# Patient Record
Sex: Male | Born: 1950
Health system: Southern US, Community
[De-identification: ages and names within clinical notes are randomized; demographics above are authoritative.]

## PROBLEM LIST (undated history)

## (undated) DIAGNOSIS — Z8619 Personal history of other infectious and parasitic diseases: Secondary | ICD-10-CM

## (undated) DIAGNOSIS — I10 Essential (primary) hypertension: Secondary | ICD-10-CM

## (undated) DIAGNOSIS — R0683 Snoring: Secondary | ICD-10-CM

## (undated) DIAGNOSIS — I5022 Chronic systolic (congestive) heart failure: Secondary | ICD-10-CM

## (undated) DIAGNOSIS — I429 Cardiomyopathy, unspecified: Secondary | ICD-10-CM

## (undated) DIAGNOSIS — I272 Pulmonary hypertension, unspecified: Secondary | ICD-10-CM

## (undated) DIAGNOSIS — IMO0002 Reserved for concepts with insufficient information to code with codable children: Secondary | ICD-10-CM

## (undated) DIAGNOSIS — D696 Thrombocytopenia, unspecified: Secondary | ICD-10-CM

## (undated) DIAGNOSIS — R9430 Abnormal result of cardiovascular function study, unspecified: Secondary | ICD-10-CM

## (undated) DIAGNOSIS — B029 Zoster without complications: Secondary | ICD-10-CM

## (undated) HISTORY — DX: Snoring: R06.83

## (undated) HISTORY — DX: Cardiomyopathy, unspecified: I42.9

## (undated) HISTORY — DX: Thrombocytopenia, unspecified: D69.6

## (undated) HISTORY — DX: Zoster without complications: B02.9

## (undated) HISTORY — DX: Pulmonary hypertension, unspecified: I27.20

## (undated) HISTORY — PX: OTHER SURGICAL HISTORY: SHX169

## (undated) HISTORY — DX: Personal history of other infectious and parasitic diseases: Z86.19

## (undated) HISTORY — DX: Abnormal result of cardiovascular function study, unspecified: R94.30

## (undated) HISTORY — DX: Chronic systolic (congestive) heart failure: I50.22

## (undated) HISTORY — DX: Reserved for concepts with insufficient information to code with codable children: IMO0002

---

## 2001-07-04 ENCOUNTER — Emergency Department (HOSPITAL_COMMUNITY): Admission: EM | Admit: 2001-07-04 | Discharge: 2001-07-04 | Payer: Self-pay | Admitting: Emergency Medicine

## 2013-02-04 ENCOUNTER — Encounter (HOSPITAL_COMMUNITY): Payer: Self-pay | Admitting: Emergency Medicine

## 2013-02-04 ENCOUNTER — Inpatient Hospital Stay (HOSPITAL_COMMUNITY)
Admission: EM | Admit: 2013-02-04 | Discharge: 2013-02-08 | DRG: 292 | Disposition: A | Discharge status: Home / Self Care | Payer: Medicaid Other | Attending: Internal Medicine | Admitting: Internal Medicine

## 2013-02-04 ENCOUNTER — Emergency Department (HOSPITAL_COMMUNITY): Payer: Medicaid Other

## 2013-02-04 DIAGNOSIS — Z87891 Personal history of nicotine dependence: Secondary | ICD-10-CM

## 2013-02-04 DIAGNOSIS — Z91199 Patient's noncompliance with other medical treatment and regimen due to unspecified reason: Secondary | ICD-10-CM

## 2013-02-04 DIAGNOSIS — L02619 Cutaneous abscess of unspecified foot: Secondary | ICD-10-CM | POA: Diagnosis present

## 2013-02-04 DIAGNOSIS — D696 Thrombocytopenia, unspecified: Secondary | ICD-10-CM | POA: Diagnosis present

## 2013-02-04 DIAGNOSIS — I5021 Acute systolic (congestive) heart failure: Secondary | ICD-10-CM

## 2013-02-04 DIAGNOSIS — I252 Old myocardial infarction: Secondary | ICD-10-CM

## 2013-02-04 DIAGNOSIS — I4510 Unspecified right bundle-branch block: Secondary | ICD-10-CM

## 2013-02-04 DIAGNOSIS — Z9119 Patient's noncompliance with other medical treatment and regimen: Secondary | ICD-10-CM

## 2013-02-04 DIAGNOSIS — I509 Heart failure, unspecified: Secondary | ICD-10-CM | POA: Diagnosis present

## 2013-02-04 DIAGNOSIS — I519 Heart disease, unspecified: Secondary | ICD-10-CM | POA: Diagnosis present

## 2013-02-04 DIAGNOSIS — L0390 Cellulitis, unspecified: Secondary | ICD-10-CM

## 2013-02-04 DIAGNOSIS — I452 Bifascicular block: Secondary | ICD-10-CM | POA: Diagnosis present

## 2013-02-04 DIAGNOSIS — I110 Hypertensive heart disease with heart failure: Principal | ICD-10-CM | POA: Diagnosis present

## 2013-02-04 DIAGNOSIS — I5023 Acute on chronic systolic (congestive) heart failure: Secondary | ICD-10-CM | POA: Diagnosis present

## 2013-02-04 DIAGNOSIS — I10 Essential (primary) hypertension: Secondary | ICD-10-CM

## 2013-02-04 DIAGNOSIS — I420 Dilated cardiomyopathy: Secondary | ICD-10-CM

## 2013-02-04 DIAGNOSIS — G4733 Obstructive sleep apnea (adult) (pediatric): Secondary | ICD-10-CM | POA: Diagnosis present

## 2013-02-04 DIAGNOSIS — I272 Pulmonary hypertension, unspecified: Secondary | ICD-10-CM | POA: Diagnosis present

## 2013-02-04 DIAGNOSIS — E669 Obesity, unspecified: Secondary | ICD-10-CM | POA: Diagnosis present

## 2013-02-04 DIAGNOSIS — I428 Other cardiomyopathies: Secondary | ICD-10-CM | POA: Diagnosis present

## 2013-02-04 HISTORY — DX: Essential (primary) hypertension: I10

## 2013-02-04 LAB — COMPREHENSIVE METABOLIC PANEL
ALT: 25 U/L (ref 0–53)
AST: 22 U/L (ref 0–37)
Albumin: 3.2 g/dL — ABNORMAL LOW (ref 3.5–5.2)
Alkaline Phosphatase: 97 U/L (ref 39–117)
BUN: 18 mg/dL (ref 6–23)
CO2: 29 mEq/L (ref 19–32)
Chloride: 108 mEq/L (ref 96–112)
GFR calc Af Amer: 82 mL/min — ABNORMAL LOW (ref >90)
GFR calc non Af Amer: 71 mL/min — ABNORMAL LOW (ref >90)
Glucose, Bld: 123 mg/dL — ABNORMAL HIGH (ref 70–99)
Potassium: 4 mEq/L (ref 3.5–5.1)
Total Bilirubin: 0.7 mg/dL (ref 0.3–1.2)

## 2013-02-04 LAB — PRO B NATRIURETIC PEPTIDE: Pro B Natriuretic peptide (BNP): 9137 pg/mL — ABNORMAL HIGH (ref 0–125)

## 2013-02-04 LAB — CBC WITH DIFFERENTIAL/PLATELET
HCT: 41 % (ref 39.0–52.0)
Hemoglobin: 12.9 g/dL — ABNORMAL LOW (ref 13.0–17.0)
Lymphocytes Relative: 13 % (ref 12–46)
Lymphs Abs: 0.6 10*3/uL — ABNORMAL LOW (ref 0.7–4.0)
MCH: 25.8 pg — ABNORMAL LOW (ref 26.0–34.0)
Monocytes Absolute: 0.4 10*3/uL (ref 0.1–1.0)
Monocytes Relative: 10 % (ref 3–12)
Neutro Abs: 3.1 10*3/uL (ref 1.7–7.7)
Neutrophils Relative %: 75 % (ref 43–77)
RBC: 5 MIL/uL (ref 4.22–5.81)
WBC: 4.1 10*3/uL (ref 4.0–10.5)

## 2013-02-04 LAB — POCT I-STAT TROPONIN I: Troponin i, poc: 0.02 ng/mL (ref 0.00–0.08)

## 2013-02-04 MED ORDER — FUROSEMIDE 10 MG/ML IJ SOLN
40.0000 mg | Freq: Once | INTRAMUSCULAR | Status: AC
  Administered 2013-02-04: 40 mg via INTRAVENOUS
  Filled 2013-02-04: qty 4

## 2013-02-04 MED ORDER — ASPIRIN 325 MG PO TABS
325.0000 mg | ORAL_TABLET | Freq: Every day | ORAL | Status: DC
  Administered 2013-02-05 – 2013-02-08 (×5): 325 mg via ORAL
  Filled 2013-02-04 (×5): qty 1

## 2013-02-04 MED ORDER — LABETALOL HCL 5 MG/ML IV SOLN: 10.0000 mg | Freq: Once | INTRAVENOUS | Status: DC

## 2013-02-04 NOTE — ED Notes (Signed)
Patient transported from xray 

## 2013-02-04 NOTE — ED Notes (Signed)
MD at bedside. 

## 2013-02-04 NOTE — ED Provider Notes (Signed)
CSN: [id number]     Arrival date & time 02/04/13  2115 History   First MD Initiated Contact with Patient 02/04/13 2150     Chief Complaint  Patient presents with  . Shortness of Breath   (Consider location/radiation/quality/duration/timing/severity/associated sxs/prior Treatment) Patient is a 62 y.o. male presenting with shortness of breath.  Shortness of Breath Severity:  Moderate Onset quality:  Gradual Duration:  3 weeks Timing:  Constant Progression:  Worsening Chronicity:  New Context: activity   Relieved by:  Nothing Associated symptoms: no abdominal pain, no chest pain, no cough, no diaphoresis, no fever, no headaches, no neck pain, no rash, no sore throat and no vomiting   Risk factors: obesity     Past Medical History  Diagnosis Date  . Hypertension    History reviewed. No pertinent past surgical history. History reviewed. No pertinent family history. History  Substance Use Topics  . Smoking status: Former Games developer  . Smokeless tobacco: Not on file  . Alcohol Use: No    Review of Systems  Constitutional: Negative for fever, chills and diaphoresis.  HENT: Negative for sore throat.   Eyes: Negative for pain.  Respiratory: Positive for shortness of breath. Negative for cough.   Cardiovascular: Positive for leg swelling. Negative for chest pain.  Gastrointestinal: Positive for abdominal distention. Negative for nausea, vomiting and abdominal pain.  Genitourinary: Negative for dysuria and flank pain.  Musculoskeletal: Negative for back pain and neck pain.  Skin: Negative for rash.  Neurological: Negative for seizures and headaches.    Allergies  Review of patient's allergies indicates no known allergies.  Home Medications   Current Outpatient Rx  Name  Route  Sig  Dispense  Refill  . naproxen sodium (ANAPROX) 220 MG tablet   Oral   Take 440 mg by mouth daily as needed (pain).          BP 166/84  Pulse 82  Temp(Src) 98.6 F (37 C) (Oral)  Resp 26   Wt 250 lb (113.399 kg)  SpO2 100% Physical Exam  Constitutional: He is oriented to person, place, and time. He appears well-developed and well-nourished. No distress.  HENT:  Head: Normocephalic and atraumatic.  Eyes: Pupils are equal, round, and reactive to light.  Neck: Normal range of motion.  Cardiovascular: Normal rate and regular rhythm.  Exam reveals distant heart sounds.   Pulmonary/Chest: Effort normal. Not tachypneic and not bradypneic. He has rales in the right lower field and the left lower field.  Abdominal: Soft. He exhibits distension. There is no tenderness.  Musculoskeletal: Normal range of motion.  Neurological: He is alert and oriented to person, place, and time.  Skin: Skin is warm. He is not diaphoretic.    ED Course  Procedures (including critical care time) Labs Review Labs Reviewed  PRO B NATRIURETIC PEPTIDE - Abnormal; Notable for the following:    Pro B Natriuretic peptide (BNP) 9137.0 (*)    All other components within normal limits  CBC WITH DIFFERENTIAL - Abnormal; Notable for the following:    Hemoglobin 12.9 (*)    MCH 25.8 (*)    RDW 15.9 (*)    Platelets 91 (*)    Lymphs Abs 0.6 (*)    All other components within normal limits  COMPREHENSIVE METABOLIC PANEL - Abnormal; Notable for the following:    Glucose, Bld 123 (*)    Albumin 3.2 (*)    GFR calc non Af Amer 71 (*)    GFR calc Af Amer 82 (*)  All other components within normal limits  POCT I-STAT TROPONIN I   Imaging Review Dg Chest 2 View  02/04/2013   CLINICAL DATA:  Shortness of breath, cough.  EXAM: CHEST  2 VIEW  COMPARISON:  None.  FINDINGS: Mild cardiomegaly is noted. Elevated right hemidiaphragm is noted with probable associated mild pleural effusion. Right lower lobe opacity is noted concerning for subsegmental atelectasis or pneumonia. No pneumothorax is noted. Bony thorax is intact.  IMPRESSION: Right basilar opacity consistent with pneumonia or subsegmental atelectasis with  associated pleural effusion.   Electronically Signed   By: Roque Lias M.D.   On: 02/04/2013 22:00    EKG Interpretation   None       MDM   1. CHF exacerbation    62 yo M with hx of HTN presents with 3 weeks of DOE, shortness of breath, leg swelling, concerning for new onset CHF.   PE as above, significant for distant heart sounds and bilateral leg swelling. Will obtain labs to check for CHF.   Patient with increased BNP, mild cardiomegaly, concerning for new onset CHF. Hospitalist consulted for admission for continued workup. Will treat with IV lasix, BB, aspirin, will admit. Admitted to floor in stable condition. Patient seen and evaluated by myself and my attending, Dr. Rubin Payor.        Imagene Sheller, MD 02/05/13 [phone]

## 2013-02-04 NOTE — ED Notes (Signed)
Pt c/o shortness of breath with exertion x's 2-3 weeks.  St's has had bil leg edema over past 2 weeks.  PT denies any chest pain

## 2013-02-04 NOTE — ED Notes (Signed)
Patient transported to X-ray 

## 2013-02-05 ENCOUNTER — Encounter (HOSPITAL_COMMUNITY): Payer: Self-pay | Admitting: Internal Medicine

## 2013-02-05 DIAGNOSIS — I10 Essential (primary) hypertension: Secondary | ICD-10-CM | POA: Diagnosis present

## 2013-02-05 DIAGNOSIS — G4733 Obstructive sleep apnea (adult) (pediatric): Secondary | ICD-10-CM | POA: Diagnosis present

## 2013-02-05 DIAGNOSIS — E669 Obesity, unspecified: Secondary | ICD-10-CM | POA: Diagnosis present

## 2013-02-05 DIAGNOSIS — Z9119 Patient's noncompliance with other medical treatment and regimen: Secondary | ICD-10-CM

## 2013-02-05 DIAGNOSIS — I509 Heart failure, unspecified: Secondary | ICD-10-CM | POA: Diagnosis present

## 2013-02-05 DIAGNOSIS — I059 Rheumatic mitral valve disease, unspecified: Secondary | ICD-10-CM

## 2013-02-05 MED ORDER — LISINOPRIL 20 MG PO TABS
20.0000 mg | ORAL_TABLET | Freq: Every day | ORAL | Status: DC
  Administered 2013-02-06 – 2013-02-08 (×3): 20 mg via ORAL
  Filled 2013-02-05 (×4): qty 1

## 2013-02-05 MED ORDER — ONDANSETRON HCL 4 MG/2ML IJ SOLN: 4.0000 mg | INTRAMUSCULAR | Status: DC | PRN

## 2013-02-05 MED ORDER — SENNOSIDES-DOCUSATE SODIUM 8.6-50 MG PO TABS
1.0000 | ORAL_TABLET | Freq: Two times a day (BID) | ORAL | Status: DC
  Administered 2013-02-05 – 2013-02-08 (×5): 1 via ORAL
  Filled 2013-02-05 (×8): qty 1

## 2013-02-05 MED ORDER — CIPROFLOXACIN IN D5W 400 MG/200ML IV SOLN
400.0000 mg | Freq: Two times a day (BID) | INTRAVENOUS | Status: DC
  Administered 2013-02-05 – 2013-02-07 (×5): 400 mg via INTRAVENOUS
  Filled 2013-02-05 (×6): qty 200

## 2013-02-05 MED ORDER — PNEUMOCOCCAL VAC POLYVALENT 25 MCG/0.5ML IJ INJ
0.5000 mL | INJECTION | Freq: Once | INTRAMUSCULAR | Status: AC
  Administered 2013-02-05: 11:00:00 0.5 mL via INTRAMUSCULAR
  Filled 2013-02-05: qty 0.5

## 2013-02-05 MED ORDER — CARVEDILOL 6.25 MG PO TABS
6.2500 mg | ORAL_TABLET | Freq: Two times a day (BID) | ORAL | Status: DC
  Administered 2013-02-05 – 2013-02-08 (×6): 6.25 mg via ORAL
  Filled 2013-02-05 (×7): qty 1

## 2013-02-05 MED ORDER — HYDRALAZINE HCL 20 MG/ML IJ SOLN: 10.0000 mg | Freq: Four times a day (QID) | INTRAMUSCULAR | Status: DC | PRN

## 2013-02-05 MED ORDER — LISINOPRIL 10 MG PO TABS
10.0000 mg | ORAL_TABLET | Freq: Every day | ORAL | Status: DC
  Administered 2013-02-05: 11:00:00 10 mg via ORAL
  Filled 2013-02-05: qty 1

## 2013-02-05 MED ORDER — ONDANSETRON HCL 4 MG PO TABS: 4.0000 mg | ORAL_TABLET | ORAL | Status: DC | PRN

## 2013-02-05 MED ORDER — HEPARIN SODIUM (PORCINE) 5000 UNIT/ML IJ SOLN
5000.0000 [IU] | Freq: Two times a day (BID) | INTRAMUSCULAR | Status: DC
  Administered 2013-02-05 – 2013-02-06 (×3): 5000 [IU] via SUBCUTANEOUS
  Filled 2013-02-05 (×4): qty 1

## 2013-02-05 MED ORDER — PERFLUTREN LIPID MICROSPHERE
1.0000 mL | INTRAVENOUS | Status: AC | PRN
  Administered 2013-02-05: 2 mL via INTRAVENOUS
  Filled 2013-02-05: qty 10

## 2013-02-05 MED ORDER — FUROSEMIDE 10 MG/ML IJ SOLN
40.0000 mg | Freq: Two times a day (BID) | INTRAMUSCULAR | Status: DC
  Administered 2013-02-05 – 2013-02-07 (×5): 40 mg via INTRAVENOUS
  Filled 2013-02-05 (×8): qty 4

## 2013-02-05 MED ORDER — LABETALOL HCL 5 MG/ML IV SOLN
20.0000 mg | Freq: Once | INTRAVENOUS | Status: AC
  Administered 2013-02-05: 20 mg via INTRAVENOUS
  Filled 2013-02-05: qty 4

## 2013-02-05 MED ORDER — INFLUENZA VIRUS VACC SPLIT PF IM SUSP
0.5000 mL | Freq: Once | INTRAMUSCULAR | Status: AC
  Administered 2013-02-05: 10:00:00 0.5 mL via INTRAMUSCULAR
  Filled 2013-02-05: qty 0.5

## 2013-02-05 MED ORDER — POTASSIUM CHLORIDE ER 10 MEQ PO TBCR
10.0000 meq | EXTENDED_RELEASE_TABLET | Freq: Every day | ORAL | Status: DC
  Administered 2013-02-05 – 2013-02-08 (×4): 10 meq via ORAL
  Filled 2013-02-05 (×4): qty 1

## 2013-02-05 MED ORDER — CARVEDILOL 3.125 MG PO TABS
3.1250 mg | ORAL_TABLET | Freq: Two times a day (BID) | ORAL | Status: DC
  Administered 2013-02-05: 3.125 mg via ORAL
  Filled 2013-02-05 (×2): qty 1

## 2013-02-05 NOTE — Progress Notes (Signed)
Echocardiogram 2D Echocardiogram has been performed.  Jesse Torres 02/05/2013, 3:17 PM

## 2013-02-05 NOTE — Progress Notes (Signed)
[facility] PROGRESS NOTE    Jesse Torres ION:[id number] DOB: 1950-08-31 DOA: 02/04/2013 PCP: No PCP Per Patient  HPI/Brief narrative 62 y.o. male with hx of severe HTN, noncompliance with medical care due to lack of insurance, obesity, loud snoring, prior tobacco use, presents to the ER with several weeks of shortness of breath, orthopnea, PND, and peripheral edema. Evalation in the ER included a CXR which showed ? Infiltrate vs atelectasis with pleural effusion, BNP of 9137, normal WBC and renal fx tests, with normal LFTs. He also has mild redness and tenderness of his lower extremities. His EKG showed no acute ST T changes, and he is in SR with bifascicular blocks. He was found to be quite hypertensive in the ER, with BP 180/110. IV Lasix, oxygen, and IV labetelol were given with improvement of his symptoms. Hospitalist was asked to admit him for new onset of CHF. He has no chest pain.  Assessment/Plan:  Acute CHF, unknown type (no prior echo) - Etiology: Possible hypertensive heart disease/cardiomyopathy, rule out sleep apnea - Patient admitted to telemetry and started on IV Lasix, low dose beta blockers, ACE inhibitor and nitrates. - Clinically improved but still significantly volume overloaded. - Followup 2-D echo  Uncontrolled hypertension - Titrate carvedilol and lisinopril and monitor.  Possible cellulitis of both feet - Continue empiric Cipro.  Rule out sleep apnea - Will need formal sleep study as outpatient.  Thrombocytopenia - Follow CBCs   Code Status: Full Family Communication: Discussed with daughter at bedside. Disposition Plan: Home when medically stable   Consultants:  None  Procedures:  None  Antibiotics:  IV Cipro 12/24 >   Subjective: States that he feels significantly better. Denies dyspnea or chest pain. Indicates that leg swellings on improving.  Objective: Filed Vitals:   02/05/13 0315 02/05/13 0324 02/05/13 0415 02/05/13 1548   BP: 142/73  157/75 174/106  Pulse: 65  73 75  Temp:  98 F (36.7 C) 97.5 F (36.4 C) 97.5 F (36.4 C)  TempSrc:  Oral Oral Oral  Resp: 22  18 17   Height:   5\' 7"  (1.702 m)   Weight:   109.7 kg (241 lb 13.5 oz)   SpO2: 100% 100% 95% 95%    Intake/Output Summary (Last 24 hours) at 02/05/13 1614 Last data filed at 02/05/13 1550  Gross per 24 hour  Intake    580 ml  Output   3275 ml  Net  -2695 ml   Filed Weights   02/04/13 2128 02/05/13 0415  Weight: 113.399 kg (250 lb) 109.7 kg (241 lb 13.5 oz)     Exam:  General exam: Middle-aged male, obese lying comfortably supine in bed. Respiratory system: Occasional basal crackles but otherwise rest of lung fields are clear to auscultation. No increased work of breathing. Cardiovascular system: S1 & S2 heard, RRR. No JVD, murmurs, gallops, clicks. 2+ pitting bilateral leg edema with mild erythema without warmth or tenderness of bilateral feet. Gastrointestinal system: Abdomen is nondistended, soft and nontender. Normal bowel sounds heard. Central nervous system: Alert and oriented. No focal neurological deficits. Extremities: Symmetric 5 x 5 power.   Data Reviewed: Basic Metabolic Panel:  Recent Labs Lab 02/04/13 2216  NA 144  K 4.0  CL 108  CO2 29  GLUCOSE 123*  BUN 18  CREATININE 1.09  CALCIUM 8.8   Liver Function Tests:  Recent Labs Lab 02/04/13 2216  AST 22  ALT 25  ALKPHOS 97  BILITOT 0.7  PROT 6.1  ALBUMIN 3.2*  No results found for this basename: LIPASE, AMYLASE,  in the last 168 hours No results found for this basename: AMMONIA,  in the last 168 hours CBC:  Recent Labs Lab 02/04/13 2216  WBC 4.1  NEUTROABS 3.1  HGB 12.9*  HCT 41.0  MCV 82.0  PLT 91*   Cardiac Enzymes: No results found for this basename: CKTOTAL, CKMB, CKMBINDEX, TROPONINI,  in the last 168 hours BNP (last 3 results)  Recent Labs  02/04/13 2216  PROBNP 9137.0*   CBG: No results found for this basename: GLUCAP,  in  the last 168 hours  No results found for this or any previous visit (from the past 240 hour(s)).    Additional labs: 1. None     Studies: Dg Chest 2 View  02/04/2013   CLINICAL DATA:  Shortness of breath, cough.  EXAM: CHEST  2 VIEW  COMPARISON:  None.  FINDINGS: Mild cardiomegaly is noted. Elevated right hemidiaphragm is noted with probable associated mild pleural effusion. Right lower lobe opacity is noted concerning for subsegmental atelectasis or pneumonia. No pneumothorax is noted. Bony thorax is intact.  IMPRESSION: Right basilar opacity consistent with pneumonia or subsegmental atelectasis with associated pleural effusion.   Electronically Signed   By: Roque Lias M.D.   On: 02/04/2013 22:00        Scheduled Meds: . aspirin  325 mg Oral Daily  . carvedilol  3.125 mg Oral BID  . ciprofloxacin  400 mg Intravenous Q12H  . furosemide  40 mg Intravenous Q12H  . heparin  5,000 Units Subcutaneous Q12H  . lisinopril  10 mg Oral Daily  . potassium chloride  10 mEq Oral Daily  . senna-docusate  1 tablet Oral BID   Continuous Infusions:   Principal Problem:   CHF with unknown LVEF Active Problems:   HTN (hypertension)   Sleep apnea, obstructive   Obesity   History of noncompliance with medical treatment    Time spent: 25 minutes.    Marcellus Scott, MD, FACP, FHM. [facility] Pager [phone]  If 7PM-7AM, please contact night-coverage [email or web address] Password [email or web address] 02/05/2013, 4:14 PM    LOS: 1 day

## 2013-02-05 NOTE — Plan of Care (Signed)
Problem: Phase I Progression Outcomes Goal: EF % per last Echo/documented,Core Reminder form on chart Outcome: Completed/Met Date Met:  02/05/13 pts EF from 02/05/2013 25-30%

## 2013-02-05 NOTE — H&P (Signed)
[facility] History and Physical  Jesse Torres ZOX:[id number] DOB: [birth year]    PCP:   NONE.  Chief Complaint: shortness of breath for the past several weeks.  HPI: Jesse Torres is an 62 y.o. male with hx of severe HTN, noncompliance with medical care due to lack of insurance, obesity, loud snoring, prior tobacco use, presents to the ER with several weeks of shortness of breath, orthopnea, PND, and peripheral edema.  Evalation in the ER included a CXR which showed ? Infiltrate vs atelectasis with pleural effusion, BNP of 9137, normal WBC and renal fx tests, with normal LFTs.  He also has mild redness and tenderness of his lower extremities.  His EKG showed no acute ST T changes, and he is in SR with bifascicular blocks.  He was found to be quite hypertensive in the ER, with BP 180/110.  IV Lasix, oxygen, and IV labetelol were given with improvement of his symptoms.  Hospitalist was asked to admit him for new onset of CHF.  He has no chest pain.  Rewiew of Systems:  Constitutional: Negative for malaise, fever and chills. No significant weight loss or weight gain Eyes: Negative for eye pain, redness and discharge, diplopia, visual changes, or flashes of light. ENMT: Negative for ear pain, hoarseness, nasal congestion, sinus pressure and sore throat. No headaches; tinnitus, drooling, or problem swallowing. Cardiovascular: Negative for chest pain, palpitations, diaphoresis, Respiratory: Negative for cough, hemoptysis, wheezing and stridor. No pleuritic chestpain. Gastrointestinal: Negative for nausea, vomiting, diarrhea, constipation, abdominal pain, melena, blood in stool, hematemesis, jaundice and rectal bleeding.    Genitourinary: Negative for frequency, dysuria, incontinence,flank pain and hematuria; Musculoskeletal: Negative for back pain and neck pain. Negative for trauma.;  Skin: . Negative for pruritus, rash, abrasions, bruising and skin lesion.; ulcerations Neuro: Negative for  headache, lightheadedness and neck stiffness. Negative for weakness, altered level of consciousness , altered mental status, extremity weakness, burning feet, involuntary movement, seizure and syncope.  Psych: negative for anxiety, depression, insomnia, tearfulness, panic attacks, hallucinations, paranoia, suicidal or homicidal ideation    Past Medical History  Diagnosis Date  . Hypertension     History reviewed. No pertinent past surgical history.  Medications:  HOME MEDS: Prior to Admission medications   Medication Sig Start Date End Date Taking? Authorizing Provider  naproxen sodium (ANAPROX) 220 MG tablet Take 440 mg by mouth daily as needed (pain).   Yes Historical Provider, MD     Allergies:  No Known Allergies  Social History:   reports that he has quit smoking. He does not have any smokeless tobacco history on file. He reports that he does not drink alcohol or use illicit drugs.  Family History: History reviewed. No pertinent family history.   Physical Exam: Filed Vitals:   02/04/13 2300 02/04/13 2315 02/05/13 0000 02/05/13 0044  BP: 162/92 155/96 164/91 128/54  Pulse: 82 81 85 66  Temp:      TempSrc:      Resp: 21 32 25 20  Weight:      SpO2: 100% 100% 99% 100%   Blood pressure 128/54, pulse 66, temperature 98.6 F (37 C), temperature source Oral, resp. rate 20, weight 113.399 kg (250 lb), SpO2 100.00%.  GEN:  Pleasant patient lying in the stretcher in no acute distress; cooperative with exam. PSYCH:  alert and oriented x4; does not appear anxious or depressed; affect is appropriate. HEENT: Mucous membranes pink and anicteric; PERRLA; EOM intact; no cervical lymphadenopathy nor thyromegaly or carotid bruit; +JVD; There  were no stridor. Neck is very supple. Breasts:: Not examined CHEST WALL: No tenderness CHEST: Normal respiration, no wheezing, but bisilar rales, decrease BS. HEART: Regular rate and rhythm.  There are no murmur, rub, or gallops.   BACK: No  kyphosis or scoliosis; no CVA tenderness ABDOMEN: soft and non-tender; no masses, no organomegaly, normal abdominal bowel sounds; no pannus; no intertriginous candida. There is no rebound and no distention. Obse abdomen. Rectal Exam: Not done EXTREMITIES: No bone or joint deformity; age-appropriate arthropathy of the hands and knees;no ulcerations.  There is no calf tenderness. Genitalia: not examined PULSES: 2+ and symmetric SKIN: Normal hydration.  He has increased redness of his lower extremities CNS: Cranial nerves 2-12 grossly intact no focal lateralizing neurologic deficit.  Speech is fluent; uvula elevated with phonation, facial symmetry and tongue midline. DTR are normal bilaterally, cerebella exam is intact, barbinski is negative and strengths are equaled bilaterally.  No sensory loss.   Labs on Admission:  Basic Metabolic Panel:  Recent Labs Lab 02/04/13 2216  NA 144  K 4.0  CL 108  CO2 29  GLUCOSE 123*  BUN 18  CREATININE 1.09  CALCIUM 8.8   Liver Function Tests:  Recent Labs Lab 02/04/13 2216  AST 22  ALT 25  ALKPHOS 97  BILITOT 0.7  PROT 6.1  ALBUMIN 3.2*   No results found for this basename: LIPASE, AMYLASE,  in the last 168 hours No results found for this basename: AMMONIA,  in the last 168 hours CBC:  Recent Labs Lab 02/04/13 2216  WBC 4.1  NEUTROABS 3.1  HGB 12.9*  HCT 41.0  MCV 82.0  PLT 91*   Cardiac Enzymes: No results found for this basename: CKTOTAL, CKMB, CKMBINDEX, TROPONINI,  in the last 168 hours  CBG: No results found for this basename: GLUCAP,  in the last 168 hours   Radiological Exams on Admission: Dg Chest 2 View  02/04/2013   CLINICAL DATA:  Shortness of breath, cough.  EXAM: CHEST  2 VIEW  COMPARISON:  None.  FINDINGS: Mild cardiomegaly is noted. Elevated right hemidiaphragm is noted with probable associated mild pleural effusion. Right lower lobe opacity is noted concerning for subsegmental atelectasis or pneumonia. No  pneumothorax is noted. Bony thorax is intact.  IMPRESSION: Right basilar opacity consistent with pneumonia or subsegmental atelectasis with associated pleural effusion.   Electronically Signed   By: Roque Lias M.D.   On: 02/04/2013 22:00    EKG: Independently reviewed. SR no acute ST T changes.  He has bifascicular block.   Assessment/Plan Present on Admission:  . CHF with unknown LVEF . HTN (hypertension) . Sleep apnea, obstructive . Obesity  PLAN:  I suspect his CHF is on the bases of HTN CMP.  He likely has sleep apnea as well given his BMI along with his hx of loud snoring.  Will admit him to telemetry.  Start low dose betablocker, ACE-I, and nitrate to control his BP.  At some point, he will need to have a polysomnogram as well.  Will obtain daily weight and strict I/O.  He will need an ECHO with contrast. Clinically, he did not present like PNA, so no antibiotics will be started.  I discussed long term complication of HTN and sleep apnea, and he expressed understanding.  He also has cellulitis of both feet and will be given IV antibiotics.  He is stable, full code, and will be admitted to [facility] service.  Thank you for allowing me to participate in the  care of your nice patient.  Other plans as per orders.  Code Status: FULL Unk Lightning, MD. [facility] Pager [phone] 7pm to 7am.  02/05/2013, 1:09 AM

## 2013-02-05 NOTE — Plan of Care (Signed)
Problem: Food- and Nutrition-Related Knowledge Deficit (NB-1.1) Goal: Nutrition education Formal process to instruct or train a patient/client in a skill or to impart knowledge to help patients/clients voluntarily manage or modify food choices and eating behavior to maintain or improve health. Outcome: Completed/Met Date Met:  02/05/13 Nutrition Education Note  RD consulted for nutrition education regarding CHF.  RD provided "Low Sodium Nutrition Therapy" handout from the Academy of Nutrition and Dietetics. Reviewed patient's dietary recall. Provided examples on ways to decrease sodium intake in diet. Discouraged intake of processed foods and use of salt shaker. Encouraged fresh fruits and vegetables as well as whole grain sources of carbohydrates to maximize fiber intake.   RD discussed why it is important for patient to adhere to diet recommendations, and emphasized the role of fluids, foods to avoid, and importance of weighing self daily. Teach back method used.  Expect fair compliance.  Body mass index is 37.87 kg/(m^2). Pt meets criteria for Obese Class II based on current BMI.  Current diet order is Heart Healthy, patient is consuming approximately 100% of meals at this time. Labs and medications reviewed. No further nutrition interventions warranted at this time. RD contact information provided. If additional nutrition issues arise, please re-consult RD.   Jarold Motto MS, RD, LDN Pager: [phone] After-hours pager: [phone]

## 2013-02-05 NOTE — Care Management Note (Addendum)
  Page 2 of 2   02/07/2013     11:15:13 AM   CARE MANAGEMENT NOTE 02/07/2013  Patient:  Jesse Torres,Jesse Torres   Account Number:  [id number]  Date Initiated:  02/05/2013  Documentation initiated by:  [facility]  Subjective/Objective Assessment:   62 y.o. male with hx of severe HTN, noncompliance with medical care due to lack of insurance, obesity, loud snoring, prior tobacco use, presents to the ER with several weeks of SOB, orthopnea, PND, and peripheral edema. //Home alone     Action/Plan:   IV diureses//Home with HH   Anticipated DC Date:  02/08/2013   Anticipated DC Plan:  HOME W HOME HEALTH SERVICES  In-house referral  Clinical Social Worker  Artist      DC Planning Services  CM consult  Other  Medication Assistance      Choice offered to / List presented to:             Status of service:  Completed, signed off Medicare Important Message given?   (If response is "NO", the following Medicare IM given date fields will be blank) Date Medicare IM given:   Date Additional Medicare IM given:    Discharge Disposition:    Per UR Regulation:  Reviewed for med. necessity/level of care/duration of stay  If discussed at Long Length of Stay Meetings, dates discussed:    Comments:  02/07/2013  Jesse Scheu RN, BSN, MSHL, CCM MCD Pending:  [facility] [employer], Marcelino Duster has met with patient and discussed MCD and Disability applications. Med Assist:   Patient confirms ability to cover medication cost post d/c home.  Currently most meds are on $4.00 list. PCP:  Initial appt scheduled 03/11/2012  Tuesday, 10:30am [facility] [address] [place] [address] [phone] Patient confirms ability to self manage all ADLs  and no DME needs.  Hx/o separated x 1 year but has supportive children living close by.  Patient states ability to self manage his own meds.  No further needs identified at this time. CM will continue to  monitor for disposition needs.     02/06/23 1015 Jesse Wood, RN, BSN, [place] [phone] Follow up with [facility] on 03/11/2013. (Tuesday @10 :30 AM).  NCM will continue to follow for medication needs and possible MATCH assistance.

## 2013-02-05 NOTE — Progress Notes (Signed)
Pt a/o, no c/o pain, pt educated on diet and doing daily weights, pt stable, will continue to monitor

## 2013-02-05 NOTE — Progress Notes (Signed)
Follow up with [facility] On 03/11/2013. (Tuesday @10 :30 AM)

## 2013-02-06 DIAGNOSIS — I4510 Unspecified right bundle-branch block: Secondary | ICD-10-CM

## 2013-02-06 DIAGNOSIS — I272 Pulmonary hypertension, unspecified: Secondary | ICD-10-CM | POA: Diagnosis present

## 2013-02-06 DIAGNOSIS — L0291 Cutaneous abscess, unspecified: Secondary | ICD-10-CM

## 2013-02-06 DIAGNOSIS — I519 Heart disease, unspecified: Secondary | ICD-10-CM | POA: Diagnosis present

## 2013-02-06 DIAGNOSIS — I5021 Acute systolic (congestive) heart failure: Secondary | ICD-10-CM

## 2013-02-06 DIAGNOSIS — I509 Heart failure, unspecified: Secondary | ICD-10-CM | POA: Diagnosis present

## 2013-02-06 LAB — BASIC METABOLIC PANEL
Calcium: 8.8 mg/dL (ref 8.4–10.5)
Creatinine, Ser: 1.21 mg/dL (ref 0.50–1.35)
GFR calc Af Amer: 72 mL/min — ABNORMAL LOW (ref >90)
Potassium: 3.8 mEq/L (ref 3.5–5.1)

## 2013-02-06 LAB — CBC
Hemoglobin: 14 g/dL (ref 13.0–17.0)
MCH: 25.6 pg — ABNORMAL LOW (ref 26.0–34.0)
Platelets: DECREASED 10*3/uL (ref 150–400)
RDW: 16.1 % — ABNORMAL HIGH (ref 11.5–15.5)
WBC: 3.5 10*3/uL — ABNORMAL LOW (ref 4.0–10.5)

## 2013-02-06 LAB — GLUCOSE, CAPILLARY: Glucose-Capillary: 153 mg/dL — ABNORMAL HIGH (ref 70–99)

## 2013-02-06 LAB — HEMOGLOBIN A1C
Hgb A1c MFr Bld: 6 % — ABNORMAL HIGH (ref <5.7)
Mean Plasma Glucose: 126 mg/dL — ABNORMAL HIGH (ref <117)

## 2013-02-06 MED ORDER — HEPARIN SODIUM (PORCINE) 5000 UNIT/ML IJ SOLN
5000.0000 [IU] | Freq: Three times a day (TID) | INTRAMUSCULAR | Status: DC
  Administered 2013-02-06 – 2013-02-08 (×6): 5000 [IU] via SUBCUTANEOUS
  Filled 2013-02-06 (×9): qty 1

## 2013-02-06 NOTE — Progress Notes (Signed)
Pt a/o, no c/o pain, pt verbalized understanding of daily weights and low sodium diet, vss, pt stable, will continue to monitor

## 2013-02-06 NOTE — Consult Note (Addendum)
CARDIOLOGY CONSULT NOTE   Patient ID: Jesse Torres MRN: [id number] DOB/AGE: 08/29/1950 62 y.o.  Admit Date: 02/04/2013  Primary Physician: Jesse Torres [place] [facility]    Jesse Torres   Clinical Summary Mr. Jesse Torres is a 62 y.o.male. He is admitted with congestive heart failure. He has significant hypertensive disease. He is improving with treatment. There is a history of noncompliance. We do not have a lot of old information in the current medical record. Echo has shown an ejection fraction of 25-30%. There is also right ventricular dysfunction that is moderate. There is also moderately severe pulmonary hypertension with a PA pressure of 64 mm of mercury. The patient has been nicely and evaluated and treated since admission by the primary team. He is diuresing.     No Known Allergies  Medications Scheduled Medications: . aspirin  325 mg Oral Daily  . carvedilol  6.25 mg Oral BID  . ciprofloxacin  400 mg Intravenous Q12H  . furosemide  40 mg Intravenous Q12H  . heparin  5,000 Units Subcutaneous Q12H  . lisinopril  20 mg Oral Daily  . potassium chloride  10 mEq Oral Daily  . senna-docusate  1 tablet Oral BID     Infusions:     PRN Medications:  hydrALAZINE, ondansetron (ZOFRAN) IV, ondansetron   Past Medical History  Diagnosis Date  . Hypertension     History reviewed. No pertinent past surgical history.  History reviewed. No pertinent family history.  Social History Mr. Jesse Torres reports that he quit smoking about 18 years ago. His smoking use included Cigarettes. He smoked 0.00 packs per day. He does not have any smokeless tobacco history on file. Mr. Jesse Torres reports that he does not drink alcohol.  Review of Systems  Patient denies fever, chills, headache, sweats, rash, change in vision, change in hearing, chest pain, cough, nausea vomiting, urinary symptoms. All other systems are reviewed and are negative.   Physical Examination Blood  pressure 123/60, pulse 75, temperature 97.7 F (36.5 C), temperature source Oral, resp. rate 18, height 5\' 7"  (1.702 m), weight 232 lb 14.4 oz (105.643 kg), SpO2 96.00%. The patient is oriented to person time and place. Affect is normal. There is no jugular venous distention. Lungs reveal scattered rhonchi and a few rales. There is no respiratory distress. Cardiac exam reveals S1 and S2. The abdomen is soft. There is 2+ peripheral edema. There are no musculoskeletal deformities. There are no skin rashes.     Intake/Output Summary (Last 24 hours) at 02/06/13 1106 Last data filed at 02/06/13 7062  Gross per 24 hour  Intake   1002 ml  Output   2550 ml  Net  -1548 ml    Telemetry:  I have reviewed telemetry today February 06, 2013. There is normal sinus rhythm.    Prior Cardiac Testing/Procedures  Lab Results  Basic Metabolic Panel:  Recent Labs Lab 02/04/13 2216 02/06/13 0510  NA 144 144  K 4.0 3.8  CL 108 104  CO2 29 31  GLUCOSE 123* 119*  BUN 18 18  CREATININE 1.09 1.21  CALCIUM 8.8 8.8    Liver Function Tests:  Recent Labs Lab 02/04/13 2216  AST 22  ALT 25  ALKPHOS 97  BILITOT 0.7  PROT 6.1  ALBUMIN 3.2*    CBC:  Recent Labs Lab 02/04/13 2216 02/06/13 0510  WBC 4.1 3.5*  NEUTROABS 3.1  --   HGB 12.9* 14.0  HCT 41.0 45.6  MCV 82.0 83.5  PLT  91* PLATELET CLUMPS NOTED ON SMEAR, COUNT APPEARS DECREASED    Cardiac Enzymes: No results found for this basename: CKTOTAL, CKMB, CKMBINDEX, TROPONINI,  in the last 168 hours  BNP: No components found with this basename: POCBNP,    Radiology: Dg Chest 2 View  02/04/2013   CLINICAL DATA:  Shortness of breath, cough.  EXAM: CHEST  2 VIEW  COMPARISON:  None.  FINDINGS: Mild cardiomegaly is noted. Elevated right hemidiaphragm is noted with probable associated mild pleural effusion. Right lower lobe opacity is noted concerning for subsegmental atelectasis or pneumonia. No pneumothorax is noted. Bony thorax is  intact.  IMPRESSION: Right basilar opacity consistent with pneumonia or subsegmental atelectasis with associated pleural effusion.   Electronically Signed   By: Roque Lias M.D.   On: 02/04/2013 22:00     ECG:  EKG shows right bundle branch block. There are inferior Q waves. There is no acute change.   Impression and Recommendations    HTN (hypertension)    Certainly his hypertension needs to be treated aggressively over time.    Sleep apnea, obstructive   Obesity   History of noncompliance with medical treatment    Congestive dilated cardiomyopathy     At this point we know that the patient has severe left ventricular dysfunction with an ejection fraction of 25%. It is likely that this is hypertensive disease. However he does have suggestion on EKG of myocardial infarctions in the past. I feel that we should screen him for the possibility of significant coronary disease. I'm recommending that we proceed with a nuclear stress study to rule out ischemia. This can be done while he is still in the hospital. If there is significant ischemia, catheterization should be done.     Right ventricular dysfunction    Pulmonary hypertension     PA pressure by echo was 65 mm of mercury.    Acute systolic CHF    The patient's presentation is with acute congestive heart failure. He is diuresing. He is been treated nicely with the appropriate medications. This needs to include diuretics, ACE inhibitors, beta blockers. Ultimately spironolactone needs to be added. However I would not start with this as compliance has been a significant issue for him. He needs further diuresis. He needs to be educated about limiting his salt and total fluid intake.   RBBB  EKG reveals right bundle branch block  The key to this man's therapy will be post hospital care. Before he leaves we need to have [facility] involved so that when the patient gets home there can be careful attention to the fact that he is taking his  medications in receiving his medications and come in for followup.  Jerral Bonito, MD

## 2013-02-06 NOTE — Progress Notes (Signed)
[facility] PROGRESS NOTE    Jesse Torres ZOX:[id number] DOB: May 18, 1950 DOA: 02/04/2013 PCP: No PCP Per Patient  HPI/Brief narrative 62 y.o. male with hx of severe HTN, noncompliance with medical care due to lack of insurance, obesity, loud snoring, prior tobacco use, presents to the ER with several weeks of shortness of breath, orthopnea, PND, and peripheral edema. Evalation in the ER included a CXR which showed ? Infiltrate vs atelectasis with pleural effusion, BNP of 9137, normal WBC and renal fx tests, with normal LFTs. He also has mild redness and tenderness of his lower extremities. His EKG showed no acute ST T changes, and he is in SR with bifascicular blocks. He was found to be quite hypertensive in the ER, with BP 180/110. IV Lasix, oxygen, and IV labetelol were given with improvement of his symptoms. Hospitalist was asked to admit him for new onset of CHF. He has no chest pain.  Assessment/Plan:  Acute systolic CHF/dilated cardiomyopathy/RBBB - Etiology: Possible hypertensive heart disease but need to rule out CAD. - Patient admitted to telemetry and started on IV Lasix, low dose beta blockers, ACE inhibitor and nitrates. - Clinically improved but still significantly volume overloaded. - 2-D echo shows EF 25% with hypokinesis. Cardiology consulted for assistance with further evaluation/workup and management. - Creatinine slightly higher. Follow BMP in a.m.  Uncontrolled hypertension - Titrate carvedilol and lisinopril and monitor. - Better.  Possible cellulitis of both feet - Continue empiric Cipro. - Improving.  Rule out sleep apnea - Will need formal sleep study as outpatient.  Thrombocytopenia - Follow CBCs   Code Status: Full Family Communication: None at bedside. Disposition Plan: Home when medically stable   Consultants:  Cardiology  Procedures:  None  Antibiotics:  IV Cipro 12/24 >   Subjective: Denies chest pain or dyspnea. Leg swellings  continue to improve.  Objective: Filed Vitals:   02/05/13 2028 02/06/13 0642 02/06/13 0710 02/06/13 0912  BP: 145/87 128/81  123/60  Pulse: 71 65  75  Temp: 97.9 F (36.6 C) 97.7 F (36.5 C)    TempSrc: Oral Oral    Resp: 20 18    Height:      Weight:   105.643 kg (232 lb 14.4 oz)   SpO2: 95% 96%      Intake/Output Summary (Last 24 hours) at 02/06/13 1243 Last data filed at 02/06/13 1132  Gross per 24 hour  Intake   1002 ml  Output   2800 ml  Net  -1798 ml   Filed Weights   02/04/13 2128 02/05/13 0415 02/06/13 0710  Weight: 113.399 kg (250 lb) 109.7 kg (241 lb 13.5 oz) 105.643 kg (232 lb 14.4 oz)     Exam:  General exam: Middle-aged male, obese lying comfortably supine in bed. Respiratory system: Occasional basal crackles but otherwise rest of lung fields are clear to auscultation. No increased work of breathing. Cardiovascular system: S1 & S2 heard, RRR. No JVD, murmurs, gallops, clicks. 1+ pitting bilateral leg edema with mild erythema (decreasing) without warmth or tenderness of bilateral feet. Gastrointestinal system: Abdomen is nondistended, soft and nontender. Normal bowel sounds heard. Central nervous system: Alert and oriented. No focal neurological deficits. Extremities: Symmetric 5 x 5 power.   Data Reviewed: Basic Metabolic Panel:  Recent Labs Lab 02/04/13 2216 02/06/13 0510  NA 144 144  K 4.0 3.8  CL 108 104  CO2 29 31  GLUCOSE 123* 119*  BUN 18 18  CREATININE 1.09 1.21  CALCIUM 8.8 8.8  Liver Function Tests:  Recent Labs Lab 02/04/13 2216  AST 22  ALT 25  ALKPHOS 97  BILITOT 0.7  PROT 6.1  ALBUMIN 3.2*   No results found for this basename: LIPASE, AMYLASE,  in the last 168 hours No results found for this basename: AMMONIA,  in the last 168 hours CBC:  Recent Labs Lab 02/04/13 2216 02/06/13 0510  WBC 4.1 3.5*  NEUTROABS 3.1  --   HGB 12.9* 14.0  HCT 41.0 45.6  MCV 82.0 83.5  PLT 91* PLATELET CLUMPS NOTED ON SMEAR, COUNT  APPEARS DECREASED   Cardiac Enzymes: No results found for this basename: CKTOTAL, CKMB, CKMBINDEX, TROPONINI,  in the last 168 hours BNP (last 3 results)  Recent Labs  02/04/13 2216  PROBNP 9137.0*   CBG: No results found for this basename: GLUCAP,  in the last 168 hours  No results found for this or any previous visit (from the past 240 hour(s)).    Additional labs: 1. 2-D echo 02/05/13: Study Conclusions  - Left ventricle: The cavity size was mildly dilated. Wall thickness was increased in a pattern of mild LVH. Systolic function was severely reduced. The estimated ejection fraction was in the range of 25% to 30%. Diffuse hypokinesis. - Mitral valve: Mild regurgitation. - Left atrium: The atrium was mildly dilated. - Right ventricle: The cavity size was mildly dilated. Systolic function was moderately reduced. - Right atrium: The atrium was mildly dilated. - Pulmonary arteries: Systolic pressure was severely increased. PA peak pressure: 64mm Hg (S). - Pericardium, extracardiac: A trivial pericardial effusion was identified.       Studies: Dg Chest 2 View  02/04/2013   CLINICAL DATA:  Shortness of breath, cough.  EXAM: CHEST  2 VIEW  COMPARISON:  None.  FINDINGS: Mild cardiomegaly is noted. Elevated right hemidiaphragm is noted with probable associated mild pleural effusion. Right lower lobe opacity is noted concerning for subsegmental atelectasis or pneumonia. No pneumothorax is noted. Bony thorax is intact.  IMPRESSION: Right basilar opacity consistent with pneumonia or subsegmental atelectasis with associated pleural effusion.   Electronically Signed   By: Roque Lias M.D.   On: 02/04/2013 22:00        Scheduled Meds: . aspirin  325 mg Oral Daily  . carvedilol  6.25 mg Oral BID  . ciprofloxacin  400 mg Intravenous Q12H  . furosemide  40 mg Intravenous Q12H  . heparin  5,000 Units Subcutaneous Q12H  . lisinopril  20 mg Oral Daily  . potassium chloride  10  mEq Oral Daily  . senna-docusate  1 tablet Oral BID   Continuous Infusions:   Active Problems:   HTN (hypertension)   Sleep apnea, obstructive   Obesity   History of noncompliance with medical treatment   Congestive dilated cardiomyopathy   Right ventricular dysfunction   Pulmonary hypertension   RBBB (right bundle branch block)   Acute systolic CHF (congestive heart failure)    Time spent: 25 minutes.    Marcellus Scott, MD, FACP, FHM. [facility] Pager [phone]  If 7PM-7AM, please contact night-coverage [email or web address] Password [facility] 02/06/2013, 12:43 PM    LOS: 2 days

## 2013-02-07 ENCOUNTER — Inpatient Hospital Stay (HOSPITAL_COMMUNITY): Payer: Medicaid Other

## 2013-02-07 DIAGNOSIS — R079 Chest pain, unspecified: Secondary | ICD-10-CM

## 2013-02-07 LAB — BASIC METABOLIC PANEL
CO2: 34 mEq/L — ABNORMAL HIGH (ref 19–32)
Chloride: 101 mEq/L (ref 96–112)
Creatinine, Ser: 1.35 mg/dL (ref 0.50–1.35)
GFR calc Af Amer: 63 mL/min — ABNORMAL LOW (ref >90)
Potassium: 3.7 mEq/L (ref 3.5–5.1)

## 2013-02-07 LAB — CBC
Hemoglobin: 13.1 g/dL (ref 13.0–17.0)
Platelets: 95 10*3/uL — ABNORMAL LOW (ref 150–400)
RBC: 5.08 MIL/uL (ref 4.22–5.81)
RDW: 15.6 % — ABNORMAL HIGH (ref 11.5–15.5)
WBC: 4.2 10*3/uL (ref 4.0–10.5)

## 2013-02-07 MED ORDER — CIPROFLOXACIN HCL 500 MG PO TABS
500.0000 mg | ORAL_TABLET | Freq: Two times a day (BID) | ORAL | Status: DC
  Administered 2013-02-07 – 2013-02-08 (×2): 500 mg via ORAL
  Filled 2013-02-07 (×4): qty 1

## 2013-02-07 MED ORDER — FUROSEMIDE 40 MG PO TABS
60.0000 mg | ORAL_TABLET | Freq: Every day | ORAL | Status: DC
  Administered 2013-02-07 – 2013-02-08 (×2): 60 mg via ORAL
  Filled 2013-02-07 (×2): qty 1

## 2013-02-07 MED ORDER — TECHNETIUM TC 99M SESTAMIBI GENERIC - CARDIOLITE
30.0000 | Freq: Once | INTRAVENOUS | Status: AC | PRN
  Administered 2013-02-07: 30 via INTRAVENOUS

## 2013-02-07 MED ORDER — REGADENOSON 0.4 MG/5ML IV SOLN
INTRAVENOUS | Status: AC
  Administered 2013-02-07: 0.4 mg
  Filled 2013-02-07: qty 5

## 2013-02-07 MED ORDER — TECHNETIUM TC 99M SESTAMIBI GENERIC - CARDIOLITE
10.0000 | Freq: Once | INTRAVENOUS | Status: AC | PRN
  Administered 2013-02-07: 08:00:00 10 via INTRAVENOUS

## 2013-02-07 NOTE — Progress Notes (Signed)
Patient ID: Jesse Torres, male   DOB: [birth year], 62 y.o.   MRN: [id number]    SUBJECTIVE: Currently the patient is off the floor having his nuclear stress study. I spoke to the patient's daughter in the room. I explained to her the importance of the compliance with medications and physician followup after hospitalization.  The patient is diuresing.   Filed Vitals:   02/06/13 1358 02/06/13 2011 02/07/13 0508 02/07/13 0511  BP: 146/67 129/71 130/82   Pulse: 92 65 50 69  Temp: 98.3 F (36.8 C) 97.9 F (36.6 C) 97.5 F (36.4 C)   TempSrc: Oral Oral Oral   Resp: 22 18 18    Height:      Weight:   230 lb (104.327 kg)   SpO2: 95% 97% 98%      Intake/Output Summary (Last 24 hours) at 02/07/13 0841 Last data filed at 02/07/13 0700  Gross per 24 hour  Intake    730 ml  Output   2926 ml  Net  -2196 ml    LABS: Basic Metabolic Panel:  Recent Labs  40/98/11 0510 02/07/13 0540  NA 144 144  K 3.8 3.7  CL 104 101  CO2 31 34*  GLUCOSE 119* 132*  BUN 18 21  CREATININE 1.21 1.35  CALCIUM 8.8 8.7   Liver Function Tests:  Recent Labs  02/04/13 2216  AST 22  ALT 25  ALKPHOS 97  BILITOT 0.7  PROT 6.1  ALBUMIN 3.2*   No results found for this basename: LIPASE, AMYLASE,  in the last 72 hours CBC:  Recent Labs  02/04/13 2216 02/06/13 0510 02/07/13 0540  WBC 4.1 3.5* 4.2  NEUTROABS 3.1  --   --   HGB 12.9* 14.0 13.1  HCT 41.0 45.6 41.3  MCV 82.0 83.5 81.3  PLT 91* PLATELET CLUMPS NOTED ON SMEAR, COUNT APPEARS DECREASED 95*   Cardiac Enzymes: No results found for this basename: CKTOTAL, CKMB, CKMBINDEX, TROPONINI,  in the last 72 hours BNP: No components found with this basename: POCBNP,  D-Dimer: No results found for this basename: DDIMER,  in the last 72 hours Hemoglobin A1C:  Recent Labs  02/05/13 1912  HGBA1C 6.0*   Fasting Lipid Panel: No results found for this basename: CHOL, HDL, LDLCALC, TRIG, CHOLHDL, LDLDIRECT,  in the last 72 hours Thyroid  Function Tests:  Recent Labs  02/05/13 1912  TSH 2.877    RADIOLOGY: Dg Chest 2 View  02/04/2013   CLINICAL DATA:  Shortness of breath, cough.  EXAM: CHEST  2 VIEW  COMPARISON:  None.  FINDINGS: Mild cardiomegaly is noted. Elevated right hemidiaphragm is noted with probable associated mild pleural effusion. Right lower lobe opacity is noted concerning for subsegmental atelectasis or pneumonia. No pneumothorax is noted. Bony thorax is intact.  IMPRESSION: Right basilar opacity consistent with pneumonia or subsegmental atelectasis with associated pleural effusion.   Electronically Signed   By: Roque Lias M.D.   On: 02/04/2013 22:00     TELEMETRY:  I have reviewed telemetry today February 07, 2013. There is sinus rhythm.   ASSESSMENT AND PLAN:    HTN (hypertension)     Blood pressure seems to be coming under good control with diuresis and the appropriate other medications    Sleep apnea, obstructive   Obesity    History of noncompliance with medical treatment   I discussed with the patient's daughter the importance of his taking his medications and following up.       Congestive dilated  cardiomyopathy      He is on an ACE inhibitor and a diuretic and a beta blocker. I've chosen not to titrate his medicines any further today. Stress nuclear scan is being done today. If he has high-risk ischemia, catheterization will be recommended. If not, the patient can be followed with medications for his cardiomyopathy.     Right ventricular dysfunction   Pulmonary hypertension     We will have to see if right ventricular function improves over time as we treat his left ventricle.     RBBB (right bundle branch block)    Acute systolic CHF (congestive heart failure)    clinically the patient is improving. I am not able to examine him this morning. He is off the floor. Our team will help later today with the final assessment of his nuclear study. I will as Dr. Patty Sermons to help with the  assessment of his nuclear study later today.   If the patient's nuclear study reveals no marked ischemia, and if it is felt that his volume status has stabilized completely, consideration could be given to discharging him being sure that all of his outpatient followup is arranged including access to medications .  Willa Rough 02/07/2013 8:41 AM

## 2013-02-07 NOTE — Progress Notes (Signed)
1030 Back fromm STress Test  Via wheelchair. Seen by Dr. Waymon Amato

## 2013-02-07 NOTE — Progress Notes (Signed)
[facility] PROGRESS NOTE    JANE BROUGHTON VHQ:[id number] DOB: 1950-03-05 DOA: 02/04/2013 PCP: No PCP Per Patient  HPI/Brief narrative 62 y.o. male with hx of severe HTN, noncompliance with medical care due to lack of insurance, obesity, loud snoring, prior tobacco use, presents to the ER with several weeks of shortness of breath, orthopnea, PND, and peripheral edema. Evalation in the ER included a CXR which showed ? Infiltrate vs atelectasis with pleural effusion, BNP of 9137, normal WBC and renal fx tests, with normal LFTs. He also has mild redness and tenderness of his lower extremities. His EKG showed no acute ST T changes, and he is in SR with bifascicular blocks. He was found to be quite hypertensive in the ER, with BP 180/110. IV Lasix, oxygen, and IV labetelol were given with improvement of his symptoms. Hospitalist was asked to admit him for new onset of CHF. He has no chest pain.  Assessment/Plan:  Acute systolic CHF/dilated cardiomyopathy/RBBB - Etiology: Possible hypertensive heart disease - Patient admitted to telemetry and started on IV Lasix, low dose beta blockers, ACE inhibitor and nitrates. - Clinically improved but still significantly volume overloaded. - 2-D echo shows EF 25% with hypokinesis. Cardiology consultation and followup appreciated. - Creatinine slowly creeping up. Changed IV Lasix to by mouth on 12/26. 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 LVEF 34% with moderate apical hypokinesis.  Uncontrolled hypertension - Titrate carvedilol and lisinopril and monitor. - Better.  Possible cellulitis of both feet - Continue empiric Cipro. - Improving. We'll change antibiotics to by mouth and complete one week treatment.  Rule out sleep apnea - Will need formal sleep study as outpatient.  Thrombocytopenia - Stable. Unclear etiology. Outpatient  followup and evaluation.   Code Status: Full Family Communication: Discussed with 2 daughters at bedside. Disposition Plan: Home when medically stable-possibly 12/27   Consultants:  Cardiology  Procedures:  None  Antibiotics:  Cipro 12/24 >   Subjective: Denies chest pain or dyspnea. Leg swellings continue to improve. Denies orthopnea.  Objective: Filed Vitals:   02/07/13 0917 02/07/13 0919 02/07/13 0920 02/07/13 1100  BP: 156/99 148/95 158/93 123/65  Pulse: 78 81 81 69  Temp:      TempSrc:      Resp:      Height:      Weight:      SpO2:    100%    Intake/Output Summary (Last 24 hours) at 02/07/13 1436 Last data filed at 02/07/13 1306  Gross per 24 hour  Intake    770 ml  Output   2676 ml  Net  -1906 ml   Filed Weights   02/05/13 0415 02/06/13 0710 02/07/13 0508  Weight: 109.7 kg (241 lb 13.5 oz) 105.643 kg (232 lb 14.4 oz) 104.327 kg (230 lb)     Exam:  General exam: Middle-aged male, obese sitting up in bed. Respiratory system: clear to auscultation. No increased work of breathing. Cardiovascular system: S1 & S2 heard, RRR. No JVD, murmurs, gallops, clicks. 1+ pitting bilateral leg edema. No further erythema. Gastrointestinal system: Abdomen is nondistended, soft and nontender. Normal bowel sounds heard. Central nervous system: Alert and oriented. No focal neurological deficits. Extremities: Symmetric 5 x 5 power.   Data Reviewed: Basic Metabolic Panel:  Recent Labs Lab 02/04/13 2216 02/06/13 0510 02/07/13 0540  NA 144 144 144  K 4.0 3.8 3.7  CL 108 104  101  CO2 29 31 34*  GLUCOSE 123* 119* 132*  BUN 18 18 21   CREATININE 1.09 1.21 1.35  CALCIUM 8.8 8.8 8.7   Liver Function Tests:  Recent Labs Lab 02/04/13 2216  AST 22  ALT 25  ALKPHOS 97  BILITOT 0.7  PROT 6.1  ALBUMIN 3.2*   No results found for this basename: LIPASE, AMYLASE,  in the last 168 hours No results found for this basename: AMMONIA,  in the last 168  hours CBC:  Recent Labs Lab 02/04/13 2216 02/06/13 0510 02/07/13 0540  WBC 4.1 3.5* 4.2  NEUTROABS 3.1  --   --   HGB 12.9* 14.0 13.1  HCT 41.0 45.6 41.3  MCV 82.0 83.5 81.3  PLT 91* PLATELET CLUMPS NOTED ON SMEAR, COUNT APPEARS DECREASED 95*   Cardiac Enzymes: No results found for this basename: CKTOTAL, CKMB, CKMBINDEX, TROPONINI,  in the last 168 hours BNP (last 3 results)  Recent Labs  02/04/13 2216  PROBNP 9137.0*   CBG:  Recent Labs Lab 02/06/13 1557  GLUCAP 153*    No results found for this or any previous visit (from the past 240 hour(s)).    Additional labs: 1. 2-D echo 02/05/13: Study Conclusions  - Left ventricle: The cavity size was mildly dilated. Wall thickness was increased in a pattern of mild LVH. Systolic function was severely reduced. The estimated ejection fraction was in the range of 25% to 30%. Diffuse hypokinesis. - Mitral valve: Mild regurgitation. - Left atrium: The atrium was mildly dilated. - Right ventricle: The cavity size was mildly dilated. Systolic function was moderately reduced. - Right atrium: The atrium was mildly dilated. - Pulmonary arteries: Systolic pressure was severely increased. PA peak pressure: 64mm Hg (S). - Pericardium, extracardiac: A trivial pericardial effusion was identified.       Studies: Nm Myocar Multi W/spect W/wall Motion / Ef  02/07/2013   EXAM: MYOCARDIAL IMAGING WITH SPECT (REST AND PHARMACOLOGIC-STRESS)  GATED LEFT VENTRICULAR WALL MOTION STUDY  LEFT VENTRICULAR EJECTION FRACTION  TECHNIQUE: Standard myocardial SPECT imaging was performed after resting intravenous injection of 10 mCi Tc-52m sestamibi. Subsequently, intravenous infusion of Lexiscan was performed under the supervision of the Cardiology staff. At peak effect of the drug, 30 mCi Tc-76m sestamibi was injected intravenously and standard myocardial SPECT imaging was performed. Quantitative gated imaging was also performed to evaluate  left ventricular wall motion, and estimate left ventricular ejection fraction.  COMPARISON:  None.  FINDINGS: The patient underwent lexiscan imaging under the direction ot the cardiology staff.  EKG at baseline and with pharmacologic stress shows NSR with RBBB. There are no new ischemic changes with stress.  The patient tolerated the procedure well.  No chest pain.  Quality of the images is satisfactory.  Perfusion images show small medium intensity defect at apex consistent with old scar. There is no significant reversibility.  The LV cavity appears enlarged. The EDV is 178 ml. The ESV is 118 ml. The LV EF is 34%  Wall motion analysis shows moderate apical hypokinesis.  IMPRESSION: 1. 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 There is no significant reversibility.  2. Systolic LV dysfunction with EF 34% with moderate apical hypokinesis.   Electronically Signed   By: Cassell Clement M.D.   On: 02/07/2013 11:31        Scheduled Meds: . aspirin  325 mg Oral Daily  . carvedilol  6.25 mg Oral BID  . ciprofloxacin  500 mg Oral BID  .  furosemide  60 mg Oral Daily  . heparin  5,000 Units Subcutaneous Q8H  . lisinopril  20 mg Oral Daily  . potassium chloride  10 mEq Oral Daily  . senna-docusate  1 tablet Oral BID   Continuous Infusions:   Active Problems:   HTN (hypertension)   Sleep apnea, obstructive   Obesity   History of noncompliance with medical treatment   Congestive dilated cardiomyopathy   Right ventricular dysfunction   Pulmonary hypertension   RBBB (right bundle branch block)   Acute systolic CHF (congestive heart failure)    Time spent: 25 minutes.    Marcellus Scott, MD, FACP, FHM. [facility] Pager [phone]  If 7PM-7AM, please contact night-coverage [email or web address] Password [email or web address] 02/07/2013, 2:36 PM    LOS: 3 days

## 2013-02-07 NOTE — Progress Notes (Signed)
0700 Pt out of room for stress Test . Report received from [place], [place]

## 2013-02-08 DIAGNOSIS — I10 Essential (primary) hypertension: Secondary | ICD-10-CM

## 2013-02-08 DIAGNOSIS — Z9119 Patient's noncompliance with other medical treatment and regimen: Secondary | ICD-10-CM

## 2013-02-08 DIAGNOSIS — I2789 Other specified pulmonary heart diseases: Secondary | ICD-10-CM

## 2013-02-08 DIAGNOSIS — I4510 Unspecified right bundle-branch block: Secondary | ICD-10-CM

## 2013-02-08 DIAGNOSIS — E669 Obesity, unspecified: Secondary | ICD-10-CM

## 2013-02-08 DIAGNOSIS — G4733 Obstructive sleep apnea (adult) (pediatric): Secondary | ICD-10-CM

## 2013-02-08 DIAGNOSIS — I428 Other cardiomyopathies: Secondary | ICD-10-CM

## 2013-02-08 DIAGNOSIS — I519 Heart disease, unspecified: Secondary | ICD-10-CM

## 2013-02-08 LAB — BASIC METABOLIC PANEL
CO2: 26 mEq/L (ref 19–32)
Calcium: 8.5 mg/dL (ref 8.4–10.5)
Chloride: 99 mEq/L (ref 96–112)
Creatinine, Ser: 1.23 mg/dL (ref 0.50–1.35)
GFR calc Af Amer: 71 mL/min — ABNORMAL LOW (ref >90)
Glucose, Bld: 102 mg/dL — ABNORMAL HIGH (ref 70–99)
Potassium: 4.4 mEq/L (ref 3.5–5.1)
Sodium: 139 mEq/L (ref 135–145)

## 2013-02-08 LAB — PRO B NATRIURETIC PEPTIDE: Pro B Natriuretic peptide (BNP): 2740 pg/mL — ABNORMAL HIGH (ref 0–125)

## 2013-02-08 MED ORDER — LISINOPRIL 20 MG PO TABS: 20.0000 mg | ORAL_TABLET | Freq: Every day | ORAL | Status: DC

## 2013-02-08 MED ORDER — FUROSEMIDE 20 MG PO TABS: 60.0000 mg | ORAL_TABLET | Freq: Every day | ORAL | Status: DC

## 2013-02-08 MED ORDER — CARVEDILOL 6.25 MG PO TABS: 6.2500 mg | ORAL_TABLET | Freq: Two times a day (BID) | ORAL | Status: DC

## 2013-02-08 MED ORDER — POTASSIUM CHLORIDE ER 10 MEQ PO TBCR: 10.0000 meq | EXTENDED_RELEASE_TABLET | Freq: Every day | ORAL | Status: DC

## 2013-02-08 MED ORDER — CIPROFLOXACIN HCL 500 MG PO TABS: 500.0000 mg | ORAL_TABLET | Freq: Two times a day (BID) | ORAL | Status: DC

## 2013-02-08 MED ORDER — ASPIRIN EC 81 MG PO TBEC: 81.0000 mg | DELAYED_RELEASE_TABLET | Freq: Every day | ORAL | Status: DC

## 2013-02-08 NOTE — Discharge Summary (Signed)
Physician Discharge Summary  LAURENT CARGILE ZOX:[id number] DOB: September 06, 1950 DOA: 02/04/2013  PCP: No PCP Per Patient  Admit date: 02/04/2013 Discharge date: 02/08/2013  Time spent: Less than 30 minutes  Recommendations for Outpatient Follow-up:  1. [facility] on 03/11/13 at 10:30 AM 2. Dr. Willa Rough, cardiology in 2 weeks with repeat labs (BMP & CBC). 3. Consider out patient Sleep Study to evaluate for sleep apnea.    Discharge Diagnoses:  Active Problems:   HTN (hypertension)   Sleep apnea, obstructive   Obesity   History of noncompliance with medical treatment   Congestive dilated cardiomyopathy   Right ventricular dysfunction   Pulmonary hypertension   RBBB (right bundle branch block)   Acute systolic CHF (congestive heart failure)   Discharge Condition: Improved & Stable  Diet recommendation: Heart healthy diet.  Filed Weights   02/06/13 0710 02/07/13 0508 02/08/13 0436  Weight: 105.643 kg (232 lb 14.4 oz) 104.327 kg (230 lb) 104.3 kg (229 lb 15 oz)    History of present illness:  62 y.o. male with hx of severe HTN, noncompliance with medical care due to lack of insurance, obesity, loud snoring, prior tobacco use, presents to the ER with several weeks of shortness of breath, orthopnea, PND, and peripheral edema. Evalation in the ER included a CXR which showed ? Infiltrate vs atelectasis with pleural effusion, BNP of 9137, normal WBC and renal fx tests, with normal LFTs. He also has mild redness and tenderness of his lower extremities. His EKG showed no acute ST T changes, and he is in SR with bifascicular blocks. He was found to be quite hypertensive in the ER, with BP 180/110. IV Lasix, oxygen, and IV labetelol were given with improvement of his symptoms. Hospitalist was asked to admit him for new onset of CHF. He has no chest pain.   Hospital Course:   Acute systolic CHF/dilated cardiomyopathy/RBBB  - Etiology: Possibly from  hypertensive heart disease related to noncompliance - Patient admitted to telemetry and started on IV Lasix, low dose beta blockers, ACE inhibitor and nitrates.  - Clinically improved but still significantly volume overloaded.  - 2-D echo shows EF 25% with hypokinesis. Cardiology consultation and followup appreciated.  - Creatinine had slightly increased whereby changed IV Lasix to by mouth on 12/26.  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 LVEF 34% with moderate apical hypokinesis. - Creatinine has improved. Cardiology has evaluated patient today and cleared him for discharge home. Most of his medications can be filled on dollar 4 prescription plans. He has been counseled extensively regarding compliance with medications, diet restrictions and M.D. followups. He verbalizes understanding. Family counseled at bedside too. - Will need addition of Aldactone as outpatient.   Uncontrolled hypertension  - Started carvedilol and lisinopril with improved control - These medications can be titrated as outpatient.  Possible cellulitis of both feet  - Continue empiric Cipro.  - Resolved. Complete total one week of antibiotics. Patient advised not to scratch and used moisturizing lotion on skin for dry skin.  Rule out sleep apnea  - Will need formal sleep study as outpatient.   Thrombocytopenia  - Stable. Unclear etiology. Outpatient followup and evaluation.   Consultations:  Cardiology  Procedures:  Nuclear stress test    Discharge Exam:  Complaints:  Patient denies dyspnea. Leg swellings continue to improve. No leg pain or redness.  Filed Vitals:   02/07/13 1445 02/07/13 2003 02/08/13 0436 02/08/13 0948  BP: 120/49 134/84 140/90 126/52  Pulse: 64 68 75 67  Temp: 97.5 F (36.4 C) 97.3 F (36.3 C) 97.8 F (36.6 C)   TempSrc: Oral Oral Oral   Resp: 18 20 20 20    Height:      Weight:   104.3 kg (229 lb 15 oz)   SpO2: 98% 100% 99% 100%    General exam: Middle-aged male, obese sitting up in bed.  Respiratory system: clear to auscultation. No increased work of breathing.  Cardiovascular system: S1 & S2 heard, RRR. No JVD, murmurs, gallops, clicks. 1+ pitting bilateral leg edema. No further erythema. 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 Gastrointestinal system: Abdomen is nondistended, soft and nontender. Normal bowel sounds heard.  Central nervous system: Alert and oriented. No focal neurological deficits.  Extremities: Symmetric 5 x 5 power.   Discharge Instructions      Discharge Orders   Future Appointments Provider Department Dept Phone   03/11/2013 10:30 AM Jeanann Lewandowsky, MD [facility] [phone]   Future Orders Complete By Expires   (HEART FAILURE PATIENTS) Call MD:  Anytime you have any of the following symptoms: 1) 3 pound weight gain in 24 hours or 5 pounds in 1 week 2) shortness of breath, with or without a dry hacking cough 3) swelling in the hands, feet or stomach 4) if you have to sleep on extra pillows at night in order to breathe.  As directed    Call MD for:  difficulty breathing, headache or visual disturbances  As directed    Call MD for:  extreme fatigue  As directed    Call MD for:  persistant dizziness or light-headedness  As directed    Diet - low sodium heart healthy  As directed    Increase activity slowly  As directed        Medication List         aspirin EC 81 MG tablet  Take 1 tablet (81 mg total) by mouth daily.     carvedilol 6.25 MG tablet  Commonly known as:  COREG  Take 1 tablet (6.25 mg total) by mouth 2 (two) times daily.     ciprofloxacin 500 MG tablet  Commonly known as:  CIPRO  Take 1 tablet (500 mg total) by mouth 2 (two) times daily.     furosemide 20 MG tablet  Commonly known as:  LASIX  Take 3 tablets (60 mg total) by mouth daily.     lisinopril 20 MG tablet   Commonly known as:  PRINIVIL,ZESTRIL  Take 1 tablet (20 mg total) by mouth daily.     naproxen sodium 220 MG tablet  Commonly known as:  ANAPROX  Take 440 mg by mouth daily as needed (pain).     potassium chloride 10 MEQ tablet  Commonly known as:  K-DUR  Take 1 tablet (10 mEq total) by mouth daily.       Follow-up Information   Follow up with [facility]     On 03/11/2013. (Tuesday @10 :30 AM)    Contact information:   [address] [place] [address] [phone]      Follow up with Willa Rough, MD. Schedule an appointment as soon as possible for a visit in 2 weeks. (To be seen with repeat labs (BMP & CBC))    Specialty:  Cardiology   Contact information:   [address]  [address] [place] [address] [phone]       The results of significant diagnostics from this hospitalization (including imaging, microbiology, ancillary and laboratory) are listed below for reference.    Significant Diagnostic Studies: Dg Chest 2 View  02/04/2013   CLINICAL DATA:  Shortness of breath, cough.  EXAM: CHEST  2 VIEW  COMPARISON:  None.  FINDINGS: Mild cardiomegaly is noted. Elevated right hemidiaphragm is noted with probable associated mild pleural effusion. Right lower lobe opacity is noted concerning for subsegmental atelectasis or pneumonia. No pneumothorax is noted. Bony thorax is intact.  IMPRESSION: Right basilar opacity consistent with pneumonia or subsegmental atelectasis with associated pleural effusion.   Electronically Signed   By: Roque Lias M.D.   On: 02/04/2013 22:00   Nm Myocar Multi W/spect W/wall Motion / Ef  02/07/2013   EXAM: MYOCARDIAL IMAGING WITH SPECT (REST AND PHARMACOLOGIC-STRESS)  GATED LEFT VENTRICULAR WALL MOTION STUDY  LEFT VENTRICULAR EJECTION FRACTION  TECHNIQUE: Standard myocardial SPECT imaging was performed after resting intravenous injection of 10 mCi Tc-15m sestamibi. Subsequently, intravenous infusion  of Lexiscan was performed under the supervision of the Cardiology staff. At peak effect of the drug, 30 mCi Tc-29m sestamibi was injected intravenously and standard myocardial SPECT imaging was performed. Quantitative gated imaging was also performed to evaluate left ventricular wall motion, and estimate left ventricular ejection fraction.  COMPARISON:  None.  FINDINGS: The patient underwent lexiscan imaging under the direction ot the cardiology staff.  EKG at baseline and with pharmacologic stress shows NSR with RBBB. There are no new ischemic changes with stress.  The patient tolerated the procedure well.  No chest pain.  Quality of the images is satisfactory.  Perfusion images show small medium intensity defect at apex consistent with old scar. There is no significant reversibility.  The LV cavity appears enlarged. The EDV is 178 ml. The ESV is 118 ml. The LV EF is 34%  Wall motion analysis shows moderate apical hypokinesis.  IMPRESSION: 1. 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 There is no significant reversibility.  2. Systolic LV dysfunction with EF 34% with moderate apical hypokinesis.   Electronically Signed   By: Cassell Clement M.D.   On: 02/07/2013 11:31    Microbiology: No results found for this or any previous visit (from the past 240 hour(s)).   Labs: Basic Metabolic Panel:  Recent Labs Lab 02/04/13 2216 02/06/13 0510 02/07/13 0540 02/08/13 0536  NA 144 144 144 139  K 4.0 3.8 3.7 4.4  CL 108 104 101 99  CO2 29 31 34* 26  GLUCOSE 123* 119* 132* 102*  BUN 18 18 21 22   CREATININE 1.09 1.21 1.35 1.23  CALCIUM 8.8 8.8 8.7 8.5   Liver Function Tests:  Recent Labs Lab 02/04/13 2216  AST 22  ALT 25  ALKPHOS 97  BILITOT 0.7  PROT 6.1  ALBUMIN 3.2*   No results found for this basename: LIPASE, AMYLASE,  in the last 168 hours No results found for this basename: AMMONIA,  in the last 168 hours CBC:  Recent Labs Lab 02/04/13 2216  02/06/13 0510 02/07/13 0540  WBC 4.1 3.5* 4.2  NEUTROABS 3.1  --   --   HGB 12.9* 14.0 13.1  HCT 41.0 45.6 41.3  MCV 82.0 83.5 81.3  PLT 91* PLATELET CLUMPS NOTED ON SMEAR, COUNT APPEARS DECREASED 95*   Cardiac Enzymes: No results found for this basename: CKTOTAL, CKMB, CKMBINDEX, TROPONINI,  in the last 168 hours BNP:  BNP (last 3 results)  Recent Labs  02/04/13 2216 02/08/13 0526  PROBNP 9137.0* 2740.0*   CBG:  Recent Labs Lab 02/06/13 1557  GLUCAP 153*    Additional labs: 1. Hemoglobin A1c: 6 2. TSH: 2.877 3. 2-D echo 02/05/13: Study Conclusions  - Left ventricle: The cavity size was mildly dilated. Wall thickness was increased in a pattern of mild LVH. Systolic function was severely reduced. The estimated ejection fraction was in the range of 25% to 30%. Diffuse hypokinesis. - Mitral valve: Mild regurgitation. - Left atrium: The atrium was mildly dilated. - Right ventricle: The cavity size was mildly dilated. Systolic function was moderately reduced. - Right atrium: The atrium was mildly dilated. - Pulmonary arteries: Systolic pressure was severely increased. PA peak pressure: 64mm Hg (S). - Pericardium, extracardiac: A trivial pericardial effusion was identified.     Signed:  Marcellus Scott, MD, FACP, FHM. [facility] Pager [phone]  If 7PM-7AM, please contact night-coverage [email or web address] Password [facility] 02/08/2013, 11:39 AM

## 2013-02-08 NOTE — Progress Notes (Signed)
1140 Discharge instructions and prescriptions given to pt and family Verbalized understanding

## 2013-02-08 NOTE — Progress Notes (Signed)
SUBJECTIVE: Feels well, denying chest pain, shortness of breath, lightheadedness, and palpitations. Says "leg swelling is much much better".     Intake/Output Summary (Last 24 hours) at 02/08/13 1610 Last data filed at 02/08/13 9604  Gross per 24 hour  Intake   1530 ml  Output    925 ml  Net    605 ml    Current Facility-Administered Medications  Medication Dose Route Frequency Provider Last Rate Last Dose  . aspirin tablet 325 mg  325 mg Oral Daily Imagene Sheller, MD   325 mg at 02/07/13 1216  . carvedilol (COREG) tablet 6.25 mg  6.25 mg Oral BID Elease Etienne, MD   6.25 mg at 02/07/13 2119  . ciprofloxacin (CIPRO) tablet 500 mg  500 mg Oral BID Elease Etienne, MD   500 mg at 02/07/13 2119  . furosemide (LASIX) tablet 60 mg  60 mg Oral Daily Elease Etienne, MD   60 mg at 02/07/13 1218  . heparin injection 5,000 Units  5,000 Units Subcutaneous Q8H Elease Etienne, MD   5,000 Units at 02/08/13 0536  . hydrALAZINE (APRESOLINE) injection 10 mg  10 mg Intravenous Q6H PRN Elease Etienne, MD      . lisinopril (PRINIVIL,ZESTRIL) tablet 20 mg  20 mg Oral Daily Elease Etienne, MD   20 mg at 02/07/13 1217  . ondansetron (ZOFRAN) tablet 4 mg  4 mg Oral Q4H PRN Houston Siren, MD       Or  . ondansetron [facility]) injection 4 mg  4 mg Intravenous Q4H PRN Houston Siren, MD      . potassium chloride (K-DUR) CR tablet 10 mEq  10 mEq Oral Daily Houston Siren, MD   10 mEq at 02/07/13 1216  . senna-docusate (Senokot-S) tablet 1 tablet  1 tablet Oral BID Houston Siren, MD   1 tablet at 02/07/13 1217    Filed Vitals:   02/07/13 1100 02/07/13 1445 02/07/13 2003 02/08/13 0436  BP: 123/65 120/49 134/84 140/90  Pulse: 69 64 68 75  Temp:  97.5 F (36.4 C) 97.3 F (36.3 C) 97.8 F (36.6 C)  TempSrc:  Oral Oral Oral  Resp:  18 20 20   Height:      Weight:    229 lb 15 oz (104.3 kg)  SpO2: 100% 98% 100% 99%    PHYSICAL EXAM General: NAD Neck: No JVD, no thyromegaly or thyroid nodule.  Lungs: Clear  to auscultation bilaterally with normal respiratory effort. CV: Nondisplaced PMI.  Regular rhythm, normal S1/S2, no S3/S4, no murmur.  Trace pretibial and pedal edema.  No carotid bruit.  Normal pedal pulses.  Abdomen: Soft, nontender, no hepatosplenomegaly, no distention.  Neurologic: Alert and oriented x 3.  Psych: Normal affect. Extremities: No clubbing or cyanosis.   TELEMETRY: Reviewed telemetry pt in normal sinus rhythm with PVC's and PAC's.  LABS: Basic Metabolic Panel:  Recent Labs  54/09/81 0510 02/07/13 0540  NA 144 144  K 3.8 3.7  CL 104 101  CO2 31 34*  GLUCOSE 119* 132*  BUN 18 21  CREATININE 1.21 1.35  CALCIUM 8.8 8.7   Liver Function Tests: No results found for this basename: AST, ALT, ALKPHOS, BILITOT, PROT, ALBUMIN,  in the last 72 hours No results found for this basename: LIPASE, AMYLASE,  in the last 72 hours CBC:  Recent Labs  02/06/13 0510 02/07/13 0540  WBC 3.5* 4.2  HGB 14.0 13.1  HCT 45.6 41.3  MCV  83.5 81.3  PLT PLATELET CLUMPS NOTED ON SMEAR, COUNT APPEARS DECREASED 95*   Cardiac Enzymes: No results found for this basename: CKTOTAL, CKMB, CKMBINDEX, TROPONINI,  in the last 72 hours BNP: No components found with this basename: POCBNP,  D-Dimer: No results found for this basename: DDIMER,  in the last 72 hours Hemoglobin A1C:  Recent Labs  02/05/13 1912  HGBA1C 6.0*   Fasting Lipid Panel: No results found for this basename: CHOL, HDL, LDLCALC, TRIG, CHOLHDL, LDLDIRECT,  in the last 72 hours Thyroid Function Tests:  Recent Labs  02/05/13 1912  TSH 2.877   Anemia Panel: No results found for this basename: VITAMINB12, FOLATE, FERRITIN, TIBC, IRON, RETICCTPCT,  in the last 72 hours  RADIOLOGY: Dg Chest 2 View  02/04/2013   CLINICAL DATA:  Shortness of breath, cough.  EXAM: CHEST  2 VIEW  COMPARISON:  None.  FINDINGS: Mild cardiomegaly is noted. Elevated right hemidiaphragm is noted with probable associated mild pleural effusion.  Right lower lobe opacity is noted concerning for subsegmental atelectasis or pneumonia. No pneumothorax is noted. Bony thorax is intact.  IMPRESSION: Right basilar opacity consistent with pneumonia or subsegmental atelectasis with associated pleural effusion.   Electronically Signed   By: Roque Lias M.D.   On: 02/04/2013 22:00   Nm Myocar Multi W/spect W/wall Motion / Ef  02/07/2013   EXAM: MYOCARDIAL IMAGING WITH SPECT (REST AND PHARMACOLOGIC-STRESS)  GATED LEFT VENTRICULAR WALL MOTION STUDY  LEFT VENTRICULAR EJECTION FRACTION  TECHNIQUE: Standard myocardial SPECT imaging was performed after resting intravenous injection of 10 mCi Tc-67m sestamibi. Subsequently, intravenous infusion of Lexiscan was performed under the supervision of the Cardiology staff. At peak effect of the drug, 30 mCi Tc-37m sestamibi was injected intravenously and standard myocardial SPECT imaging was performed. Quantitative gated imaging was also performed to evaluate left ventricular wall motion, and estimate left ventricular ejection fraction.  COMPARISON:  None.  FINDINGS: The patient underwent lexiscan imaging under the direction ot the cardiology staff.  EKG at baseline and with pharmacologic stress shows NSR with RBBB. There are no new ischemic changes with stress.  The patient tolerated the procedure well.  No chest pain.  Quality of the images is satisfactory.  Perfusion images show small medium intensity defect at apex consistent with old scar. There is no significant reversibility.  The LV cavity appears enlarged. The EDV is 178 ml. The ESV is 118 ml. The LV EF is 34%  Wall motion analysis shows moderate apical hypokinesis.  IMPRESSION: 1. 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 There is no significant reversibility.  2. Systolic LV dysfunction with EF 34% with moderate apical hypokinesis.   Electronically Signed   By: Cassell Clement M.D.   On: 02/07/2013 11:31    Echo: EF 25-30%,  severe diffuse hypokinesis.  ASSESSMENT AND PLAN: 1. 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 BNP down to 2740. Further medication adjustments will be done as an outpatient. Will also require outpatient sleep study for formal sleep apnea evaluation. Reduce ASA to 81 mg daily. Spironolactone in the future (as an outpatient) once medication-compliance is assured. 2. HTN: reasonably controlled on present therapy. 3. Pulmonary HTN: continue with present therapies. Outpatient sleep study for formal sleep apnea evaluation.  Dispo: pt can be discharged from my standpoint with close outpatient f/u and f/u with [facility].  Kate Sable, M.D., F.A.C.C.

## 2013-02-10 NOTE — ED Provider Notes (Signed)
I saw and evaluated the patient, reviewed the resident's note and I agree with the findings and plan.  EKG Interpretation    Date/Time:  Tuesday February 04 2013 21:22:51 EST Ventricular Rate:  96 PR Interval:  158 QRS Duration: 134 QT Interval:  416 QTC Calculation: 525 R Axis:   -86 Text Interpretation:  Sinus rhythm with frequent and consecutive Premature ventricular complexes Possible Left atrial enlargement Right bundle branch block Left anterior fascicular block  Bifascicular block  Inferior infarct , age undetermined Anterior infarct , age undetermined Abnormal ECG Reconfirmed by Rubin Payor  MD, Trentin Knappenberger [id number]) on 02/10/2013 11:44:44 PM           Patient with new onset CHF. Does not appear to be in severe distress. We'll be admitted to internal medicine.  Juliet Rude. Rubin Payor, MD 02/10/13 [phone]

## 2013-02-25 ENCOUNTER — Ambulatory Visit (INDEPENDENT_AMBULATORY_CARE_PROVIDER_SITE_OTHER): Payer: Self-pay | Admitting: Physician Assistant

## 2013-02-25 ENCOUNTER — Encounter: Payer: Self-pay | Admitting: Physician Assistant

## 2013-02-25 VITALS — BP 140/80 | HR 55 | Ht 67.0 in | Wt 216.0 lb

## 2013-02-25 DIAGNOSIS — I5022 Chronic systolic (congestive) heart failure: Secondary | ICD-10-CM

## 2013-02-25 DIAGNOSIS — I10 Essential (primary) hypertension: Secondary | ICD-10-CM

## 2013-02-25 DIAGNOSIS — I428 Other cardiomyopathies: Secondary | ICD-10-CM

## 2013-02-25 DIAGNOSIS — I429 Cardiomyopathy, unspecified: Secondary | ICD-10-CM

## 2013-02-25 DIAGNOSIS — I272 Pulmonary hypertension, unspecified: Secondary | ICD-10-CM

## 2013-02-25 DIAGNOSIS — R0683 Snoring: Secondary | ICD-10-CM | POA: Insufficient documentation

## 2013-02-25 DIAGNOSIS — I5030 Unspecified diastolic (congestive) heart failure: Secondary | ICD-10-CM | POA: Insufficient documentation

## 2013-02-25 DIAGNOSIS — I2789 Other specified pulmonary heart diseases: Secondary | ICD-10-CM

## 2013-02-25 DIAGNOSIS — D696 Thrombocytopenia, unspecified: Secondary | ICD-10-CM

## 2013-02-25 DIAGNOSIS — Z0389 Encounter for observation for other suspected diseases and conditions ruled out: Secondary | ICD-10-CM

## 2013-02-25 DIAGNOSIS — I509 Heart failure, unspecified: Secondary | ICD-10-CM

## 2013-02-25 LAB — CBC WITH DIFFERENTIAL/PLATELET
BASOS ABS: 0 10*3/uL (ref 0.0–0.1)
BASOS PCT: 0.1 % (ref 0.0–3.0)
Eosinophils Absolute: 0 10*3/uL (ref 0.0–0.7)
Eosinophils Relative: 0 % (ref 0.0–5.0)
HCT: 40.9 % (ref 39.0–52.0)
Hemoglobin: 13.3 g/dL (ref 13.0–17.0)
Lymphocytes Relative: 15.6 % (ref 12.0–46.0)
Lymphs Abs: 0.7 10*3/uL (ref 0.7–4.0)
MCHC: 32.6 g/dL (ref 30.0–36.0)
MCV: 78.4 fl (ref 78.0–100.0)
MONO ABS: 0.4 10*3/uL (ref 0.1–1.0)
Monocytes Relative: 8.4 % (ref 3.0–12.0)
Neutro Abs: 3.5 10*3/uL (ref 1.4–7.7)
Neutrophils Relative %: 75.9 % (ref 43.0–77.0)
Platelets: 57 10*3/uL — ABNORMAL LOW (ref 150.0–400.0)
RBC: 5.22 Mil/uL (ref 4.22–5.81)
RDW: 16.7 % — AB (ref 11.5–14.6)
WBC: 4.6 10*3/uL (ref 4.5–10.5)

## 2013-02-25 LAB — BASIC METABOLIC PANEL
BUN: 22 mg/dL (ref 6–23)
CALCIUM: 9.4 mg/dL (ref 8.4–10.5)
CHLORIDE: 103 meq/L (ref 96–112)
CO2: 31 meq/L (ref 19–32)
Creatinine, Ser: 1.1 mg/dL (ref 0.4–1.5)
GFR: 69.1 mL/min (ref >60.00)
GLUCOSE: 89 mg/dL (ref 70–99)
Potassium: 4.1 mEq/L (ref 3.5–5.1)
Sodium: 141 mEq/L (ref 135–145)

## 2013-02-25 MED ORDER — LISINOPRIL 20 MG PO TABS: ORAL_TABLET | ORAL | Status: DC

## 2013-02-25 NOTE — Patient Instructions (Addendum)
INCREASE LISINOPRIL TO 20 MG IN THE MORNING AND 10 MG IN THE EVENING; RX SENT INTO [facility] [address]  LAB WORK TODAY; BMET, CBC W/DIFF  LAB WORK TO BE DONE ON 03/11/13 WITH YOUR PRIMARY CARE PHYSICIAN ; FASTING LIPID AND LIVER PANEL AND BMET  PLEASE FOLLOW UP WITH SCOTT WEAVER, PAC IN 3-4 WEEKS  PLEASE FOLLOW UP WITH DR. KATZ IN 8-10 WEEKS

## 2013-02-25 NOTE — Progress Notes (Signed)
[address] [place], [place]  [id number] Phone: [phone]) [phone] Fax:  [phone]  Date:  02/25/2013   ID:  Jesse Torres, DOB 09/24/1950, MRN [id number]  PCP:  No PCP Per Patient  Cardiologist:  Dr. Zackery BarefootJeffery Katz     History of Present Illness: Jesse Torres is a 63 y.o. male with a hx of HTN & obesity. He has had poor follow up for HTN due to lack of insurance. He was recently admitted 12/23-12/27 with new onset systolic CHF.  He presented to the emergency room with symptoms of volume excess in the setting of markedly elevated blood pressure (180/110). Echocardiogram (02/05/2013): Mild LVH, EF 25-30%, diffuse HK, mild MR, mild LAE, mild RVE, moderately reduced RVSF, mild RAE, PASP 64, trivial effusion.  Patient was seen by Dr. Myrtis SerKatz. CHF medications were initiated. He was diuresed and lost 9 pounds. He was set up for an inpatient nuclear stress test.  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  Overall, cardiomyopathy was felt to likely be from untreated HTN. With significant pulmonary hypertension and history of snoring, outpatient sleep study was also recommended. Of note, the patient was treated with antibiotics for possible lower extremity cellulitis while hospitalized.  He is doing well since d/c.  The patient denies chest pain, shortness of breath, syncope, orthopnea, PND or significant pedal edema. Weight is down since d/c.  He has an occasional cough that is non-productive.    Recent Labs: 02/04/2013: ALT 25  02/05/2013: TSH 2.877  02/07/2013: Hemoglobin 13.1  02/08/2013: Creatinine 1.23; Potassium 4.4; Pro B Natriuretic peptide (BNP) 2740.0*   Wt Readings from Last 3 Encounters:  02/25/13 216 lb (97.977 kg)  02/08/13 229 lb 15 oz (104.3 kg)     Past Medical History  Diagnosis Date  . Hypertension   . Cardiomyopathy     probable HTN cardiomyopathy;  Lexiscan Myoview (02/07/2013): EF 34%, old small  apical infarct, no ischemia, moderate apical hypokinesis.  . Snoring     needs sleep study  . Pulmonary HTN   . Chronic systolic CHF (congestive heart failure)     a. Echocardiogram (02/05/2013): Mild LVH, EF 25-30%, diffuse HK, mild MR, mild LAE, mild RVE, moderately reduced RVSF, mild RAE, PASP 64, trivial effusion.  . Thrombocytopenia     Current Outpatient Prescriptions  Medication Sig Dispense Refill  . aspirin EC 81 MG tablet Take 1 tablet (81 mg total) by mouth daily.      . carvedilol (COREG) 6.25 MG tablet Take 1 tablet (6.25 mg total) by mouth 2 (two) times daily.  60 tablet  1  . furosemide (LASIX) 20 MG tablet Take 3 tablets (60 mg total) by mouth daily.  90 tablet  1  . lisinopril (PRINIVIL,ZESTRIL) 20 MG tablet Take 1 tablet (20 mg total) by mouth daily.  30 tablet  1  . naproxen sodium (ANAPROX) 220 MG tablet Take 440 mg by mouth daily as needed (pain).      . potassium chloride (K-DUR) 10 MEQ tablet Take 1 tablet (10 mEq total) by mouth daily.  30 tablet  1   No current facility-administered medications for this visit.    Allergies:   Review of patient's allergies indicates no known allergies.   Social History:  The patient  reports that he quit smoking about 18 years ago. His smoking use included Cigarettes. He smoked 0.00 packs per day. He does not have any smokeless tobacco history on  file. He reports that he does not drink alcohol or use illicit drugs.   Family History:  The patient's family history is not on file.   ROS:  Please see the history of present illness.    All other systems reviewed and negative.   PHYSICAL EXAM: VS:  BP 140/80  Pulse 55  Ht 5\' 7"  (1.702 m)  Wt 216 lb (97.977 kg)  BMI 33.82 kg/m2 Well nourished, well developed, in no acute distress HEENT: normal Neck: no JVD Cardiac:  normal S1, S2; RRR; no murmur Lungs:  clear to auscultation bilaterally, no wheezing, rhonchi or rales Abd: soft, nontender, no hepatomegaly Ext: no edema Skin:  warm and dry Neuro:  CNs 2-12 intact, no focal abnormalities noted  EKG:  NSR, HR 55, RBBB, PVCs     ASSESSMENT AND PLAN:  1. Chronic Systolic CHF:  Volume stable.  Continue current Rx.  Check follow up BMET today. 2. 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  Continue beta blocker.  Increase Lisinopril to 20 in AM and 10 in PM.  Consider spironolactone if he maintains compliance.  Check BMET today.  Check repeat BMET in 2 weeks.  Consider follow up Echo in 3 mos.  If EF remains < 35%, he will need referral to EP for possible ICD. 3. CAD:  Suspect he has underlying CAD given apical scar on myoview.  Continue ASA.  Check Lipids.  If above goal, he will need statin Rx.   4. Hypertension:  Fair control.  Adjust ACEI for cardiomyopathy.   5. Pulmonary HTN:  With hx of snoring and obesity, he will need sleep study arranged.  He is currently Medicaid pending.  Will wait until he has full insurance coverage before arranging.   6. Thrombocytopenia:  Repeat CBC today.  Follow up with primary care.   7. Disposition:  F/u with me in 3-4 weeks and Dr. Zackery Barefoot in 8-10 weeks.   Signed, Tereso Newcomer, PA-C  02/25/2013 10:08 AM

## 2013-02-26 ENCOUNTER — Telehealth: Payer: Self-pay | Admitting: *Deleted

## 2013-02-26 ENCOUNTER — Encounter: Payer: Self-pay | Admitting: *Deleted

## 2013-02-26 NOTE — Telephone Encounter (Signed)
pt notified about lab results and to f/u with PCP about results, pt advised per Tereso Newcomer, PA pt may need to be referred to hematology. Pt verbalized understanding to Plan of Care.

## 2013-03-11 ENCOUNTER — Encounter: Payer: Self-pay | Admitting: Internal Medicine

## 2013-03-11 ENCOUNTER — Ambulatory Visit: Payer: Medicaid Other | Attending: Internal Medicine | Admitting: Internal Medicine

## 2013-03-11 VITALS — BP 138/74 | HR 45 | Temp 97.5°F | Resp 14 | Ht 67.0 in | Wt 211.0 lb

## 2013-03-11 DIAGNOSIS — I10 Essential (primary) hypertension: Secondary | ICD-10-CM

## 2013-03-11 DIAGNOSIS — I5022 Chronic systolic (congestive) heart failure: Secondary | ICD-10-CM

## 2013-03-11 DIAGNOSIS — I429 Cardiomyopathy, unspecified: Secondary | ICD-10-CM

## 2013-03-11 DIAGNOSIS — I428 Other cardiomyopathies: Secondary | ICD-10-CM | POA: Insufficient documentation

## 2013-03-11 DIAGNOSIS — I509 Heart failure, unspecified: Secondary | ICD-10-CM

## 2013-03-11 DIAGNOSIS — E785 Hyperlipidemia, unspecified: Secondary | ICD-10-CM

## 2013-03-11 LAB — BASIC METABOLIC PANEL
BUN: 23 mg/dL (ref 6–23)
CO2: 33 mEq/L — ABNORMAL HIGH (ref 19–32)
CREATININE: 1.23 mg/dL (ref 0.50–1.35)
Calcium: 10 mg/dL (ref 8.4–10.5)
Chloride: 99 mEq/L (ref 96–112)
Glucose, Bld: 102 mg/dL — ABNORMAL HIGH (ref 70–99)
Potassium: 5.4 mEq/L — ABNORMAL HIGH (ref 3.5–5.3)
Sodium: 142 mEq/L (ref 135–145)

## 2013-03-11 LAB — LIPID PANEL
CHOLESTEROL: 120 mg/dL (ref 0–200)
HDL: 24 mg/dL — AB (ref >39)
LDL Cholesterol: 71 mg/dL (ref 0–99)
Total CHOL/HDL Ratio: 5 Ratio
Triglycerides: 126 mg/dL (ref <150)
VLDL: 25 mg/dL (ref 0–40)

## 2013-03-11 LAB — HEPATIC FUNCTION PANEL
ALBUMIN: 4.4 g/dL (ref 3.5–5.2)
ALK PHOS: 88 U/L (ref 39–117)
ALT: 18 U/L (ref 0–53)
AST: 18 U/L (ref 0–37)
BILIRUBIN INDIRECT: 1 mg/dL — AB (ref 0.0–0.9)
Bilirubin, Direct: 0.3 mg/dL (ref 0.0–0.3)
TOTAL PROTEIN: 7.4 g/dL (ref 6.0–8.3)
Total Bilirubin: 1.3 mg/dL — ABNORMAL HIGH (ref 0.3–1.2)

## 2013-03-11 LAB — TSH: TSH: 2.387 u[IU]/mL (ref 0.350–4.500)

## 2013-03-11 LAB — POCT GLYCOSYLATED HEMOGLOBIN (HGB A1C): Hemoglobin A1C: 5.4

## 2013-03-11 MED ORDER — POTASSIUM CHLORIDE ER 10 MEQ PO TBCR: 10.0000 meq | EXTENDED_RELEASE_TABLET | Freq: Every day | ORAL | Status: DC

## 2013-03-11 MED ORDER — CARVEDILOL 6.25 MG PO TABS: 6.2500 mg | ORAL_TABLET | Freq: Two times a day (BID) | ORAL | Status: DC

## 2013-03-11 MED ORDER — LISINOPRIL 20 MG PO TABS: ORAL_TABLET | ORAL | Status: DC

## 2013-03-11 MED ORDER — FUROSEMIDE 20 MG PO TABS: 60.0000 mg | ORAL_TABLET | Freq: Every day | ORAL | Status: DC

## 2013-03-11 NOTE — Patient Instructions (Signed)
Hypertension As your heart beats, it forces blood through your arteries. This force is your blood pressure. If the pressure is too high, it is called hypertension (HTN) or high blood pressure. HTN is dangerous because you may have it and not know it. High blood pressure may mean that your heart has to work harder to pump blood. Your arteries may be narrow or stiff. The extra work puts you at risk for heart disease, stroke, and other problems.  Blood pressure consists of two numbers, a higher number over a lower, 110/72, for example. It is stated as "110 over 72." The ideal is below 120 for the top number (systolic) and under 80 for the bottom (diastolic). Write down your blood pressure today. You should pay close attention to your blood pressure if you have certain conditions such as:  Heart failure.  Prior heart attack.  Diabetes  Chronic kidney disease.  Prior stroke.  Multiple risk factors for heart disease. To see if you have HTN, your blood pressure should be measured while you are seated with your arm held at the level of the heart. It should be measured at least twice. A one-time elevated blood pressure reading (especially in the Emergency Department) does not mean that you need treatment. There may be conditions in which the blood pressure is different between your right and left arms. It is important to see your caregiver soon for a recheck. Most people have essential hypertension which means that there is not a specific cause. This type of high blood pressure may be lowered by changing lifestyle factors such as:  Stress.  Smoking.  Lack of exercise.  Excessive weight.  Drug/tobacco/alcohol use.  Eating less salt. Most people do not have symptoms from high blood pressure until it has caused damage to the body. Effective treatment can often prevent, delay or reduce that damage. TREATMENT  When a cause has been identified, treatment for high blood pressure is directed at the  cause. There are a large number of medications to treat HTN. These fall into several categories, and your caregiver will help you select the medicines that are best for you. Medications may have side effects. You should review side effects with your caregiver. If your blood pressure stays high after you have made lifestyle changes or started on medicines,   Your medication(s) may need to be changed.  Other problems may need to be addressed.  Be certain you understand your prescriptions, and know how and when to take your medicine.  Be sure to follow up with your caregiver within the time frame advised (usually within two weeks) to have your blood pressure rechecked and to review your medications.  If you are taking more than one medicine to lower your blood pressure, make sure you know how and at what times they should be taken. Taking two medicines at the same time can result in blood pressure that is too low. SEEK IMMEDIATE MEDICAL CARE IF:  You develop a severe headache, blurred or changing vision, or confusion.  You have unusual weakness or numbness, or a faint feeling.  You have severe chest or abdominal pain, vomiting, or breathing problems. MAKE SURE YOU:   Understand these instructions.  Will watch your condition.  Will get help right away if you are not doing well or get worse. Document Released: 01/30/2005 Document Revised: 04/24/2011 Document Reviewed: 09/20/2007 [employer] Patient Information 2014 [employer].   Heart Failure Heart failure is a condition in which the heart has trouble pumping   blood. This means your heart does not pump blood efficiently for your body to work well. In some cases of heart failure, fluid may back up into your lungs or you may have swelling (edema) in your lower legs. Heart failure is usually a long-term (chronic) condition. It is important for you to take good care of yourself and follow your caregiver's treatment plan. CAUSES  Some health  conditions can cause heart failure. Those health conditions include:  High blood pressure (hypertension) causes the heart muscle to work harder than normal. When pressure in the blood vessels is high, the heart needs to pump (contract) with more force in order to circulate blood throughout the body. High blood pressure eventually causes the heart to become stiff and weak.  Coronary artery disease (CAD) is the buildup of cholesterol and fat (plaque) in the arteries of the heart. The blockage in the arteries deprives the heart muscle of oxygen and blood. This can cause chest pain and may lead to a heart attack. High blood pressure can also contribute to CAD.  Heart attack (myocardial infarction) occurs when 1 or more arteries in the heart become blocked. The loss of oxygen damages the muscle tissue of the heart. When this happens, part of the heart muscle dies. The injured tissue does not contract as well and weakens the heart's ability to pump blood.  Abnormal heart valves can cause heart failure when the heart valves do not open and close properly. This makes the heart muscle pump harder to keep the blood flowing.  Heart muscle disease (cardiomyopathy or myocarditis) is damage to the heart muscle from a variety of causes. These can include drug or alcohol abuse, infections, or unknown reasons. These can increase the risk of heart failure.  Lung disease makes the heart work harder because the lungs do not work properly. This can cause a strain on the heart, leading it to fail.  Diabetes increases the risk of heart failure. High blood sugar contributes to high fat (lipid) levels in the blood. Diabetes can also cause slow damage to tiny blood vessels that carry important nutrients to the heart muscle. When the heart does not get enough oxygen and food, it can cause the heart to become weak and stiff. This leads to a heart that does not contract efficiently.  Other conditions can contribute to heart  failure. These include abnormal heart rhythms, thyroid problems, and low blood counts (anemia). Certain unhealthy behaviors can increase the risk of heart failure. Those unhealthy behaviors include:  Being overweight.  Smoking or chewing tobacco.  Eating foods high in fat and cholesterol.  Abusing illicit drugs or alcohol.  Lacking physical activity. SYMPTOMS  Heart failure symptoms may vary and can be hard to detect. Symptoms may include:  Shortness of breath with activity, such as climbing stairs.  Persistent cough.  Swelling of the feet, ankles, legs, or abdomen.  Unexplained weight gain.  Difficulty breathing when lying flat (orthopnea).  Waking from sleep because of the need to sit up and get more air.  Rapid heartbeat.  Fatigue and loss of energy.  Feeling lightheaded, dizzy, or close to fainting.  Loss of appetite.  Nausea.  Increased urination during the night (nocturia). DIAGNOSIS  A diagnosis of heart failure is based on your history, symptoms, physical examination, and diagnostic tests. Diagnostic tests for heart failure may include:  Echocardiography.  Electrocardiography.  Chest X-ray.  Blood tests.  Exercise stress test.  Cardiac angiography.  Radionuclide scans. TREATMENT  Treatment is aimed   at managing the symptoms of heart failure. Medicines, behavioral changes, or surgical intervention may be necessary to treat heart failure.  Medicines to help treat heart failure may include:  Angiotensin-converting enzyme (ACE) inhibitors. This type of medicine blocks the effects of a blood protein called angiotensin-converting enzyme. ACE inhibitors relax (dilate) the blood vessels and help lower blood pressure.  Angiotensin receptor blockers. This type of medicine blocks the actions of a blood protein called angiotensin. Angiotensin receptor blockers dilate the blood vessels and help lower blood pressure.  Water pills (diuretics). Diuretics cause  the kidneys to remove salt and water from the blood. The extra fluid is removed through urination. This loss of extra fluid lowers the volume of blood the heart pumps.  Beta blockers. These prevent the heart from beating too fast and improve heart muscle strength.  Digitalis. This increases the force of the heartbeat.  Healthy behavior changes include:  Obtaining and maintaining a healthy weight.  Stopping smoking or chewing tobacco.  Eating heart healthy foods.  Limiting or avoiding alcohol.  Stopping illicit drug use.  Physical activity as directed by your caregiver.  Surgical treatment for heart failure may include:  A procedure to open blocked arteries, repair damaged heart valves, or remove damaged heart muscle tissue.  A pacemaker to improve heart muscle function and control certain abnormal heart rhythms.  An internal cardioverter defibrillator to treat certain serious abnormal heart rhythms.  A left ventricular assist device to assist the pumping ability of the heart. HOME CARE INSTRUCTIONS   Take your medicine as directed by your caregiver. Medicines are important in reducing the workload of your heart, slowing the progression of heart failure, and improving your symptoms.  Do not stop taking your medicine unless directed by your caregiver.  Do not skip any dose of medicine.  Refill your prescriptions before you run out of medicine. Your medicines are needed every day.  Take over-the-counter medicine only as directed by your caregiver or pharmacist.  Engage in moderate physical activity if directed by your caregiver. Moderate physical activity can benefit some people. The elderly and people with severe heart failure should consult with a caregiver for physical activity recommendations.  Eat heart healthy foods. Food choices should be free of trans fat and low in saturated fat, cholesterol, and salt (sodium). Healthy choices include fresh or frozen fruits and  vegetables, fish, lean meats, legumes, fat-free or low-fat dairy products, and whole grain or high fiber foods. Talk to a dietitian to learn more about heart healthy foods.  Limit sodium if directed by your caregiver. Sodium restriction may reduce symptoms of heart failure in some people. Talk to a dietitian to learn more about heart healthy seasonings.  Use healthy cooking methods. Healthy cooking methods include roasting, grilling, broiling, baking, poaching, steaming, or stir-frying. Talk to a dietitian to learn more about healthy cooking methods.  Limit fluids if directed by your caregiver. Fluid restriction may reduce symptoms of heart failure in some people.  Weigh yourself every day. Daily weights are important in the early recognition of excess fluid. You should weigh yourself every morning after you urinate and before you eat breakfast. Wear the same amount of clothing each time you weigh yourself. Record your daily weight. Provide your caregiver with your weight record.  Monitor and record your blood pressure if directed by your caregiver.  Check your pulse if directed by your caregiver.  Lose weight if directed by your caregiver. Weight loss may reduce symptoms of heart failure in some   people.  Stop smoking or chewing tobacco. Nicotine makes your heart work harder by causing your blood vessels to constrict. Do not use nicotine gum or patches before talking to your caregiver.  Schedule and attend follow-up visits as directed by your caregiver. It is important to keep all your appointments.  Limit alcohol intake to no more than 1 drink per day for nonpregnant women and 2 drinks per day for men. Drinking more than that is harmful to your heart. Tell your caregiver if you drink alcohol several times a week. Talk with your caregiver about whether alcohol is safe for you. If your heart has already been damaged by alcohol or you have severe heart failure, drinking alcohol should be stopped  completely.  Stop illicit drug use.  Stay up-to-date with immunizations. It is especially important to prevent respiratory infections through current pneumococcal and influenza immunizations.  Manage other health conditions such as hypertension, diabetes, thyroid disease, or abnormal heart rhythms as directed by your caregiver.  Learn to manage stress.  Plan rest periods when fatigued.  Learn strategies to manage high temperatures. If the weather is extremely hot:  Avoid vigorous physical activity.  Use air conditioning or fans or seek a cooler location.  Avoid caffeine and alcohol.  Wear loose-fitting, lightweight, and light-colored clothing.  Learn strategies to manage cold temperatures. If the weather is extremely cold:  Avoid vigorous physical activity.  Layer clothes.  Wear mittens or gloves, a hat, and a scarf when going outside.  Avoid alcohol.  Obtain ongoing education and support as needed.  Participate or seek rehabilitation as needed to maintain or improve independence and quality of life. SEEK MEDICAL CARE IF:   Your weight increases by 03 lb/1.4 kg in 1 day or 05 lb/2.3 kg in a week.  You have increasing shortness of breath that is unusual for you.  You are unable to participate in your usual physical activities.  You tire easily.  You cough more than normal, especially with physical activity.  You have any or more swelling in areas such as your hands, feet, ankles, or abdomen.  You are unable to sleep because it is hard to breathe.  You feel like your heart is beating fast (palpitations).  You become dizzy or lightheaded upon standing up. SEEK IMMEDIATE MEDICAL CARE IF:   You have difficulty breathing.  There is a change in mental status such as decreased alertness or difficulty with concentration.  You have a pain or discomfort in your chest.  You have an episode of fainting (syncope). MAKE SURE YOU:   Understand these  instructions.  Will watch your condition.  Will get help right away if you are not doing well or get worse. Document Released: 01/30/2005 Document Revised: 05/27/2012 Document Reviewed: 02/22/2012 [employer] Patient Information 2014 [employer].  

## 2013-03-11 NOTE — Progress Notes (Signed)
[facility] Pt is here following up on his CHF. Pt is requesting to have labs today and he is fasting for lipid profile.

## 2013-03-11 NOTE — Progress Notes (Signed)
Patient ID: Jesse Torres, male   DOB: [birth year], 63 y.o.   MRN: [id number] Patient Demographics  Jesse Torres, is a 63 y.o. male  WUJ:[id number]  [id number]:[id number]  DOB - March 20, 1950  CC:  Chief Complaint  Patient presents with  . Hospitalization Follow-up       HPI: Jesse Torres is a 63 y.o. male here today to establish medical care. Patient has no complaint today. He has hx of HTN, cardiomyopathy, pulmonary hypertension, chronic systolic heart failure and thrombocytopenia & obesity. He has had poor follow up for HTN due to lack of insurance. He was recently admitted 12/23-12/27 with new onset systolic CHF. He presented to the emergency room recently with symptoms of volume excess in the setting of markedly elevated blood pressure (180/110). Echocardiogram (02/05/2013): Mild LVH, EF 25-30%, diffuse HK, mild MR, mild LAE, mild RVE, moderately reduced RVSF, mild RAE, PASP 64, trivial effusion. Patient was seen by Dr. Myrtis Ser. CHF medications were initiated. He was diuresed and lost 9 pounds. He was set up for an inpatient nuclear stress test. 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 Overall, cardiomyopathy was felt to likely be from untreated HTN. Been told to have a primary care physician to manage his blood pressure. He is compliant with medication now though he has been noncompliant for a long time. He also have obstructive sleep apnea yet to do a sleep study and his not on CPAP at the moment. He tolerates about 2 blocks of walking before shortness of breath, denies chest pain  Patient has No headache, No abdominal pain - No Nausea, No new weakness tingling or numbness, No Cough - SOB.  No Known Allergies Past Medical History  Diagnosis Date  . Hypertension   . 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  . Snoring      needs sleep study  . Pulmonary HTN   . Chronic systolic CHF (congestive heart failure)     a. Echocardiogram (02/05/2013): Mild LVH, EF 25-30%, diffuse HK, mild MR, mild LAE, mild RVE, moderately reduced RVSF, mild RAE, PASP 64, trivial effusion.  . Thrombocytopenia    Current Outpatient Prescriptions on File Prior to Visit  Medication Sig Dispense Refill  . aspirin EC 81 MG tablet Take 1 tablet (81 mg total) by mouth daily.      . naproxen sodium (ANAPROX) 220 MG tablet Take 440 mg by mouth daily as needed (pain).       No current facility-administered medications on file prior to visit.   History reviewed. No pertinent family history. History   Social History  . Marital Status: Married    Spouse Name: N/A    Number of Children: N/A  . Years of Education: N/A   Occupational History  . Not on file.   Social History Main Topics  . Smoking status: Former Smoker    Types: Cigarettes    Quit date: 02/06/1995  . Smokeless tobacco: Not on file  . Alcohol Use: No  . Drug Use: No  . Sexual Activity: Not on file   Other Topics Concern  . Not on file   Social History Narrative  . No narrative on file    Review of Systems: Constitutional: Negative for fever, chills, diaphoresis, activity change, appetite change and fatigue. HENT: Negative for ear pain, nosebleeds, congestion, facial swelling, rhinorrhea, neck pain, neck stiffness and ear discharge.  Eyes: Negative for pain, discharge, redness, itching and visual disturbance. Respiratory: Negative for cough, choking, chest tightness, shortness of breath, wheezing and stridor.  Cardiovascular: Negative for chest pain, palpitations and leg swelling. Gastrointestinal: Negative for abdominal distention. Genitourinary: Negative for dysuria, urgency, frequency, hematuria, flank pain, decreased urine volume, difficulty urinating and dyspareunia.  Musculoskeletal: Negative for back pain, joint swelling, arthralgia and gait  problem. Neurological: Negative for dizziness, tremors, seizures, syncope, facial asymmetry, speech difficulty, weakness, light-headedness, numbness and headaches.  Hematological: Negative for adenopathy. Does not bruise/bleed easily. Psychiatric/Behavioral: Negative for hallucinations, behavioral problems, confusion, dysphoric mood, decreased concentration and agitation.    Objective:   Filed Vitals:   03/11/13 1103  BP: 138/74  Pulse: 45  Temp: 97.5 F (36.4 C)  Resp: 14    Physical Exam: Constitutional: Patient appears well-developed and well-nourished. No distress. Obese HENT: Normocephalic, atraumatic, External right and left ear normal. Oropharynx is clear and moist.  Eyes: Conjunctivae and EOM are normal. PERRLA, no scleral icterus. Neck: Normal ROM. Neck supple. No JVD. No tracheal deviation. No thyromegaly. CVS: Irregular heart rate, S1/S2 + ectopic beats, no murmurs, no gallops, no carotid bruit.  Pulmonary: Effort and breath sounds normal, no stridor, rhonchi, wheezes, rales.  Abdominal: Soft. BS +, no distension, tenderness, rebound or guarding.  Musculoskeletal: Normal range of motion. No edema and no tenderness.  Lymphadenopathy: No lymphadenopathy noted, cervical, inguinal or axillary Neuro: Alert. Normal reflexes, muscle tone coordination. No cranial nerve deficit. Skin: Skin is warm and dry. No rash noted. Not diaphoretic. No erythema. No pallor. Psychiatric: Normal mood and affect. Behavior, judgment, thought content normal.  Lab Results  Component Value Date   WBC 4.6 02/25/2013   HGB 13.3 02/25/2013   HCT 40.9 02/25/2013   MCV 78.4 02/25/2013   PLT 57.0 Result may be falsely decreased due to platelet clumping.* 02/25/2013   Lab Results  Component Value Date   CREATININE 1.1 02/25/2013   BUN 22 02/25/2013   NA 141 02/25/2013   K 4.1 02/25/2013   CL 103 02/25/2013   CO2 31 02/25/2013    Lab Results  Component Value Date   HGBA1C 5.4 03/11/2013   Lipid Panel   No results found for this basename: chol, trig, hdl, cholhdl, vldl, ldlcalc       Assessment and plan:   1. CHF (congestive heart failure), NYHA class III  - Basic Metabolic Panel - Lipid Panel - Hepatic Function Panel - TSH - HgB A1c  - potassium chloride (K-DUR) 10 MEQ tablet; Take 1 tablet (10 mEq total) by mouth daily.  Dispense: 90 tablet; Refill: 3 - furosemide (LASIX) 20 MG tablet; Take 3 tablets (60 mg total) by mouth daily.  Dispense: 90 tablet; Refill: 3 - carvedilol (COREG) 6.25 MG tablet; Take 1 tablet (6.25 mg total) by mouth 2 (two) times daily.  Dispense: 180 tablet; Refill: 3  2. HTN (hypertension)  - lisinopril (PRINIVIL,ZESTRIL) 20 MG tablet; TAKE 20 MG IN THE AM AND 10 MG IN THE PM  Dispense: 135 tablet; Refill: 3 - carvedilol (COREG) 6.25 MG tablet; Take 1 tablet (6.25 mg total) by mouth 2 (two) times daily.  Dispense: 180 tablet; Refill: 3  3. Dyslipidemia Lipid panel today  4. Cardiomyopathy  - lisinopril (PRINIVIL,ZESTRIL) 20 MG tablet; TAKE 20 MG IN THE AM AND 10 MG IN THE PM  Dispense: 135 tablet; Refill: 3  5. Chronic systolic CHF (congestive heart failure)  - lisinopril (PRINIVIL,ZESTRIL) 20 MG tablet; TAKE 20 MG IN THE AM  AND 10 MG IN THE PM  Dispense: 135 tablet; Refill: 3  Patient was extensively counseled on nutrition and exercise I will arrange for sleep study  Follow up in 3 months or when necessary    The patient was given clear instructions to go to ER or return to medical center if symptoms don't improve, worsen or new problems develop. The patient verbalized understanding. The patient was told to call to get lab results if they haven't heard anything in the next week.     Jeanann Lewandowsky, MD, MHA, [facility] [place], [place] [phone]   03/11/2013, 5:44 PM

## 2013-03-14 ENCOUNTER — Telehealth: Payer: Self-pay | Admitting: *Deleted

## 2013-03-14 NOTE — Telephone Encounter (Signed)
Left message 1st attempt.  

## 2013-03-14 NOTE — Telephone Encounter (Signed)
Message copied by Raynelle Chary on Fri Mar 14, 2013  9:22 AM ------      Message from: Jeanann Lewandowsky E      Created: Wed Mar 12, 2013  9:28 AM       Please call patient to inform him that his lab results came back, his potassium level is high we advised that he stop taking the potassium tablets and come back on Friday morning for repeat potassium level. His cholesterol level is normal as well as his thyroid function. ------

## 2013-03-17 ENCOUNTER — Telehealth: Payer: Self-pay | Admitting: Internal Medicine

## 2013-03-17 DIAGNOSIS — E875 Hyperkalemia: Secondary | ICD-10-CM

## 2013-03-17 NOTE — Telephone Encounter (Signed)
Spoke with patient Instructed him to stop taking the potassium and gave him an appt. To repeat blood work on Friday 03/21/13

## 2013-03-17 NOTE — Telephone Encounter (Signed)
Pt calling about results for lab work.  Please f/u with pt.

## 2013-03-21 ENCOUNTER — Ambulatory Visit: Payer: Medicaid Other | Attending: Internal Medicine

## 2013-03-21 DIAGNOSIS — I5022 Chronic systolic (congestive) heart failure: Secondary | ICD-10-CM

## 2013-03-21 DIAGNOSIS — I429 Cardiomyopathy, unspecified: Secondary | ICD-10-CM

## 2013-03-21 DIAGNOSIS — E875 Hyperkalemia: Secondary | ICD-10-CM

## 2013-03-21 LAB — POTASSIUM: Potassium: 5.3 mEq/L (ref 3.5–5.3)

## 2013-03-22 LAB — BASIC METABOLIC PANEL
BUN: 25 mg/dL — AB (ref 6–23)
CHLORIDE: 100 meq/L (ref 96–112)
CO2: 33 meq/L — AB (ref 19–32)
CREATININE: 1.14 mg/dL (ref 0.50–1.35)
Calcium: 9.8 mg/dL (ref 8.4–10.5)
Glucose, Bld: 103 mg/dL — ABNORMAL HIGH (ref 70–99)
Potassium: 5.3 mEq/L (ref 3.5–5.3)
Sodium: 140 mEq/L (ref 135–145)

## 2013-03-22 LAB — HEPATIC FUNCTION PANEL
ALK PHOS: 87 U/L (ref 39–117)
ALT: 14 U/L (ref 0–53)
AST: 18 U/L (ref 0–37)
Albumin: 4.4 g/dL (ref 3.5–5.2)
BILIRUBIN INDIRECT: 0.8 mg/dL (ref 0.2–1.2)
BILIRUBIN TOTAL: 1.1 mg/dL (ref 0.2–1.2)
Bilirubin, Direct: 0.3 mg/dL (ref 0.0–0.3)
Total Protein: 7 g/dL (ref 6.0–8.3)

## 2013-03-22 LAB — LIPID PANEL
Cholesterol: 118 mg/dL (ref 0–200)
HDL: 30 mg/dL — ABNORMAL LOW (ref >39)
LDL Cholesterol: 70 mg/dL (ref 0–99)
Total CHOL/HDL Ratio: 3.9 Ratio
Triglycerides: 91 mg/dL (ref <150)
VLDL: 18 mg/dL (ref 0–40)

## 2013-03-24 ENCOUNTER — Telehealth: Payer: Self-pay | Admitting: *Deleted

## 2013-03-24 NOTE — Telephone Encounter (Signed)
Left message 1st attempt.  

## 2013-03-25 ENCOUNTER — Encounter: Payer: Self-pay | Admitting: *Deleted

## 2013-03-28 ENCOUNTER — Encounter: Payer: Self-pay | Admitting: Physician Assistant

## 2013-03-28 ENCOUNTER — Ambulatory Visit (INDEPENDENT_AMBULATORY_CARE_PROVIDER_SITE_OTHER): Payer: Self-pay | Admitting: Physician Assistant

## 2013-03-28 VITALS — BP 120/70 | HR 56 | Ht 67.0 in | Wt 209.0 lb

## 2013-03-28 DIAGNOSIS — I2510 Atherosclerotic heart disease of native coronary artery without angina pectoris: Secondary | ICD-10-CM

## 2013-03-28 DIAGNOSIS — E875 Hyperkalemia: Secondary | ICD-10-CM

## 2013-03-28 DIAGNOSIS — I10 Essential (primary) hypertension: Secondary | ICD-10-CM

## 2013-03-28 DIAGNOSIS — I5022 Chronic systolic (congestive) heart failure: Secondary | ICD-10-CM

## 2013-03-28 DIAGNOSIS — I428 Other cardiomyopathies: Secondary | ICD-10-CM

## 2013-03-28 DIAGNOSIS — I272 Pulmonary hypertension, unspecified: Secondary | ICD-10-CM

## 2013-03-28 DIAGNOSIS — I429 Cardiomyopathy, unspecified: Secondary | ICD-10-CM

## 2013-03-28 DIAGNOSIS — I2789 Other specified pulmonary heart diseases: Secondary | ICD-10-CM

## 2013-03-28 DIAGNOSIS — I509 Heart failure, unspecified: Secondary | ICD-10-CM

## 2013-03-28 LAB — BASIC METABOLIC PANEL
BUN: 27 mg/dL — AB (ref 6–23)
CHLORIDE: 103 meq/L (ref 96–112)
CO2: 29 mEq/L (ref 19–32)
Calcium: 9.1 mg/dL (ref 8.4–10.5)
Creatinine, Ser: 1.3 mg/dL (ref 0.4–1.5)
GFR: 60.44 mL/min (ref >60.00)
GLUCOSE: 89 mg/dL (ref 70–99)
Potassium: 4.6 mEq/L (ref 3.5–5.1)
Sodium: 139 mEq/L (ref 135–145)

## 2013-03-28 MED ORDER — PRAVASTATIN SODIUM 10 MG PO TABS: 10.0000 mg | ORAL_TABLET | Freq: Every day | ORAL | Status: DC

## 2013-03-28 NOTE — Patient Instructions (Addendum)
Your physician has requested that you have an echocardiogram DX 428.22, 425.4; THIS IS TO BE DONE A FEW DAYS BEFORE APPT WITH DR. KATZ. Echocardiography is a painless test that uses sound waves to create images of your heart. It provides your doctor with information about the size and shape of your heart and how well your heart's chambers and valves are working. This procedure takes approximately one hour. There are no restrictions for this procedure.  LAB WORK TODAY BMET  START PRAVASTATIN 10 MG EVERY NIGHT; RX CALLED INTO THE [facility]  YOU WILL NEED FASTING LIPID AND LIVER PANEL TO BE IN 2 MONTHS  HERE IS A LIST OF SOME POTASSIUM RICH FOODS TO AVOID  POTATOES (SWEET OR WHITE) CANTALOUPE BEANS, BAKED, BLACK TOMATOES ORANGE JUICE BANANA'S

## 2013-03-28 NOTE — Progress Notes (Signed)
[address] [place], [place]  [address] Phone: [phone] Fax:  [phone]  Date:  03/28/2013   ID:  JENO DEBOCK, DOB 08-31-1950, MRN [id number]  PCP:  Jeanann Lewandowsky, MD  Cardiologist:  Dr. Zackery Barefoot     History of Present Illness: ZOHAN EMERT is a 63 y.o. male with a hx of HTN & obesity. He has had poor follow up for HTN due to lack of insurance. He was recently admitted 01/2013 with new onset systolic CHF.  He presented to the emergency room with symptoms of volume excess in the setting of markedly elevated blood pressure (180/110). Echocardiogram (02/05/2013): Mild LVH, EF 25-30%, diffuse HK, mild MR, mild LAE, mild RVE, moderately reduced RVSF, mild RAE, PASP 64, trivial effusion.  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  Overall, cardiomyopathy was felt to likely be from untreated HTN. With significant pulmonary hypertension and history of snoring, outpatient sleep study was also recommended.   I saw him last month in f/u.  I adjust his medications. We opted to hold off on arranging sleep study until he had insurance coverage. He returns for further medication titration.  He returns for follow up. He has been doing well. He denies chest pain, shortness of breath, syncope, orthopnea, PND or edema. He is NYHA class II-IIb.  Recent Labs: 02/08/2013: Pro B Natriuretic peptide (BNP) 2740.0*  02/25/2013: Hemoglobin 13.3  03/11/2013: TSH 2.387  03/21/2013: ALT 14; Creatinine 1.14; HDL Cholesterol 30*; LDL (calc) 70; Potassium 5.3   Wt Readings from Last 3 Encounters:  03/28/13 209 lb (94.802 kg)  03/11/13 211 lb (95.709 kg)  02/25/13 216 lb (97.977 kg)     Past Medical History  Diagnosis Date  . Hypertension   . 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  . Snoring     needs  sleep study  . Pulmonary HTN   . Chronic systolic CHF (congestive heart failure)     a. Echocardiogram (02/05/2013): Mild LVH, EF 25-30%, diffuse HK, mild MR, mild LAE, mild RVE, moderately reduced RVSF, mild RAE, PASP 64, trivial effusion.  . Thrombocytopenia     Current Outpatient Prescriptions  Medication Sig Dispense Refill  . aspirin EC 81 MG tablet Take 1 tablet (81 mg total) by mouth daily.      . carvedilol (COREG) 6.25 MG tablet Take 1 tablet (6.25 mg total) by mouth 2 (two) times daily.  180 tablet  3  . furosemide (LASIX) 20 MG tablet Take 3 tablets (60 mg total) by mouth daily.  90 tablet  3  . lisinopril (PRINIVIL,ZESTRIL) 20 MG tablet TAKE 20 MG IN THE AM AND 10 MG IN THE PM  135 tablet  3  . naproxen sodium (ANAPROX) 220 MG tablet Take 440 mg by mouth daily as needed (pain).      . potassium chloride (K-DUR) 10 MEQ tablet Take 1 tablet (10 mEq total) by mouth daily.  90 tablet  3   No current facility-administered medications for this visit.    Allergies:   Review of patient's allergies indicates no known allergies.   Social History:  The patient  reports that he quit smoking about 18 years ago. His smoking use included Cigarettes. He smoked 0.00 packs per day. He does not have any smokeless tobacco history on file. He reports that he does not drink  alcohol or use illicit drugs.   Family History:  The patient's family history is not on file.   ROS:  Please see the history of present illness.    All other systems reviewed and negative.   PHYSICAL EXAM: VS:  BP 120/70  Pulse 56  Ht 5\' 7"  (1.702 m)  Wt 209 lb (94.802 kg)  BMI 32.73 kg/m2 Well nourished, well developed, in no acute distress HEENT: normal Neck: no JVD Cardiac:  normal S1, S2; RRR; no murmur Lungs:  clear to auscultation bilaterally, no wheezing, rhonchi or rales Abd: soft, nontender, no hepatomegaly Ext: no edema Skin: warm and dry Neuro:  CNs 2-12 intact, no focal abnormalities noted  EKG:  Sinus  bradycardia, HR 56, RBBB, frequent PVCs, no change from prior tracing     ASSESSMENT AND PLAN:  1. Chronic Systolic CHF:  Volume stable. Continue current therapy. 2. Hyperkalemia: He has had some elevations in his potassium on recent blood work.  Potassium supplementation has been discontinued. Repeat basic metabolic panel today. 3. 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  Continue beta blocker, ACEI.  Arrange follow up Echo next month prior to his visit with Dr. Myrtis SerKatz.  If EF remains < 35%, he will need referral to EP for possible ICD. 4. CAD:  Suspect he has underlying CAD given apical scar on myoview.  Continue ASA.  Recent LDL optimal.  Will place him on Pravastatin 10 QHS for secondary prevention.  Check L/L in 2 mos.  5. Hypertension:  Controlled.   6. Pulmonary HTN:  Will wait until he has full insurance coverage before arranging sleep study.   7. Disposition:  F/u with Dr. Zackery BarefootJeffery Katz next month as planned.   Signed, Tereso NewcomerScott Satine Hausner, PA-C  03/28/2013 11:34 AM

## 2013-03-31 ENCOUNTER — Telehealth: Payer: Self-pay | Admitting: *Deleted

## 2013-03-31 DIAGNOSIS — I509 Heart failure, unspecified: Secondary | ICD-10-CM

## 2013-03-31 DIAGNOSIS — I10 Essential (primary) hypertension: Secondary | ICD-10-CM

## 2013-03-31 MED ORDER — FUROSEMIDE 20 MG PO TABS: 40.0000 mg | ORAL_TABLET | Freq: Every day | ORAL | Status: DC

## 2013-03-31 NOTE — Telephone Encounter (Signed)
pt notified about lab results, bmet 2/27, verified pt is NOT taking K+, advised to hold K+ and to decrease lasix to 40 mg daily. Pt verbalized understanding to Plan of Care.

## 2013-04-11 ENCOUNTER — Other Ambulatory Visit (INDEPENDENT_AMBULATORY_CARE_PROVIDER_SITE_OTHER): Payer: Self-pay

## 2013-04-11 DIAGNOSIS — I509 Heart failure, unspecified: Secondary | ICD-10-CM

## 2013-04-11 DIAGNOSIS — I10 Essential (primary) hypertension: Secondary | ICD-10-CM

## 2013-04-11 LAB — BASIC METABOLIC PANEL
BUN: 22 mg/dL (ref 6–23)
CALCIUM: 9.5 mg/dL (ref 8.4–10.5)
CO2: 33 mEq/L — ABNORMAL HIGH (ref 19–32)
Chloride: 103 mEq/L (ref 96–112)
Creatinine, Ser: 1.1 mg/dL (ref 0.4–1.5)
GFR: 75.12 mL/min (ref >60.00)
Glucose, Bld: 82 mg/dL (ref 70–99)
POTASSIUM: 4 meq/L (ref 3.5–5.1)
Sodium: 140 mEq/L (ref 135–145)

## 2013-04-14 ENCOUNTER — Telehealth: Payer: Self-pay | Admitting: *Deleted

## 2013-04-14 NOTE — Telephone Encounter (Signed)
pt notified about lab results with verbal understanding  

## 2013-05-05 ENCOUNTER — Encounter: Payer: Self-pay | Admitting: Cardiology

## 2013-05-05 DIAGNOSIS — I2510 Atherosclerotic heart disease of native coronary artery without angina pectoris: Secondary | ICD-10-CM | POA: Insufficient documentation

## 2013-05-06 ENCOUNTER — Telehealth: Payer: Self-pay | Admitting: *Deleted

## 2013-05-06 ENCOUNTER — Ambulatory Visit (HOSPITAL_COMMUNITY): Payer: Self-pay | Attending: Physician Assistant | Admitting: Radiology

## 2013-05-06 ENCOUNTER — Encounter: Payer: Self-pay | Admitting: Physician Assistant

## 2013-05-06 DIAGNOSIS — I429 Cardiomyopathy, unspecified: Secondary | ICD-10-CM

## 2013-05-06 DIAGNOSIS — I428 Other cardiomyopathies: Secondary | ICD-10-CM | POA: Insufficient documentation

## 2013-05-06 DIAGNOSIS — I5022 Chronic systolic (congestive) heart failure: Secondary | ICD-10-CM | POA: Insufficient documentation

## 2013-05-06 NOTE — Progress Notes (Signed)
Echocardiogram performed.  

## 2013-05-06 NOTE — Telephone Encounter (Signed)
pt notified about echo results and will need to be referred to EP due to EF 30-35%. Advised to keep f/u w/Dr. Myrtis Ser 3/25 to discuss echo further. Pt said ok and thank you.

## 2013-05-07 ENCOUNTER — Encounter: Payer: Self-pay | Admitting: Cardiology

## 2013-05-07 ENCOUNTER — Ambulatory Visit (INDEPENDENT_AMBULATORY_CARE_PROVIDER_SITE_OTHER): Payer: Self-pay | Admitting: Cardiology

## 2013-05-07 ENCOUNTER — Telehealth: Payer: Self-pay | Admitting: *Deleted

## 2013-05-07 VITALS — BP 138/70 | HR 68 | Ht 67.0 in | Wt 210.0 lb

## 2013-05-07 DIAGNOSIS — I2789 Other specified pulmonary heart diseases: Secondary | ICD-10-CM

## 2013-05-07 DIAGNOSIS — Z91199 Patient's noncompliance with other medical treatment and regimen due to unspecified reason: Secondary | ICD-10-CM

## 2013-05-07 DIAGNOSIS — G4733 Obstructive sleep apnea (adult) (pediatric): Secondary | ICD-10-CM

## 2013-05-07 DIAGNOSIS — I10 Essential (primary) hypertension: Secondary | ICD-10-CM

## 2013-05-07 DIAGNOSIS — I428 Other cardiomyopathies: Secondary | ICD-10-CM

## 2013-05-07 DIAGNOSIS — E785 Hyperlipidemia, unspecified: Secondary | ICD-10-CM

## 2013-05-07 DIAGNOSIS — Z9119 Patient's noncompliance with other medical treatment and regimen: Secondary | ICD-10-CM

## 2013-05-07 DIAGNOSIS — I272 Pulmonary hypertension, unspecified: Secondary | ICD-10-CM

## 2013-05-07 DIAGNOSIS — I5022 Chronic systolic (congestive) heart failure: Secondary | ICD-10-CM

## 2013-05-07 DIAGNOSIS — I509 Heart failure, unspecified: Secondary | ICD-10-CM

## 2013-05-07 DIAGNOSIS — R9430 Abnormal result of cardiovascular function study, unspecified: Secondary | ICD-10-CM | POA: Insufficient documentation

## 2013-05-07 DIAGNOSIS — I429 Cardiomyopathy, unspecified: Secondary | ICD-10-CM

## 2013-05-07 MED ORDER — FUROSEMIDE 20 MG PO TABS: 40.0000 mg | ORAL_TABLET | Freq: Every day | ORAL | Status: DC

## 2013-05-07 MED ORDER — CARVEDILOL 6.25 MG PO TABS: 12.5000 mg | ORAL_TABLET | Freq: Two times a day (BID) | ORAL | Status: DC

## 2013-05-07 MED ORDER — LISINOPRIL 20 MG PO TABS: 20.0000 mg | ORAL_TABLET | Freq: Two times a day (BID) | ORAL | Status: DC

## 2013-05-07 NOTE — Assessment & Plan Note (Signed)
His PA pressure was in a better range at 46 mm mercury with his most recent echo.

## 2013-05-07 NOTE — Telephone Encounter (Signed)
Patient called requesting to speak with you. He was to give you the name of his pharmacy and it is the [facility]. He has the printed rx that you gave him, but he states that you are going to call in other meds for him but he was not sure what they were. Please advise. Thanks, MI

## 2013-05-07 NOTE — Patient Instructions (Addendum)
**Note De-Identified Jesse Torres Obfuscation** Your physician has recommended you make the following change in your medication: increase Lisinopril to 20 mg twice daily then 1 week later increase increase Carvedilol to 12.5 mg (take 2 tablets of your 6.25 mg tablets twice daily) twice daily.  Your physician recommends that you schedule a follow-up appointment in: 4 weeks

## 2013-05-07 NOTE — Assessment & Plan Note (Signed)
He is on a small dose of a statin at this time.

## 2013-05-07 NOTE — Assessment & Plan Note (Addendum)
The patient has a cardiomyopathy. We suspected is hypertensive in origin. However there may be a component of coronary disease. He had a nuclear scan in the hospital in December showing no ischemia. There was question of an apical scar that was small. He is on carvedilol and an ACE inhibitor. There is room with his blood pressure to push his medicines higher. Lisinopril will be increased to 20 twice a day. Carvedilol will be increased slowly to 12.5 mg twice a day. I will then see him back for followup. I had a long and careful discussion with him about possible ICD placement. Since he has had some improvement in his LV function, I decided to continue his medical therapy longer before final decision is made about possible ICD. He is in agreement with this approach.  As part of today's evaluation I spent greater than 25 minutes with his total care. 1-1/2 of this time has been spent with direct contact with him reviewing all of the information and discussing possible ICD in the future.

## 2013-05-07 NOTE — Assessment & Plan Note (Signed)
His congestive heart failure is controlled at this time on his current medications. We're watching his renal function carefully and will be checked again today.

## 2013-05-07 NOTE — Telephone Encounter (Signed)
**Note De-Identified Niasia Lanphear Obfuscation** The pt did not know the name of his pharmacy at his OV this am and called back to let us know. [facility] has been added as the pts pharmacy in his chart.

## 2013-05-07 NOTE — Assessment & Plan Note (Signed)
He is doing much better at this point. He and I talked about how he has his medicines prepared so that he does not miss any

## 2013-05-07 NOTE — Assessment & Plan Note (Signed)
He will need to have a sleep study evaluation at some point.

## 2013-05-07 NOTE — Assessment & Plan Note (Signed)
Blood pressure is under much better control at this time. However we will 1 it even lower with his cardiomyopathy.

## 2013-05-07 NOTE — Progress Notes (Signed)
Patient ID: Jesse Torres, male   DOB: [birth year], 63 y.o.   MRN: [id number]    HPI  Patient is seen today to followup his cardiomyopathy. I had seen him when he was in the hospital. He was then seen in our office in followup by Mr. Alben Spittle. His meds are being adjusted. His potassium was high. Followup showed that it had normalized. He has been told carefully not to be taking any type of potassium supplement. This may limit our ability to use spironolactone that has not been tried yet. In the past he was noncompliant with his medicines. Currently he seems to be doing a very good job. He had a followup 2-D echo done yesterday showing some improvement in LV function. However his EF is still low in the range of 30-35%.  As part of today's evaluation I have reviewed the hospital information including reviewing my personal progress notes at that time. I have reviewed the note by Mr. Alben Spittle. I have updated the current electronic medical record. No Known Allergies  Current Outpatient Prescriptions  Medication Sig Dispense Refill  . aspirin EC 81 MG tablet Take 1 tablet (81 mg total) by mouth daily.      . carvedilol (COREG) 6.25 MG tablet Take 1 tablet (6.25 mg total) by mouth 2 (two) times daily.  180 tablet  3  . furosemide (LASIX) 20 MG tablet Take 2 tablets (40 mg total) by mouth daily.      Marland Kitchen lisinopril (PRINIVIL,ZESTRIL) 20 MG tablet TAKE 20 MG IN THE AM AND 10 MG IN THE PM  135 tablet  3  . pravastatin (PRAVACHOL) 10 MG tablet Take 1 tablet (10 mg total) by mouth daily.  90 tablet  3   No current facility-administered medications for this visit.    History   Social History  . Marital Status: Married    Spouse Name: N/A    Number of Children: N/A  . Years of Education: N/A   Occupational History  . Not on file.   Social History Main Topics  . Smoking status: Former Smoker    Types: Cigarettes    Quit date: 02/06/1995  . Smokeless tobacco: Not on file  . Alcohol Use: No  . Drug Use:  No  . Sexual Activity: Not on file   Other Topics Concern  . Not on file   Social History Narrative  . No narrative on file    No family history on file.  Past Medical History  Diagnosis Date  . Hypertension   . 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  . Snoring     needs sleep study  . Pulmonary HTN   . Chronic systolic CHF (congestive heart failure)     a. Echocardiogram (02/05/2013): Mild LVH, EF 25-30%, diffuse HK, mild MR, mild LAE, mild RVE, moderately reduced RVSF, mild RAE, PASP 64, trivial effusion.;  b.  Echo (04/2013): EF 30-35%, diffuse HK, Gr 2 DD, mod LAE, mildly reduced RVSF, mild RAE, PASP 46  . Thrombocytopenia   . Ejection fraction < 50%     History reviewed. No pertinent past surgical history.  Patient Active Problem List   Diagnosis Date Noted  . Ejection fraction < 50%   . CAD (coronary artery disease) 05/05/2013  . Dyslipidemia 03/11/2013  . Cardiomyopathy   . Chronic systolic CHF (congestive heart failure)   . Thrombocytopenia   . Snoring   .  Right ventricular dysfunction 02/06/2013  . Pulmonary hypertension 02/06/2013  . RBBB (right bundle branch block) 02/06/2013  . HTN (hypertension) 02/05/2013  . Sleep apnea, obstructive 02/05/2013  . Obesity 02/05/2013  . History of noncompliance with medical treatment 02/05/2013    ROS   Patient denies fever, chills, headache, sweats, rash, change in vision, change in hearing, chest pain, cough, nausea vomiting, urinary symptoms. All other systems are reviewed and are negative.  PHYSICAL EXAM  Patient is oriented to person time and place. Affect is normal. There is no jugulovenous distention. Lungs are clear. Respiratory effort is nonlabored. Cardiac exam reveals S1 and S2. There no clicks or significant murmurs. The abdomen is soft. There is no peripheral edema. There no musculoskeletal  deformities. There are no skin rashes.  Filed Vitals:   05/07/13 0748  BP: 138/70  Pulse: 68  Height: 5\' 7"  (1.702 m)  Weight: 210 lb (95.255 kg)     ASSESSMENT & PLAN

## 2013-05-26 ENCOUNTER — Other Ambulatory Visit (INDEPENDENT_AMBULATORY_CARE_PROVIDER_SITE_OTHER): Payer: Self-pay

## 2013-05-26 DIAGNOSIS — I428 Other cardiomyopathies: Secondary | ICD-10-CM

## 2013-05-26 DIAGNOSIS — I429 Cardiomyopathy, unspecified: Secondary | ICD-10-CM

## 2013-05-26 DIAGNOSIS — I5022 Chronic systolic (congestive) heart failure: Secondary | ICD-10-CM

## 2013-05-26 DIAGNOSIS — I509 Heart failure, unspecified: Secondary | ICD-10-CM

## 2013-05-26 LAB — LIPID PANEL
Cholesterol: 93 mg/dL (ref 0–200)
HDL: 29.1 mg/dL — ABNORMAL LOW (ref >39.00)
LDL Cholesterol: 45 mg/dL (ref 0–99)
Total CHOL/HDL Ratio: 3
Triglycerides: 96 mg/dL (ref 0.0–149.0)
VLDL: 19.2 mg/dL (ref 0.0–40.0)

## 2013-05-26 LAB — HEPATIC FUNCTION PANEL
ALT: 15 U/L (ref 0–53)
AST: 14 U/L (ref 0–37)
Albumin: 3.8 g/dL (ref 3.5–5.2)
Alkaline Phosphatase: 73 U/L (ref 39–117)
Bilirubin, Direct: 0.2 mg/dL (ref 0.0–0.3)
Total Bilirubin: 0.8 mg/dL (ref 0.3–1.2)
Total Protein: 6.7 g/dL (ref 6.0–8.3)

## 2013-06-05 ENCOUNTER — Ambulatory Visit: Payer: Self-pay | Attending: Internal Medicine | Admitting: Internal Medicine

## 2013-06-05 ENCOUNTER — Encounter: Payer: Self-pay | Admitting: Internal Medicine

## 2013-06-05 VITALS — BP 171/78 | HR 63 | Temp 98.7°F | Resp 17

## 2013-06-05 DIAGNOSIS — Z87891 Personal history of nicotine dependence: Secondary | ICD-10-CM | POA: Insufficient documentation

## 2013-06-05 DIAGNOSIS — I10 Essential (primary) hypertension: Secondary | ICD-10-CM

## 2013-06-05 DIAGNOSIS — I2510 Atherosclerotic heart disease of native coronary artery without angina pectoris: Secondary | ICD-10-CM | POA: Insufficient documentation

## 2013-06-05 DIAGNOSIS — I509 Heart failure, unspecified: Secondary | ICD-10-CM

## 2013-06-05 DIAGNOSIS — I429 Cardiomyopathy, unspecified: Secondary | ICD-10-CM

## 2013-06-05 DIAGNOSIS — I5022 Chronic systolic (congestive) heart failure: Secondary | ICD-10-CM

## 2013-06-05 DIAGNOSIS — Z760 Encounter for issue of repeat prescription: Secondary | ICD-10-CM | POA: Insufficient documentation

## 2013-06-05 DIAGNOSIS — E785 Hyperlipidemia, unspecified: Secondary | ICD-10-CM | POA: Insufficient documentation

## 2013-06-05 DIAGNOSIS — I428 Other cardiomyopathies: Secondary | ICD-10-CM

## 2013-06-05 MED ORDER — CARVEDILOL 12.5 MG PO TABS: 12.5000 mg | ORAL_TABLET | Freq: Two times a day (BID) | ORAL | Status: DC

## 2013-06-05 MED ORDER — ASPIRIN EC 81 MG PO TBEC: 81.0000 mg | DELAYED_RELEASE_TABLET | Freq: Every day | ORAL | Status: DC

## 2013-06-05 MED ORDER — SIMVASTATIN 5 MG PO TABS: 5.0000 mg | ORAL_TABLET | Freq: Every day | ORAL | Status: DC

## 2013-06-05 MED ORDER — LISINOPRIL 20 MG PO TABS: 20.0000 mg | ORAL_TABLET | Freq: Two times a day (BID) | ORAL | Status: DC

## 2013-06-05 MED ORDER — FUROSEMIDE 40 MG PO TABS: 40.0000 mg | ORAL_TABLET | Freq: Every day | ORAL | Status: DC

## 2013-06-05 NOTE — Progress Notes (Signed)
Patient ID: Jesse Torres, male   DOB: 09/03/1950, 63 y.o.   MRN: [id number]   CC: follow up  HPI: patient is 63 year old male who presents to clinic for regular followup, needs refills on medications. Denies any specific concerns today, no chest pain or shortness of breath, no specific abdominal or urinary concerns. Reports doing well and compliant with medications  No Known Allergies Past Medical History  Diagnosis Date  . Hypertension   . 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  . Snoring     needs sleep study  . Pulmonary HTN   . Chronic systolic CHF (congestive heart failure)     a. Echocardiogram (02/05/2013): Mild LVH, EF 25-30%, diffuse HK, mild MR, mild LAE, mild RVE, moderately reduced RVSF, mild RAE, PASP 64, trivial effusion.;  b.  Echo (04/2013): EF 30-35%, diffuse HK, Gr 2 DD, mod LAE, mildly reduced RVSF, mild RAE, PASP 46  . Thrombocytopenia   . Ejection fraction < 50%    No current outpatient prescriptions on file prior to visit.   No current facility-administered medications on file prior to visit.   History reviewed. No pertinent family history. History   Social History  . Marital Status: Married    Spouse Name: N/A    Number of Children: N/A  . Years of Education: N/A   Occupational History  . Not on file.   Social History Main Topics  . Smoking status: Former Smoker    Types: Cigarettes    Quit date: 02/06/1995  . Smokeless tobacco: Not on file  . Alcohol Use: No  . Drug Use: No  . Sexual Activity: Not on file   Other Topics Concern  . Not on file   Social History Narrative  . No narrative on file    Review of Systems  Constitutional: Negative for fever, chills, diaphoresis, activity change, appetite change and fatigue.  HENT: Negative for ear pain, nosebleeds, congestion, facial swelling, rhinorrhea, neck pain, neck stiffness  and ear discharge.   Eyes: Negative for pain, discharge, redness, itching and visual disturbance.  Respiratory: Negative for cough, choking, chest tightness, shortness of breath, wheezing and stridor.   Cardiovascular: Negative for chest pain, palpitations and leg swelling.  Gastrointestinal: Negative for abdominal distention.  Genitourinary: Negative for dysuria, urgency, frequency, hematuria, flank pain, decreased urine volume, difficulty urinating and dyspareunia.  Musculoskeletal: Negative for back pain, joint swelling, arthralgias and gait problem.  Neurological: Negative for dizziness, tremors, seizures, syncope, facial asymmetry, speech difficulty, weakness, light-headedness, numbness and headaches.  Hematological: Negative for adenopathy. Does not bruise/bleed easily.  Psychiatric/Behavioral: Negative for hallucinations, behavioral problems, confusion, dysphoric mood, decreased concentration and agitation.    Objective:   Filed Vitals:   06/05/13 1513  BP: 171/78  Pulse: 63  Temp: 98.7 F (37.1 C)  Resp: 17    Physical Exam  Constitutional: Appears well-developed and well-nourished. No distress.  CVS: RRR, S1/S2 +, no murmurs, no gallops, no carotid bruit.  Pulmonary: Effort and breath sounds normal, no stridor, rhonchi, wheezes, rales.  Abdominal: Soft. BS +,  no distension, tenderness, rebound or guarding.  Musculoskeletal: Normal range of motion. No edema and no tenderness.    Lab Results  Component Value Date   WBC 4.6 02/25/2013   HGB 13.3 02/25/2013   HCT 40.9 02/25/2013   MCV 78.4 02/25/2013   PLT 57.0 Result may be falsely decreased due to platelet clumping.*  02/25/2013   Lab Results  Component Value Date   CREATININE 1.1 04/11/2013   BUN 22 04/11/2013   NA 140 04/11/2013   K 4.0 04/11/2013   CL 103 04/11/2013   CO2 33* 04/11/2013    Lab Results  Component Value Date   HGBA1C 5.4 03/11/2013   Lipid Panel     Component Value Date/Time   CHOL 93 05/26/2013  0733   TRIG 96.0 05/26/2013 0733   HDL 29.10* 05/26/2013 0733   CHOLHDL 3 05/26/2013 0733   VLDL 19.2 05/26/2013 0733   LDLCALC 45 05/26/2013 0733       Assessment and plan:   Patient Active Problem List   Diagnosis Date Noted  . Ejection fraction < 50% - clinically compensated, maintaining oxygen saturation the target range    . CAD (coronary artery disease) - continue aspirin, risk factor control  05/05/2013  . Dyslipidemia - recent lipid panel indicating LDL 45. Continue low-dose statin  03/11/2013  . HTN (hypertension) - provide refill on antihypertensive regimen  02/05/2013

## 2013-06-05 NOTE — Progress Notes (Signed)
Patient here for follow-up and medication refills.

## 2013-06-05 NOTE — Patient Instructions (Signed)

## 2013-06-06 ENCOUNTER — Encounter: Payer: Self-pay | Admitting: Cardiology

## 2013-06-06 ENCOUNTER — Ambulatory Visit (INDEPENDENT_AMBULATORY_CARE_PROVIDER_SITE_OTHER): Payer: Self-pay | Admitting: Cardiology

## 2013-06-06 VITALS — BP 163/83 | HR 50 | Ht 67.0 in | Wt 210.4 lb

## 2013-06-06 DIAGNOSIS — I428 Other cardiomyopathies: Secondary | ICD-10-CM

## 2013-06-06 DIAGNOSIS — I509 Heart failure, unspecified: Secondary | ICD-10-CM

## 2013-06-06 DIAGNOSIS — G4733 Obstructive sleep apnea (adult) (pediatric): Secondary | ICD-10-CM

## 2013-06-06 DIAGNOSIS — I429 Cardiomyopathy, unspecified: Secondary | ICD-10-CM

## 2013-06-06 DIAGNOSIS — I10 Essential (primary) hypertension: Secondary | ICD-10-CM

## 2013-06-06 DIAGNOSIS — I5022 Chronic systolic (congestive) heart failure: Secondary | ICD-10-CM

## 2013-06-06 DIAGNOSIS — Z9119 Patient's noncompliance with other medical treatment and regimen: Secondary | ICD-10-CM

## 2013-06-06 DIAGNOSIS — Z91199 Patient's noncompliance with other medical treatment and regimen due to unspecified reason: Secondary | ICD-10-CM

## 2013-06-06 DIAGNOSIS — I2510 Atherosclerotic heart disease of native coronary artery without angina pectoris: Secondary | ICD-10-CM

## 2013-06-06 MED ORDER — LISINOPRIL 40 MG PO TABS: 40.0000 mg | ORAL_TABLET | Freq: Two times a day (BID) | ORAL | Status: DC

## 2013-06-06 NOTE — Progress Notes (Signed)
Patient ID: Jesse Torres, male   DOB: Jan 03, 1951, 63 y.o.   MRN: [id number]    HPI  Patient is seen today to followup his cardiomyopathy. When I saw him May 07, 2013 we decided to continue to adjust his medicines. Carvedilol dose was increased. He now has a pulse rate of 55. He is tolerating it well. He is still hypertensive. I believe that hypertension plays a major role with his problems. He's feeling well.  No Known Allergies  Current Outpatient Prescriptions  Medication Sig Dispense Refill  . aspirin EC 81 MG tablet Take 1 tablet (81 mg total) by mouth daily.  30 tablet  7  . carvedilol (COREG) 12.5 MG tablet Take 1 tablet (12.5 mg total) by mouth 2 (two) times daily with a meal.  60 tablet  7  . furosemide (LASIX) 40 MG tablet Take 1 tablet (40 mg total) by mouth daily.  30 tablet  7  . lisinopril (PRINIVIL,ZESTRIL) 20 MG tablet Take 1 tablet (20 mg total) by mouth 2 (two) times daily.  60 tablet  7  . simvastatin (ZOCOR) 5 MG tablet Take 1 tablet (5 mg total) by mouth daily.  30 tablet  7   No current facility-administered medications for this visit.    History   Social History  . Marital Status: Married    Spouse Name: N/A    Number of Children: N/A  . Years of Education: N/A   Occupational History  . Not on file.   Social History Main Topics  . Smoking status: Former Smoker    Types: Cigarettes    Quit date: 02/06/1995  . Smokeless tobacco: Not on file  . Alcohol Use: No  . Drug Use: No  . Sexual Activity: Not on file   Other Topics Concern  . Not on file   Social History Narrative  . No narrative on file    No family history on file.  Past Medical History  Diagnosis Date  . Hypertension   . 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  . Snoring     needs sleep study  . Pulmonary HTN   . Chronic systolic CHF (congestive heart failure)    a. Echocardiogram (02/05/2013): Mild LVH, EF 25-30%, diffuse HK, mild MR, mild LAE, mild RVE, moderately reduced RVSF, mild RAE, PASP 64, trivial effusion.;  b.  Echo (04/2013): EF 30-35%, diffuse HK, Gr 2 DD, mod LAE, mildly reduced RVSF, mild RAE, PASP 46  . Thrombocytopenia   . Ejection fraction < 50%     History reviewed. No pertinent past surgical history.  Patient Active Problem List   Diagnosis Date Noted  . Ejection fraction < 50%   . CAD (coronary artery disease) 05/05/2013  . Dyslipidemia 03/11/2013  . Cardiomyopathy   . Chronic systolic CHF (congestive heart failure)   . Thrombocytopenia   . Snoring   . Right ventricular dysfunction 02/06/2013  . Pulmonary hypertension 02/06/2013  . RBBB (right bundle branch block) 02/06/2013  . HTN (hypertension) 02/05/2013  . Sleep apnea, obstructive 02/05/2013  . Obesity 02/05/2013  . History of noncompliance with medical treatment 02/05/2013    ROS   Patient denies fever, chills, headache, sweats, rash, change in vision, change in hearing, chest pain, cough, nausea vomiting, urinary symptoms. All other systems are reviewed and are negative.  PHYSICAL EXAM  He is stable currently. Head is atraumatic. There is no  jugulovenous distention. Lungs are clear. Respiratory effort is nonlabored. Cardiac exam reveals S1 and S2. The abdomen is soft. There may be trace peripheral edema.  Filed Vitals:   06/06/13 1510  BP: 163/83  Pulse: 50  Height: 5\' 7"  (1.702 m)  Weight: 210 lb 6.4 oz (95.437 kg)     ASSESSMENT & PLAN

## 2013-06-06 NOTE — Assessment & Plan Note (Signed)
The patient will need a sleep study at a later date. We are hoping that insurance will help with this in the future.

## 2013-06-06 NOTE — Assessment & Plan Note (Signed)
His volume status is stable. No change in therapy. 

## 2013-06-06 NOTE — Patient Instructions (Signed)
**Note De-Identified Jesse Torres Obfuscation** Your physician has recommended you make the following change in your medication: increase Lisinopril to 40 mg twice daily  Your physician recommends that you schedule a follow-up appointment in: 3 to 4 weeks

## 2013-06-06 NOTE — Assessment & Plan Note (Signed)
I am not able to push his carvedilol any higher. I will increase his lisinopril.: Likely is spironolactone. However he recently had hyperkalemia. I would not use this at this time. We will continue to adjust his meds and and a certain point repeat an echo again.

## 2013-06-06 NOTE — Assessment & Plan Note (Signed)
He appears to be more compliant now. We talked about this.

## 2013-06-06 NOTE — Assessment & Plan Note (Signed)
Blood pressure is elevated today. Lisinopril will be increased to 40 mg daily. After that I will decide about other medications for his blood pressure.

## 2013-06-09 ENCOUNTER — Ambulatory Visit: Payer: Self-pay

## 2013-07-01 ENCOUNTER — Ambulatory Visit (INDEPENDENT_AMBULATORY_CARE_PROVIDER_SITE_OTHER): Payer: Medicaid Other | Admitting: Cardiology

## 2013-07-01 ENCOUNTER — Encounter: Payer: Self-pay | Admitting: Cardiology

## 2013-07-01 VITALS — BP 128/82 | HR 56 | Ht 67.0 in | Wt 210.0 lb

## 2013-07-01 DIAGNOSIS — I428 Other cardiomyopathies: Secondary | ICD-10-CM

## 2013-07-01 DIAGNOSIS — I509 Heart failure, unspecified: Secondary | ICD-10-CM

## 2013-07-01 DIAGNOSIS — I429 Cardiomyopathy, unspecified: Secondary | ICD-10-CM

## 2013-07-01 DIAGNOSIS — I10 Essential (primary) hypertension: Secondary | ICD-10-CM

## 2013-07-01 DIAGNOSIS — G4733 Obstructive sleep apnea (adult) (pediatric): Secondary | ICD-10-CM

## 2013-07-01 DIAGNOSIS — I5022 Chronic systolic (congestive) heart failure: Secondary | ICD-10-CM

## 2013-07-01 LAB — BASIC METABOLIC PANEL
BUN: 18 mg/dL (ref 6–23)
CALCIUM: 9.4 mg/dL (ref 8.4–10.5)
CHLORIDE: 101 meq/L (ref 96–112)
CO2: 32 mEq/L (ref 19–32)
CREATININE: 1 mg/dL (ref 0.4–1.5)
GFR: 76.74 mL/min (ref >60.00)
Glucose, Bld: 85 mg/dL (ref 70–99)
Potassium: 4 mEq/L (ref 3.5–5.1)
Sodium: 139 mEq/L (ref 135–145)

## 2013-07-01 MED ORDER — LISINOPRIL 40 MG PO TABS: 40.0000 mg | ORAL_TABLET | Freq: Two times a day (BID) | ORAL | Status: DC

## 2013-07-01 MED ORDER — SIMVASTATIN 5 MG PO TABS: 5.0000 mg | ORAL_TABLET | Freq: Every day | ORAL | Status: DC

## 2013-07-01 MED ORDER — CARVEDILOL 12.5 MG PO TABS: 12.5000 mg | ORAL_TABLET | Freq: Two times a day (BID) | ORAL | Status: DC

## 2013-07-01 MED ORDER — FUROSEMIDE 40 MG PO TABS: 40.0000 mg | ORAL_TABLET | Freq: Every day | ORAL | Status: DC

## 2013-07-01 NOTE — Assessment & Plan Note (Signed)
He needs a sleep study at a later date when insurance can help him.

## 2013-07-01 NOTE — Assessment & Plan Note (Signed)
No further change in his medicines today. Previously he had some hyperkalemia. Chemistry will be checked today. I will decide over time if we will try to use spironolactone.

## 2013-07-01 NOTE — Patient Instructions (Signed)
**Note De-Identified Deborah Dondero Obfuscation** Your physician recommends that you continue on your current medications as directed. Please refer to the Current Medication list given to you today.  Your physician recommends that you return for lab work in: today  Your physician recommends that you schedule a follow-up appointment in: 8 weeks

## 2013-07-01 NOTE — Assessment & Plan Note (Signed)
His volume status remained stable. We had a good discussion about limiting his salt and fluid intake. He is actually being compliant.

## 2013-07-01 NOTE — Progress Notes (Signed)
Patient ID: Jesse Torres, male   DOB: 1950/07/08, 63 y.o.   MRN: [id number]    HPI  Patient is seen back today to followup cardiomyopathy. He continues to feel well. At his last visit I increased his ACE inhibitor. He is tolerating this. His blood pressure is better. His weight is stable.  No Known Allergies  Current Outpatient Prescriptions  Medication Sig Dispense Refill  . aspirin EC 81 MG tablet Take 1 tablet (81 mg total) by mouth daily.  30 tablet  7  . carvedilol (COREG) 12.5 MG tablet Take 1 tablet (12.5 mg total) by mouth 2 (two) times daily with a meal.  60 tablet  7  . furosemide (LASIX) 40 MG tablet Take 1 tablet (40 mg total) by mouth daily.  30 tablet  7  . lisinopril (PRINIVIL,ZESTRIL) 40 MG tablet Take 1 tablet (40 mg total) by mouth 2 (two) times daily.  60 tablet  7  . simvastatin (ZOCOR) 5 MG tablet Take 1 tablet (5 mg total) by mouth daily.  30 tablet  7   No current facility-administered medications for this visit.    History   Social History  . Marital Status: Married    Spouse Name: N/A    Number of Children: N/A  . Years of Education: N/A   Occupational History  . Not on file.   Social History Main Topics  . Smoking status: Former Smoker    Types: Cigarettes    Quit date: 02/06/1995  . Smokeless tobacco: Not on file  . Alcohol Use: No  . Drug Use: No  . Sexual Activity: Not on file   Other Topics Concern  . Not on file   Social History Narrative  . No narrative on file    No family history on file.  Past Medical History  Diagnosis Date  . Hypertension   . 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  . Snoring     needs sleep study  . Pulmonary HTN   . Chronic systolic CHF (congestive heart failure)     a. Echocardiogram (02/05/2013): Mild LVH, EF 25-30%, diffuse HK, mild MR, mild LAE, mild RVE, moderately reduced RVSF, mild  RAE, PASP 64, trivial effusion.;  b.  Echo (04/2013): EF 30-35%, diffuse HK, Gr 2 DD, mod LAE, mildly reduced RVSF, mild RAE, PASP 46  . Thrombocytopenia   . Ejection fraction < 50%     History reviewed. No pertinent past surgical history.  Patient Active Problem List   Diagnosis Date Noted  . Ejection fraction < 50%   . CAD (coronary artery disease) 05/05/2013  . Dyslipidemia 03/11/2013  . Cardiomyopathy   . Chronic systolic CHF (congestive heart failure)   . Thrombocytopenia   . Snoring   . Right ventricular dysfunction 02/06/2013  . Pulmonary hypertension 02/06/2013  . RBBB (right bundle branch block) 02/06/2013  . HTN (hypertension) 02/05/2013  . Sleep apnea, obstructive 02/05/2013  . Obesity 02/05/2013  . History of noncompliance with medical treatment 02/05/2013    ROS   Patient denies fever, chills, headache, sweats, rash, change in vision, change in hearing, chest pain, cough, nausea or vomiting, urinary symptoms. All other systems are reviewed and are negative.  PHYSICAL EXAM  Patient is oriented to person time and place. Affect is normal. Conjunctivae and sclera are normal. Head is atraumatic. Lungs are clear. Respiratory effort is nonlabored. There is no jugulovenous  distention. Cardiac exam reveals S1-S2. There no clicks or significant murmurs. Abdomen is soft. There is no peripheral edema.  Filed Vitals:   07/01/13 0947  BP: 128/82  Pulse: 56  Height: 5\' 7"  (1.702 m)  Weight: 210 lb (95.255 kg)    ASSESSMENT & PLAN

## 2013-07-01 NOTE — Assessment & Plan Note (Signed)
Blood pressure is under better control on his current medications. No change in therapy.

## 2013-09-11 ENCOUNTER — Encounter: Payer: Self-pay | Admitting: Cardiology

## 2013-09-11 ENCOUNTER — Ambulatory Visit (INDEPENDENT_AMBULATORY_CARE_PROVIDER_SITE_OTHER): Payer: Self-pay | Admitting: Cardiology

## 2013-09-11 VITALS — BP 124/74 | HR 50 | Ht 67.0 in | Wt 212.0 lb

## 2013-09-11 DIAGNOSIS — I10 Essential (primary) hypertension: Secondary | ICD-10-CM

## 2013-09-11 DIAGNOSIS — I428 Other cardiomyopathies: Secondary | ICD-10-CM

## 2013-09-11 DIAGNOSIS — I519 Heart disease, unspecified: Secondary | ICD-10-CM

## 2013-09-11 DIAGNOSIS — I158 Other secondary hypertension: Secondary | ICD-10-CM

## 2013-09-11 DIAGNOSIS — I509 Heart failure, unspecified: Secondary | ICD-10-CM

## 2013-09-11 DIAGNOSIS — I159 Secondary hypertension, unspecified: Secondary | ICD-10-CM

## 2013-09-11 DIAGNOSIS — I429 Cardiomyopathy, unspecified: Secondary | ICD-10-CM

## 2013-09-11 DIAGNOSIS — I5022 Chronic systolic (congestive) heart failure: Secondary | ICD-10-CM

## 2013-09-11 MED ORDER — FUROSEMIDE 40 MG PO TABS: 40.0000 mg | ORAL_TABLET | Freq: Every day | ORAL | Status: DC

## 2013-09-11 MED ORDER — CARVEDILOL 12.5 MG PO TABS: 12.5000 mg | ORAL_TABLET | Freq: Two times a day (BID) | ORAL | Status: DC

## 2013-09-11 MED ORDER — SIMVASTATIN 5 MG PO TABS: 5.0000 mg | ORAL_TABLET | Freq: Every day | ORAL | Status: DC

## 2013-09-11 MED ORDER — LISINOPRIL 40 MG PO TABS: 40.0000 mg | ORAL_TABLET | Freq: Two times a day (BID) | ORAL | Status: DC

## 2013-09-11 NOTE — Progress Notes (Signed)
Patient ID: Jesse Torres, male   DOB: December 16, 1950, 63 y.o.   MRN: [id number]    HPI  Patient is seen today to followup cardiomyopathy. I saw him last Jul 01, 2013. His labs at that time were stable. BUN was 18 with a creatinine 1.0 and potassium 4.0. The patient has a cardiomyopathy. He may have coronary disease. I have felt that his cardiomyopathy is mostly hypertensive. He has done very well since he adjusted his salt and fluid intake and started taking his medications very regularly. His blood pressure continues to be under good control. He had a second echo in March, 2015. It showed some improvement in LV function but he still had significant left ventricular dysfunction. He is on a good dose of beta-blockade and ACE inhibition. I have been hesitant to use spironolactone as he has had some hyperkalemia over time. His most recent potassium was 4.0.  No Known Allergies  Current Outpatient Prescriptions  Medication Sig Dispense Refill  . aspirin EC 81 MG tablet Take 1 tablet (81 mg total) by mouth daily.  30 tablet  7  . carvedilol (COREG) 12.5 MG tablet Take 1 tablet (12.5 mg total) by mouth 2 (two) times daily with a meal.  60 tablet  3  . furosemide (LASIX) 40 MG tablet Take 1 tablet (40 mg total) by mouth daily.  30 tablet  3  . lisinopril (PRINIVIL,ZESTRIL) 40 MG tablet Take 1 tablet (40 mg total) by mouth 2 (two) times daily.  60 tablet  3  . simvastatin (ZOCOR) 5 MG tablet Take 1 tablet (5 mg total) by mouth daily.  30 tablet  3   No current facility-administered medications for this visit.    History   Social History  . Marital Status: Married    Spouse Name: N/A    Number of Children: N/A  . Years of Education: N/A   Occupational History  . Not on file.   Social History Main Topics  . Smoking status: Former Smoker    Types: Cigarettes    Quit date: 02/06/1995  . Smokeless tobacco: Not on file  . Alcohol Use: No  . Drug Use: No  . Sexual Activity: Not on file   Other  Topics Concern  . Not on file   Social History Narrative  . No narrative on file    No family history on file.  Past Medical History  Diagnosis Date  . Hypertension   . 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  . Snoring     needs sleep study  . Pulmonary HTN   . Chronic systolic CHF (congestive heart failure)     a. Echocardiogram (02/05/2013): Mild LVH, EF 25-30%, diffuse HK, mild MR, mild LAE, mild RVE, moderately reduced RVSF, mild RAE, PASP 64, trivial effusion.;  b.  Echo (04/2013): EF 30-35%, diffuse HK, Gr 2 DD, mod LAE, mildly reduced RVSF, mild RAE, PASP 46  . Thrombocytopenia   . Ejection fraction < 50%     History reviewed. No pertinent past surgical history.  Patient Active Problem List   Diagnosis Date Noted  . Ejection fraction < 50%   . CAD (coronary artery disease) 05/05/2013  . Dyslipidemia 03/11/2013  . Cardiomyopathy   . Chronic systolic CHF (congestive heart failure)   . Thrombocytopenia   . Snoring   . Right ventricular dysfunction 02/06/2013  . Pulmonary hypertension 02/06/2013  .  RBBB (right bundle branch block) 02/06/2013  . HTN (hypertension) 02/05/2013  . Sleep apnea, obstructive 02/05/2013  . Obesity 02/05/2013  . History of noncompliance with medical treatment 02/05/2013    ROS   Patient denies fever, chills, headache, sweats, rash, change in vision, change in hearing, chest pain, cough, nausea or vomiting, urinary symptoms. All other systems are reviewed and are negative.   PHYSICAL EXAM  Patient is stable. He is oriented to person time and place. Affect is normal. There is no jugulovenous distention. Lungs are clear. Respiratory effort is nonlabored. Head is atraumatic. Sclera and conjunctiva are normal. His skin is tanned. Cardiac exam reveals S1 and S2. The abdomen is soft. There is no peripheral edema. There no  musculoskeletal deformities. There are no skin rashes.  Filed Vitals:   09/11/13 1551  BP: 124/74  Pulse: 50  Height: 5\' 7"  (1.702 m)  Weight: 212 lb (96.163 kg)  SpO2: 97%     ASSESSMENT & PLAN

## 2013-09-11 NOTE — Assessment & Plan Note (Signed)
I am continuing to treat him with medications for good blood pressure control and treatment of his cardiomyopathy. We'll see him back in 3 months and consider followup echo then.

## 2013-09-11 NOTE — Patient Instructions (Signed)
Your physician recommends that you continue on your current medications as directed. Please refer to the Current Medication list given to you today.  Your physician recommends that you schedule a follow-up appointment in: 3 months  

## 2013-09-11 NOTE — Assessment & Plan Note (Signed)
Blood pressure is under excellent control. No change in therapy. 

## 2013-09-11 NOTE — Assessment & Plan Note (Signed)
There is question of coronary disease. His nuclear scan raised the question of a small old apical infarct. There was no ischemia in December, 2014. Catheterization has not been done.

## 2013-09-11 NOTE — Assessment & Plan Note (Signed)
I am hoping that his RV function will improve over time.

## 2013-09-11 NOTE — Assessment & Plan Note (Signed)
His volume status is very stable. No change in therapy.

## 2013-11-06 ENCOUNTER — Other Ambulatory Visit: Payer: Self-pay | Admitting: Cardiology

## 2013-12-04 ENCOUNTER — Other Ambulatory Visit: Payer: Self-pay

## 2013-12-04 MED ORDER — SIMVASTATIN 5 MG PO TABS: 5.0000 mg | ORAL_TABLET | Freq: Every day | ORAL | Status: DC

## 2013-12-04 MED ORDER — FUROSEMIDE 40 MG PO TABS: ORAL_TABLET | ORAL | Status: DC

## 2013-12-16 ENCOUNTER — Ambulatory Visit: Payer: Self-pay | Admitting: Cardiology

## 2013-12-17 ENCOUNTER — Encounter: Payer: Self-pay | Admitting: Cardiology

## 2013-12-17 ENCOUNTER — Ambulatory Visit (INDEPENDENT_AMBULATORY_CARE_PROVIDER_SITE_OTHER): Payer: Self-pay | Admitting: Cardiology

## 2013-12-17 VITALS — BP 172/84 | HR 66 | Ht 67.0 in | Wt 218.0 lb

## 2013-12-17 DIAGNOSIS — I429 Cardiomyopathy, unspecified: Secondary | ICD-10-CM

## 2013-12-17 DIAGNOSIS — I10 Essential (primary) hypertension: Secondary | ICD-10-CM

## 2013-12-17 DIAGNOSIS — I5022 Chronic systolic (congestive) heart failure: Secondary | ICD-10-CM

## 2013-12-17 MED ORDER — SIMVASTATIN 5 MG PO TABS: 5.0000 mg | ORAL_TABLET | Freq: Every day | ORAL | Status: DC

## 2013-12-17 MED ORDER — LISINOPRIL 40 MG PO TABS: ORAL_TABLET | ORAL | Status: DC

## 2013-12-17 MED ORDER — FUROSEMIDE 40 MG PO TABS: ORAL_TABLET | ORAL | Status: DC

## 2013-12-17 MED ORDER — CARVEDILOL 12.5 MG PO TABS: ORAL_TABLET | ORAL | Status: DC

## 2013-12-17 NOTE — Assessment & Plan Note (Signed)
I'm continuing to adjust his meds as needed. At some point we will need to do follow-up echo and consider whether a device  Is to be considered.

## 2013-12-17 NOTE — Progress Notes (Signed)
Patient ID: Jesse Torres, male   DOB: 1950/08/20, 63 y.o.   MRN: [id number]    HPI  patient is seen today to follow-up his cardiomyopathy. He actually is feeling quite well. He is reliable with his medicines. However he takes them at breakfast and he is not yet eaten. His systolic pressure is elevated today. It was not elevated in the office at the time of his last visit. He is gaining some true body weight.  No Known Allergies  Current Outpatient Prescriptions  Medication Sig Dispense Refill  . aspirin EC 81 MG tablet Take 1 tablet (81 mg total) by mouth daily. 30 tablet 7  . carvedilol (COREG) 12.5 MG tablet TAKE 1 TABLET BY MOUTH 2 TIMES DAILY WITH A MEAL. 60 tablet 3  . furosemide (LASIX) 40 MG tablet TAKE 1 TABLET BY MOUTH DAILY. 30 tablet 0  . lisinopril (PRINIVIL,ZESTRIL) 40 MG tablet TAKE 1 TABLET BY MOUTH 2 TIMES DAILY. 60 tablet 3  . simvastatin (ZOCOR) 5 MG tablet Take 1 tablet (5 mg total) by mouth daily. 30 tablet 0   No current facility-administered medications for this visit.    History   Social History  . Marital Status: Married    Spouse Name: N/A    Number of Children: N/A  . Years of Education: N/A   Occupational History  . Not on file.   Social History Main Topics  . Smoking status: Former Smoker    Types: Cigarettes    Quit date: 02/06/1995  . Smokeless tobacco: Not on file  . Alcohol Use: No  . Drug Use: No  . Sexual Activity: Not on file   Other Topics Concern  . Not on file   Social History Narrative    History reviewed. No pertinent family history.  Past Medical History  Diagnosis Date  . Hypertension   . 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  . Snoring     needs sleep study  . Pulmonary HTN   . Chronic systolic CHF (congestive heart failure)     a. Echocardiogram (02/05/2013): Mild LVH, EF 25-30%, diffuse HK, mild MR,  mild LAE, mild RVE, moderately reduced RVSF, mild RAE, PASP 64, trivial effusion.;  b.  Echo (04/2013): EF 30-35%, diffuse HK, Gr 2 DD, mod LAE, mildly reduced RVSF, mild RAE, PASP 46  . Thrombocytopenia   . Ejection fraction < 50%     History reviewed. No pertinent past surgical history.  Patient Active Problem List   Diagnosis Date Noted  . Ejection fraction < 50%   . CAD (coronary artery disease) 05/05/2013  . Dyslipidemia 03/11/2013  . Cardiomyopathy   . Chronic systolic CHF (congestive heart failure)   . Thrombocytopenia   . Snoring   . Right ventricular dysfunction 02/06/2013  . Pulmonary hypertension 02/06/2013  . RBBB (right bundle branch block) 02/06/2013  . HTN (hypertension) 02/05/2013  . Sleep apnea, obstructive 02/05/2013  . Obesity 02/05/2013  . History of noncompliance with medical treatment 02/05/2013    ROS   patient denies fever, chills, headache, sweats, rash, change in vision, change in hearing, chest pain, cough, nausea or vomiting, urinary symptoms. All other systems are reviewed and are negative.  PHYSICAL EXAM  Patient is oriented to person time and place. Affect is normal. Head is atraumatic. Sclera and conjunctiva are normal. Lungs are clear. Respiratory effort is not labored. Cardiac exam reveals an S1  and S2. Abdomen is soft. There is no peripheral edema. There are no musculoskeletal deformities.  Filed Vitals:   12/17/13 0851  BP: 172/84  Pulse: 66  Height: 5\' 7"  (1.702 m)  Weight: 218 lb (98.884 kg)     ASSESSMENT & PLAN

## 2013-12-17 NOTE — Patient Instructions (Signed)
Your physician recommends that you continue on your current medications as directed. Please refer to the Current Medication list given to you today.  Your physician recommends that you schedule a follow-up appointment in: 2 months 

## 2013-12-17 NOTE — Assessment & Plan Note (Signed)
Blood pressure is elevated today. He has not taken his meds yet. He has been reliable with his meds. I've chosen not to change his medications today. We will be sure that he has his prescriptions for long-term.

## 2013-12-17 NOTE — Assessment & Plan Note (Signed)
Volume status is stable. No change in therapy. 

## 2014-01-02 ENCOUNTER — Other Ambulatory Visit: Payer: Self-pay | Admitting: Cardiology

## 2014-02-10 ENCOUNTER — Telehealth: Payer: Self-pay | Admitting: *Deleted

## 2014-02-16 ENCOUNTER — Ambulatory Visit (INDEPENDENT_AMBULATORY_CARE_PROVIDER_SITE_OTHER): Payer: Self-pay | Admitting: Cardiology

## 2014-02-16 ENCOUNTER — Encounter: Payer: Self-pay | Admitting: Cardiology

## 2014-02-16 VITALS — BP 138/92 | HR 75 | Ht 67.0 in | Wt 218.0 lb

## 2014-02-16 DIAGNOSIS — I429 Cardiomyopathy, unspecified: Secondary | ICD-10-CM

## 2014-02-16 DIAGNOSIS — I493 Ventricular premature depolarization: Secondary | ICD-10-CM

## 2014-02-16 DIAGNOSIS — I5022 Chronic systolic (congestive) heart failure: Secondary | ICD-10-CM

## 2014-02-16 LAB — BASIC METABOLIC PANEL
BUN: 17 mg/dL (ref 6–23)
CALCIUM: 8.9 mg/dL (ref 8.4–10.5)
CO2: 30 mEq/L (ref 19–32)
Chloride: 103 mEq/L (ref 96–112)
Creatinine, Ser: 1.2 mg/dL (ref 0.4–1.5)
GFR: 64.31 mL/min (ref >60.00)
GLUCOSE: 107 mg/dL — AB (ref 70–99)
POTASSIUM: 4.2 meq/L (ref 3.5–5.1)
Sodium: 139 mEq/L (ref 135–145)

## 2014-02-16 MED ORDER — SPIRONOLACTONE 25 MG PO TABS: 12.5000 mg | ORAL_TABLET | Freq: Every day | ORAL | Status: DC

## 2014-02-16 MED ORDER — SPIRONOLACTONE 12.5 MG HALF TABLET: 12.5000 mg | ORAL_TABLET | Freq: Every day | ORAL | Status: DC

## 2014-02-16 NOTE — Assessment & Plan Note (Signed)
His volume status is stable. He is doing well. No change.

## 2014-02-16 NOTE — Assessment & Plan Note (Signed)
He does have asymptomatic PVCs. This before at this time.

## 2014-02-16 NOTE — Progress Notes (Signed)
Patient ID: Jesse Torres, male   DOB: [birth year], 64 y.o.   MRN: [id number]    HPI The patient is seen in follow-up cardiomyopathy. When I saw him last blood pressure was mildly elevated. He is not taking his morning meds. He returns today for overall follow-up and he did take his morning meds. His blood pressure is still slightly elevated. I have reviewed the records concerning the potential use of spironolactone. In the past he was noncompliant with his meds but he has done very well in the past year. At one point there was some hyperkalemia in the past. I've been hesitant to use spironolactone on this basis. However with his last office visit his potassium was normal. Potassium will be checked again today. If it remains normal he will take spironolactone 12.5 mg daily. We will then try to titrate this up to 25 mg.  No Known Allergies  Current Outpatient Prescriptions  Medication Sig Dispense Refill  . aspirin EC 81 MG tablet Take 1 tablet (81 mg total) by mouth daily. 30 tablet 7  . carvedilol (COREG) 12.5 MG tablet TAKE 1 TABLET BY MOUTH 2 TIMES DAILY WITH A MEAL. 60 tablet 11  . furosemide (LASIX) 40 MG tablet TAKE 1 TABLET BY MOUTH DAILY. 30 tablet 11  . lisinopril (PRINIVIL,ZESTRIL) 40 MG tablet TAKE 1 TABLET BY MOUTH 2 TIMES DAILY. 60 tablet 11  . simvastatin (ZOCOR) 5 MG tablet Take 1 tablet (5 mg total) by mouth daily. 30 tablet 11   No current facility-administered medications for this visit.    History   Social History  . Marital Status: Married    Spouse Name: N/A    Number of Children: N/A  . Years of Education: N/A   Occupational History  . Not on file.   Social History Main Topics  . Smoking status: Former Smoker    Types: Cigarettes    Quit date: 02/06/1995  . Smokeless tobacco: Not on file  . Alcohol Use: No  . Drug Use: No  . Sexual Activity: Not on file   Other Topics Concern  . Not on file   Social History Narrative    History reviewed. No pertinent  family history.  Past Medical History  Diagnosis Date  . Hypertension   . 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  . Snoring     needs sleep study  . Pulmonary HTN   . Chronic systolic CHF (congestive heart failure)     a. Echocardiogram (02/05/2013): Mild LVH, EF 25-30%, diffuse HK, mild MR, mild LAE, mild RVE, moderately reduced RVSF, mild RAE, PASP 64, trivial effusion.;  b.  Echo (04/2013): EF 30-35%, diffuse HK, Gr 2 DD, mod LAE, mildly reduced RVSF, mild RAE, PASP 46  . Thrombocytopenia   . Ejection fraction < 50%     Past Surgical History  Procedure Laterality Date  . None      Patient Active Problem List   Diagnosis Date Noted  . Ejection fraction < 50%   . CAD (coronary artery disease) 05/05/2013  . Dyslipidemia 03/11/2013  . Cardiomyopathy   . Chronic systolic CHF (congestive heart failure)   . Thrombocytopenia   . Snoring   . Right ventricular dysfunction 02/06/2013  . Pulmonary hypertension 02/06/2013  . RBBB (right bundle branch block) 02/06/2013  . Essential hypertension 02/05/2013  . Sleep apnea, obstructive 02/05/2013  . Obesity 02/05/2013  .  History of noncompliance with medical treatment 02/05/2013    ROS   Patient denies fever, chills, headache, sweats, rash, change in vision, change in hearing, chest pain, cough, nausea or vomiting, urinary symptoms. All other systems are reviewed and are negative.  PHYSICAL EXAM  The patient is oriented to person time and place. Affect is normal. He is overweight. He is trying to lose weight. Head is atraumatic. Sclera and conjunctiva are normal. There is no jugular venous distention. Lungs are clear. Respiratory effort is not labored. Cardiac exam reveals S1 and S2. Abdomen is soft. There is no peripheral edema.  Filed Vitals:   02/16/14 0804  BP: 138/92  Pulse: 75  Height:  (1.702 m)  Weight:  218 lb (98.884 kg)   EKG is done today and reviewed by me. He has old right bundle branch block. In addition he has PVCs.  ASSESSMENT & PLAN

## 2014-02-16 NOTE — Assessment & Plan Note (Signed)
I'm continuing to try to titrate his medications optimally. We will now check his labs and add spironolactone. We will start at 12.5 mg and see if we can titrate up to 25 mg. I will see him for follow-up. Eventually he'll need another follow-up echo and consideration of an ICD.

## 2014-02-16 NOTE — Patient Instructions (Signed)
**Note De-Identified Lashena Signer Obfuscation** Your physician has recommended you make the following change in your medication: start taking Spironolactone 12.5 mg daily  Your physician recommends that you return for lab work in: today [employer])  Your physician recommends that you schedule a follow-up appointment in: 4 weeks

## 2014-03-16 ENCOUNTER — Encounter: Payer: Self-pay | Admitting: Cardiology

## 2014-03-16 ENCOUNTER — Ambulatory Visit (INDEPENDENT_AMBULATORY_CARE_PROVIDER_SITE_OTHER): Payer: Self-pay | Admitting: Cardiology

## 2014-03-16 VITALS — BP 148/72 | HR 59 | Ht 67.0 in | Wt 217.2 lb

## 2014-03-16 DIAGNOSIS — I429 Cardiomyopathy, unspecified: Secondary | ICD-10-CM

## 2014-03-16 DIAGNOSIS — I5022 Chronic systolic (congestive) heart failure: Secondary | ICD-10-CM

## 2014-03-16 DIAGNOSIS — I2510 Atherosclerotic heart disease of native coronary artery without angina pectoris: Secondary | ICD-10-CM

## 2014-03-16 LAB — BASIC METABOLIC PANEL
BUN: 21 mg/dL (ref 6–23)
CO2: 30 mEq/L (ref 19–32)
Calcium: 9.5 mg/dL (ref 8.4–10.5)
Chloride: 104 mEq/L (ref 96–112)
Creatinine, Ser: 1.09 mg/dL (ref 0.40–1.50)
GFR: 72.52 mL/min (ref >60.00)
GLUCOSE: 108 mg/dL — AB (ref 70–99)
Potassium: 4 mEq/L (ref 3.5–5.1)
SODIUM: 139 meq/L (ref 135–145)

## 2014-03-16 NOTE — Progress Notes (Signed)
HPI Patient is seen today to follow-up cardiomyopathy. I saw him last on February 16, 2014. We checked his potassium that day and it was 4.2. BUN was 17 and creatinine 1.2. These labs were stable and we started him on spironolactone 12.5 mg daily. He has tolerated this without difficulty. He returns today for follow-up. He's not having any chest pain or shortness of breath.  No Known Allergies  Current Outpatient Prescriptions  Medication Sig Dispense Refill  . aspirin EC 81 MG tablet Take 1 tablet (81 mg total) by mouth daily. 30 tablet 7  . carvedilol (COREG) 12.5 MG tablet TAKE 1 TABLET BY MOUTH 2 TIMES DAILY WITH A MEAL. 60 tablet 11  . furosemide (LASIX) 40 MG tablet TAKE 1 TABLET BY MOUTH DAILY. 30 tablet 11  . lisinopril (PRINIVIL,ZESTRIL) 40 MG tablet TAKE 1 TABLET BY MOUTH 2 TIMES DAILY. 60 tablet 11  . naproxen sodium (ANAPROX) 220 MG tablet Take 220 mg by mouth daily as needed (pain).    . simvastatin (ZOCOR) 5 MG tablet Take 1 tablet (5 mg total) by mouth daily. 30 tablet 11  . spironolactone (ALDACTONE) 25 MG tablet Take 0.5 tablets (12.5 mg total) by mouth daily. 15 tablet 3   No current facility-administered medications for this visit.    History   Social History  . Marital Status: Married    Spouse Name: N/A    Number of Children: N/A  . Years of Education: N/A   Occupational History  . Not on file.   Social History Main Topics  . Smoking status: Former Smoker    Types: Cigarettes    Quit date: 02/06/1995  . Smokeless tobacco: Not on file  . Alcohol Use: No  . Drug Use: No  . Sexual Activity: Not on file   Other Topics Concern  . Not on file   Social History Narrative    Family history Patient denies any strong family history of coronary disease.  Past Medical History  Diagnosis Date  . Hypertension   . Cardiomyopathy     probable HTN cardiomyopathy;  Lexiscan Myoview (02/07/2013): EF 34%, old small apical infarct, no ischemia, moderate apical  hypokinesis.  . Snoring     needs sleep study  . Pulmonary HTN   . Chronic systolic CHF (congestive heart failure)     a. Echocardiogram (02/05/2013): Mild LVH, EF 25-30%, diffuse HK, mild MR, mild LAE, mild RVE, moderately reduced RVSF, mild RAE, PASP 64, trivial effusion.;  b.  Echo (04/2013): EF 30-35%, diffuse HK, Gr 2 DD, mod LAE, mildly reduced RVSF, mild RAE, PASP 46  . Thrombocytopenia   . Ejection fraction < 50%     Past Surgical History  Procedure Laterality Date  . None      Patient Active Problem List   Diagnosis Date Noted  . PVC's (premature ventricular contractions) 02/16/2014  . Ejection fraction < 50%   . CAD (coronary artery disease) 05/05/2013  . Dyslipidemia 03/11/2013  . Cardiomyopathy   . Chronic systolic CHF (congestive heart failure)   . Thrombocytopenia   . Snoring   . Right ventricular dysfunction 02/06/2013  . Pulmonary hypertension 02/06/2013  . RBBB (right bundle branch block) 02/06/2013  . Essential hypertension 02/05/2013  . Sleep apnea, obstructive 02/05/2013  . Obesity 02/05/2013  . History of noncompliance with medical treatment 02/05/2013    ROS  Patient denies fever, chills, headache, sweats, rash, change in vision, change in hearing, chest pain, cough, nausea or vomiting, urinary symptoms.  All other systems are reviewed and are negative.  PHYSICAL EXAM Patient looks good today. He is overweight. He is oriented to person time and place. Affect is normal. Head is atraumatic. Sclera and conjunctiva are normal. There is no jugulovenous distention. Lungs are clear. Respiratory effort is not labored. Cardiac exam reveals S1 and S2. Abdomen is soft. There is no peripheral edema.  Filed Vitals:   03/16/14 0757  BP: 148/72  Pulse: 59  Height: 5\' 7"  (1.702 m)  Weight: 217 lb 3.2 oz (98.521 kg)  SpO2: 98%   EKG is not done today.  ASSESSMENT & PLAN

## 2014-03-16 NOTE — Assessment & Plan Note (Signed)
We started spironolactone on January 4. We will check chemistry again today. If his potassium and renal function are stable the dose will be increased to 25 mg daily. I will then see him back in the range of 6 weeks. If he is stable at that time we can start to consider his follow-up echo.

## 2014-03-16 NOTE — Patient Instructions (Signed)
**Note De-identified Jesse Torres Obfuscation** Your physician recommends that you continue on your current medications as directed. Please refer to the Current Medication list given to you today.  Your physician recommends that you return for lab work in: today (BMET)   Your physician recommends that you schedule a follow-up appointment in: 6 weeks    

## 2014-03-16 NOTE — Assessment & Plan Note (Signed)
Volume status is quite stable.  No change in therapy. 

## 2014-03-16 NOTE — Assessment & Plan Note (Signed)
Coronary disease is stable. No change in therapy. 

## 2014-03-17 ENCOUNTER — Telehealth: Payer: Self-pay | Admitting: Cardiology

## 2014-03-17 MED ORDER — SPIRONOLACTONE 25 MG PO TABS: 25.0000 mg | ORAL_TABLET | Freq: Every day | ORAL | Status: DC

## 2014-03-17 NOTE — Telephone Encounter (Signed)
New Message  Pt was returning Lynn's call to determine if he should continue with a half tablet of medication or whole pill after recent lab results. Please call back and discuss.

## 2014-03-17 NOTE — Telephone Encounter (Signed)
Patient notified that Dr. Myrtis Ser would like for him to increase the spironlactone to a full dose (25 mg daily). Patient verbalized agreement with current treatment plan and change to medication dosage.

## 2014-05-06 ENCOUNTER — Ambulatory Visit (INDEPENDENT_AMBULATORY_CARE_PROVIDER_SITE_OTHER): Payer: Self-pay | Admitting: Cardiology

## 2014-05-06 ENCOUNTER — Encounter: Payer: Self-pay | Admitting: Cardiology

## 2014-05-06 VITALS — BP 130/80 | HR 52 | Ht 67.0 in | Wt 215.0 lb

## 2014-05-06 DIAGNOSIS — I5022 Chronic systolic (congestive) heart failure: Secondary | ICD-10-CM

## 2014-05-06 DIAGNOSIS — I10 Essential (primary) hypertension: Secondary | ICD-10-CM

## 2014-05-06 DIAGNOSIS — I5189 Other ill-defined heart diseases: Secondary | ICD-10-CM

## 2014-05-06 DIAGNOSIS — I429 Cardiomyopathy, unspecified: Secondary | ICD-10-CM

## 2014-05-06 DIAGNOSIS — I519 Heart disease, unspecified: Secondary | ICD-10-CM

## 2014-05-06 LAB — BASIC METABOLIC PANEL
BUN: 20 mg/dL (ref 6–23)
CO2: 31 meq/L (ref 19–32)
Calcium: 9.5 mg/dL (ref 8.4–10.5)
Chloride: 102 mEq/L (ref 96–112)
Creatinine, Ser: 1.15 mg/dL (ref 0.40–1.50)
GFR: 68.15 mL/min (ref >60.00)
Glucose, Bld: 98 mg/dL (ref 70–99)
Potassium: 3.9 mEq/L (ref 3.5–5.1)
SODIUM: 137 meq/L (ref 135–145)

## 2014-05-06 NOTE — Patient Instructions (Signed)
Your physician recommends that you continue on your current medications as directed. Please refer to the Current Medication list given to you today.  Your physician recommends that you return for lab work in: today [employer])  Your physician has requested that you have an echocardiogram. Echocardiography is a painless test that uses sound waves to create images of your heart. It provides your doctor with information about the size and shape of your heart and how well your heart's chambers and valves are working. This procedure takes approximately one hour. There are no restrictions for this procedure.  Your physician recommends that you schedule a follow-up appointment in: 3 months.

## 2014-05-06 NOTE — Assessment & Plan Note (Signed)
Patient has done very well. His meds have been adjusted carefully over time. No change in his meds today. His volume status is stable.

## 2014-05-06 NOTE — Assessment & Plan Note (Signed)
The patient has not undergone catheterization. It is now time to see if his LV function has improved. 2-D echo will be scheduled.

## 2014-05-06 NOTE — Progress Notes (Signed)
Cardiology Office Note   Date:  05/06/2014   ID:  Jesse Torres, DOB [birth year], MRN [id number]  PCP:  Jeanann Lewandowsky, MD  Cardiologist:  Willa Rough, MD   Chief Complaint  Patient presents with  . Appointment    Follow-up cardiomyopathy      History of Present Illness: Jesse Torres is a 64 y.o. male who presents today to follow-up cardiomyopathy. Initially his ejection fraction was found to be in the 35% range. He had no ischemia by nuclear study at that time. He has not undergone cardiac catheterization. Over time he has been very compliant with his medicines. We have carefully titrated up all of his medications and he is now on full dose meds. Most recently we increased his spironolactone to full dose. He needs follow-up potassium checked today relative to this. He's not having any chest pain or shortness of breath.    Past Medical History  Diagnosis Date  . Hypertension   . 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  . Snoring     needs sleep study  . Pulmonary HTN   . Chronic systolic CHF (congestive heart failure)     a. Echocardiogram (02/05/2013): Mild LVH, EF 25-30%, diffuse HK, mild MR, mild LAE, mild RVE, moderately reduced RVSF, mild RAE, PASP 64, trivial effusion.;  b.  Echo (04/2013): EF 30-35%, diffuse HK, Gr 2 DD, mod LAE, mildly reduced RVSF, mild RAE, PASP 46  . Thrombocytopenia   . Ejection fraction < 50%     Past Surgical History  Procedure Laterality Date  . None      Patient Active Problem List   Diagnosis Date Noted  . PVC's (premature ventricular contractions) 02/16/2014  . Ejection fraction < 50%   . CAD (coronary artery disease) 05/05/2013  . Dyslipidemia 03/11/2013  . Cardiomyopathy   . Chronic systolic CHF (congestive heart failure)   . Thrombocytopenia   . Snoring   . Right ventricular dysfunction 02/06/2013  .  Pulmonary hypertension 02/06/2013  . RBBB (right bundle branch block) 02/06/2013  . Essential hypertension 02/05/2013  . Sleep apnea, obstructive 02/05/2013  . Obesity 02/05/2013  . History of noncompliance with medical treatment 02/05/2013      Current Outpatient Prescriptions  Medication Sig Dispense Refill  . aspirin EC 81 MG tablet Take 1 tablet (81 mg total) by mouth daily. 30 tablet 7  . carvedilol (COREG) 12.5 MG tablet TAKE 1 TABLET BY MOUTH 2 TIMES DAILY WITH A MEAL. 60 tablet 11  . furosemide (LASIX) 40 MG tablet TAKE 1 TABLET BY MOUTH DAILY. 30 tablet 11  . lisinopril (PRINIVIL,ZESTRIL) 40 MG tablet TAKE 1 TABLET BY MOUTH 2 TIMES DAILY. 60 tablet 11  . simvastatin (ZOCOR) 5 MG tablet Take 1 tablet (5 mg total) by mouth daily. 30 tablet 11  . spironolactone (ALDACTONE) 25 MG tablet Take 1 tablet (25 mg total) by mouth daily. 90 tablet 3   No current facility-administered medications for this visit.    Allergies:   Review of patient's allergies indicates no known allergies.    Social History:  The patient  reports that he quit smoking about 19 years ago. His smoking use included Cigarettes. He does not have any smokeless tobacco history on file. He reports that he does not drink alcohol or use illicit drugs.   Family History:  The patient's family history includes Heart attack in his mother;  Stroke in his father.    ROS:  Please see the history of present illness.     Patient denies fever, chills, headache, sweats, rash, change in vision, change in hearing, chest pain, cough, nausea or vomiting, urinary symptoms. All other systems are reviewed and are negative.   PHYSICAL EXAM: VS:  BP 130/80 mmHg  Pulse 52  Ht  (1.702 m)  Wt 215 lb (97.523 kg)  BMI 33.67 kg/m2 , Patient is stable today. Head is atraumatic. Sclera and conjunctiva are normal. There is no jugular venous distention. Lungs are clear. Respiratory effort is nonlabored. Cardiac exam reveals an S1 and S2.  Abdomen is soft. There is no peripheral edema. There are no musculoskeletal deformities. There are no skin rashes. Neurologic is grossly intact.  EKG:   EKG is not done today.   Recent Labs: 05/26/2013: ALT 15 03/16/2014: BUN 21; Creatinine 1.09; Potassium 4.0; Sodium 139    Lipid Panel    Component Value Date/Time   CHOL 93 05/26/2013 0733   TRIG 96.0 05/26/2013 0733   HDL 29.10* 05/26/2013 0733   CHOLHDL 3 05/26/2013 0733   VLDL 19.2 05/26/2013 0733   LDLCALC 45 05/26/2013 0733      Wt Readings from Last 3 Encounters:  05/06/14 215 lb (97.523 kg)  03/16/14 217 lb 3.2 oz (98.521 kg)  02/16/14 218 lb (98.884 kg)      Current medicines are reviewed  The patient understands his meds.     ASSESSMENT AND PLAN:

## 2014-05-06 NOTE — Assessment & Plan Note (Signed)
We will learn more about his right ventricular function and PA pressures with his echo to be planned.

## 2014-05-06 NOTE — Assessment & Plan Note (Signed)
Blood pressures controlled. No change in therapy. 

## 2014-05-13 ENCOUNTER — Ambulatory Visit (HOSPITAL_COMMUNITY): Payer: Self-pay | Attending: Cardiovascular Disease | Admitting: Cardiology

## 2014-05-13 DIAGNOSIS — I5189 Other ill-defined heart diseases: Secondary | ICD-10-CM | POA: Insufficient documentation

## 2014-05-13 DIAGNOSIS — I429 Cardiomyopathy, unspecified: Secondary | ICD-10-CM | POA: Insufficient documentation

## 2014-05-13 DIAGNOSIS — I519 Heart disease, unspecified: Secondary | ICD-10-CM

## 2014-05-13 NOTE — Progress Notes (Signed)
Echo performed. 

## 2014-07-20 ENCOUNTER — Encounter: Payer: Self-pay | Admitting: Cardiology

## 2014-07-20 ENCOUNTER — Ambulatory Visit (INDEPENDENT_AMBULATORY_CARE_PROVIDER_SITE_OTHER): Payer: Self-pay | Admitting: Cardiology

## 2014-07-20 VITALS — BP 126/82 | HR 60 | Ht 67.0 in | Wt 215.0 lb

## 2014-07-20 DIAGNOSIS — I10 Essential (primary) hypertension: Secondary | ICD-10-CM

## 2014-07-20 DIAGNOSIS — I5022 Chronic systolic (congestive) heart failure: Secondary | ICD-10-CM

## 2014-07-20 DIAGNOSIS — I429 Cardiomyopathy, unspecified: Secondary | ICD-10-CM

## 2014-07-20 MED ORDER — SPIRONOLACTONE 25 MG PO TABS: 25.0000 mg | ORAL_TABLET | Freq: Every day | ORAL | Status: DC

## 2014-07-20 MED ORDER — LISINOPRIL 40 MG PO TABS: ORAL_TABLET | ORAL | Status: DC

## 2014-07-20 MED ORDER — SIMVASTATIN 5 MG PO TABS: 5.0000 mg | ORAL_TABLET | Freq: Every day | ORAL | Status: DC

## 2014-07-20 MED ORDER — CARVEDILOL 12.5 MG PO TABS: ORAL_TABLET | ORAL | Status: DC

## 2014-07-20 MED ORDER — FUROSEMIDE 40 MG PO TABS: ORAL_TABLET | ORAL | Status: DC

## 2014-07-20 NOTE — Assessment & Plan Note (Signed)
Falla status is quite stable. Ejection fraction normalized. Continue the same medications.

## 2014-07-20 NOTE — Patient Instructions (Addendum)
Medication Instructions:  Same-no change  Labwork: None  Testing/Procedures: None  Follow-Up: Your physician wants you to follow-up in: 6 months. You will receive a reminder letter in the mail two months in advance. If you don't receive a letter, please call our office to schedule the follow-up appointment.      

## 2014-07-20 NOTE — Assessment & Plan Note (Signed)
It is most likely that his cardiomyopathy was hypertensive in origin. Catheterization has never been done. His EF normalized at 60% by echo in March, 2016. He is very reliable about taking his medications. His volume status is stable. No further workup.

## 2014-07-20 NOTE — Progress Notes (Signed)
Cardiology Office Note   Date:  07/20/2014   ID:  Jesse Torres, DOB 1950-11-16, MRN [id number]  PCP:  Jeanann Lewandowsky, MD  Cardiologist:  Willa Rough, MD   Chief Complaint  Patient presents with  . Appointment    Follow-up cardiomyopathy      History of Present Illness: Jesse Torres is a 64 y.o. male who presents today to follow-up his cardiomyopathy. Initially his ejection fraction had been noted at 35%. There was no ischemia by nuclear scan. Cardiac catheterization was never done. He is been very compliant with his medications and has had normalization of left ventricular function. He's doing very well. I saw him last March, 2016. The plan at that time was to repeat his echo to see if his LV had normalized. The echo looked great. His EF was 60-65%. There were no significant valvular abnormalities.  Past Medical History  Diagnosis Date  . Hypertension   . 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  . Snoring     needs sleep study  . Pulmonary HTN   . Chronic systolic CHF (congestive heart failure)     a. Echocardiogram (02/05/2013): Mild LVH, EF 25-30%, diffuse HK, mild MR, mild LAE, mild RVE, moderately reduced RVSF, mild RAE, PASP 64, trivial effusion.;  b.  Echo (04/2013): EF 30-35%, diffuse HK, Gr 2 DD, mod LAE, mildly reduced RVSF, mild RAE, PASP 46  . Thrombocytopenia   . Ejection fraction < 50%     Past Surgical History  Procedure Laterality Date  . None      Patient Active Problem List   Diagnosis Date Noted  . PVC's (premature ventricular contractions) 02/16/2014  . Ejection fraction < 50%   . CAD (coronary artery disease) 05/05/2013  . Dyslipidemia 03/11/2013  . Cardiomyopathy   . Chronic systolic CHF (congestive heart failure)   . Thrombocytopenia   . Snoring   . Right ventricular dysfunction 02/06/2013  . Pulmonary hypertension  02/06/2013  . RBBB (right bundle branch block) 02/06/2013  . Essential hypertension 02/05/2013  . Sleep apnea, obstructive 02/05/2013  . Obesity 02/05/2013  . History of noncompliance with medical treatment 02/05/2013      Current Outpatient Prescriptions  Medication Sig Dispense Refill  . aspirin EC 81 MG tablet Take 1 tablet (81 mg total) by mouth daily. 30 tablet 7  . carvedilol (COREG) 12.5 MG tablet TAKE 1 TABLET BY MOUTH 2 TIMES DAILY WITH A MEAL. 180 tablet 3  . furosemide (LASIX) 40 MG tablet TAKE 1 TABLET BY MOUTH DAILY. 90 tablet 3  . lisinopril (PRINIVIL,ZESTRIL) 40 MG tablet TAKE 1 TABLET BY MOUTH 2 TIMES DAILY. 180 tablet 3  . simvastatin (ZOCOR) 5 MG tablet Take 1 tablet (5 mg total) by mouth daily. 90 tablet 3  . spironolactone (ALDACTONE) 25 MG tablet Take 1 tablet (25 mg total) by mouth daily. 90 tablet 3   No current facility-administered medications for this visit.    Allergies:   Review of patient's allergies indicates no known allergies.    Social History:  The patient  reports that he quit smoking about 19 years ago. His smoking use included Cigarettes. He does not have any smokeless tobacco history on file. He reports that he does not drink alcohol or use illicit drugs.   Family History:  The patient's family history includes Heart attack in his mother; Stroke in his father.  ROS:  Please see the history of present illness.    Patient denies fever, chills, headache, sweats, rash, change in vision, change in hearing, chest pain, cough, nausea or vomiting, urinary symptoms. All other systems are reviewed and are negative.   PHYSICAL EXAM: VS:  BP 126/82 mmHg  Pulse 60  Ht  (1.702 m)  Wt 215 lb (97.523 kg)  BMI 33.67 kg/m2  SpO2 97% , Patient looks really good. He is oriented to person time and place. Affect is normal. Head is atraumatic. Sclera and conjunctiva are normal. There is no jugular venous distention. Lungs are clear. Respiratory effort is  not labored. Cardiac exam reveals an S1 and S2. The abdomen is soft. There is no peripheral edema. There are no musculoskeletal deformities. There are no skin rashes.  EKG:   EKG is not done today.   Recent Labs: 05/06/2014: BUN 20; Creatinine 1.15; Potassium 3.9; Sodium 137    Lipid Panel    Component Value Date/Time   CHOL 93 05/26/2013 0733   TRIG 96.0 05/26/2013 0733   HDL 29.10* 05/26/2013 0733   CHOLHDL 3 05/26/2013 0733   VLDL 19.2 05/26/2013 0733   LDLCALC 45 05/26/2013 0733      Wt Readings from Last 3 Encounters:  07/20/14 215 lb (97.523 kg)  05/06/14 215 lb (97.523 kg)  03/16/14 217 lb 3.2 oz (98.521 kg)      Current medicines are reviewed       ASSESSMENT AND PLAN:

## 2014-07-20 NOTE — Assessment & Plan Note (Signed)
Blood pressures controlled. No change in therapy. 

## 2014-09-10 NOTE — Telephone Encounter (Signed)
Erroneous encounter

## 2014-12-28 ENCOUNTER — Telehealth: Payer: Self-pay | Admitting: Cardiology

## 2014-12-28 MED ORDER — FUROSEMIDE 40 MG PO TABS: ORAL_TABLET | ORAL | Status: DC

## 2014-12-28 NOTE — Telephone Encounter (Signed)
Pt's medication has been sent to pt's pharmacy. Confirmation received.

## 2014-12-28 NOTE — Telephone Encounter (Signed)
New message   *STAT* If patient is at the pharmacy, call can be transferred to refill team.   1. Which medications need to be refilled? (please list name of each medication and dose if known) furosemide 40 mg  2. Which pharmacy/location (including street and city if local pharmacy) is medication to be sent to? [facility] ([address])  3. Do they need a 30 day or 90 day supply? 30 day

## 2015-01-04 ENCOUNTER — Encounter: Payer: Self-pay | Admitting: Family Medicine

## 2015-01-04 ENCOUNTER — Ambulatory Visit: Payer: Self-pay | Attending: Family Medicine | Admitting: Family Medicine

## 2015-01-04 VITALS — BP 138/84 | HR 74 | Temp 97.7°F | Resp 16 | Ht 67.0 in | Wt 216.0 lb

## 2015-01-04 DIAGNOSIS — Z87891 Personal history of nicotine dependence: Secondary | ICD-10-CM | POA: Insufficient documentation

## 2015-01-04 DIAGNOSIS — Z7982 Long term (current) use of aspirin: Secondary | ICD-10-CM | POA: Insufficient documentation

## 2015-01-04 DIAGNOSIS — Z0000 Encounter for general adult medical examination without abnormal findings: Secondary | ICD-10-CM

## 2015-01-04 DIAGNOSIS — B029 Zoster without complications: Secondary | ICD-10-CM

## 2015-01-04 DIAGNOSIS — Z79899 Other long term (current) drug therapy: Secondary | ICD-10-CM | POA: Insufficient documentation

## 2015-01-04 HISTORY — DX: Zoster without complications: B02.9

## 2015-01-04 MED ORDER — ACETAMINOPHEN-CODEINE #3 300-30 MG PO TABS: 1.0000 | ORAL_TABLET | Freq: Three times a day (TID) | ORAL | Status: DC | PRN

## 2015-01-04 MED ORDER — ACYCLOVIR 800 MG PO TABS: 800.0000 mg | ORAL_TABLET | Freq: Every day | ORAL | Status: DC

## 2015-01-04 NOTE — Assessment & Plan Note (Addendum)
A: acute shingles outbreak, patient is 7 days out. Still with significant pain P: Acyclovir Tylenol #3 for pain Plan for gabapentin/lyrica prn post-herpetic neuralgia Plan for zostavax in one year

## 2015-01-04 NOTE — Progress Notes (Signed)
Rash on leg and back, pain and burning  Possible shingles  Pain scale #7 No suicide thought in the past two weeks

## 2015-01-04 NOTE — Patient Instructions (Signed)
Jesse Torres was seen today for rash.  Diagnoses and all orders for this visit:  Shingles outbreak -     acyclovir (ZOVIRAX) 800 MG tablet; Take 1 tablet (800 mg total) by mouth 5 (five) times daily. For 7 days -     acetaminophen-codeine (TYLENOL #3) 300-30 MG tablet; Take 1 tablet by mouth every 8 (eight) hours as needed.   Gabapentin may be needed if pain and burning persist for many months, this is called postherpetic neuralgia   If   F/u in 4 weeks for recheck, sooner if needed   Dr. Armen Pickup     Shingles Shingles, which is also known as herpes zoster, is an infection that causes a painful skin rash and fluid-filled blisters. Shingles is not related to genital herpes, which is a sexually transmitted infection.   Shingles only develops in people who:  Have had chickenpox.  Have received the chickenpox vaccine. (This is rare.) CAUSES Shingles is caused by varicella-zoster virus (VZV). This is the same virus that causes chickenpox. After exposure to VZV, the virus stays in the body in an inactive (dormant) state. Shingles develops if the virus reactivates. This can happen many years after the initial exposure to VZV. It is not known what causes this virus to reactivate. RISK FACTORS People who have had chickenpox or received the chickenpox vaccine are at risk for shingles. Infection is more common in people who:  Are older than age 64.  Have a weakened defense (immune) system, such as those with HIV, AIDS, or cancer.  Are taking medicines that weaken the immune system, such as transplant medicines.  Are under great stress. SYMPTOMS Early symptoms of this condition include itching, tingling, and pain in an area on your skin. Pain may be described as burning, stabbing, or throbbing. A few days or weeks after symptoms start, a painful red rash appears, usually on one side of the body in a bandlike or beltlike pattern. The rash eventually turns into fluid-filled blisters that break  open, scab over, and dry up in about 2-3 weeks. At any time during the infection, you may also develop:  A fever.  Chills.  A headache.  An upset stomach. DIAGNOSIS This condition is diagnosed with a skin exam. Sometimes, skin or fluid samples are taken from the blisters before a diagnosis is made. These samples are examined under a microscope or sent to a lab for testing. TREATMENT There is no specific cure for this condition. Your health care provider will probably prescribe medicines to help you manage pain, recover more quickly, and avoid long-term problems. Medicines may include:  Antiviral drugs.  Anti-inflammatory drugs.  Pain medicines. If the area involved is on your face, you may be referred to a specialist, such as an eye doctor (ophthalmologist) or an ear, nose, and throat (ENT) doctor to help you avoid eye problems, chronic pain, or disability. HOME CARE INSTRUCTIONS Medicines  Take medicines only as directed by your health care provider.  Apply an anti-itch or numbing cream to the affected area as directed by your health care provider. Blister and Rash Care  Take a cool bath or apply cool compresses to the area of the rash or blisters as directed by your health care provider. This may help with pain and itching.  Keep your rash covered with a loose bandage (dressing). Wear loose-fitting clothing to help ease the pain of material rubbing against the rash.  Keep your rash and blisters clean with mild soap and cool water or as  directed by your health care provider.  Check your rash every day for signs of infection. These include redness, swelling, and pain that lasts or increases.  Do not pick your blisters.  Do not scratch your rash. General Instructions  Rest as directed by your health care provider.  Keep all follow-up visits as directed by your health care provider. This is important.  Until your blisters scab over, your infection can cause chickenpox in  people who have never had it or been vaccinated against it. To prevent this from happening, avoid contact with other people, especially:  Babies.  Pregnant women.  Children who have eczema.  Elderly people who have transplants.  People who have chronic illnesses, such as leukemia or AIDS. SEEK MEDICAL CARE IF:  Your pain is not relieved with prescribed medicines.  Your pain does not get better after the rash heals.  Your rash looks infected. Signs of infection include redness, swelling, and pain that lasts or increases. SEEK IMMEDIATE MEDICAL CARE IF:  The rash is on your face or nose.  You have facial pain, pain around your eye area, or loss of feeling on one side of your face.  You have ear pain or you have ringing in your ear.  You have loss of taste.  Your condition gets worse.   This information is not intended to replace advice given to you by your health care provider. Make sure you discuss any questions you have with your health care provider.   Document Released: 01/30/2005 Document Revised: 02/20/2014 Document Reviewed: 12/11/2013 Elsevier Interactive Patient Education [employer]

## 2015-01-04 NOTE — Progress Notes (Signed)
Patient ID: Jesse Torres, male   DOB: [birth year], 64 y.o.   MRN: [id number]   Subjective:  Patient ID: Jesse Torres, male    DOB: [birth year]  Age: 64 y.o. MRN: [id number]  CC: Rash   HPI [name] presents for rash  1. Rash: R lumbar and R thigh x one week. Burning rash. Painful. Very irritated.  No improvement over the past week. No personal history of being immunocompromised. No hx of similar rash. Some malaise and trouble sleeping since onset.  No fever. Has hx of chicken pox. Has not had shingles vaccine.    Social History  Substance Use Topics  . Smoking status: Former Smoker    Types: Cigarettes    Quit date: 02/06/1995  . Smokeless tobacco: Not on file  . Alcohol Use: No    Outpatient Prescriptions Prior to Visit  Medication Sig Dispense Refill  . aspirin EC 81 MG tablet Take 1 tablet (81 mg total) by mouth daily. 30 tablet 7  . carvedilol (COREG) 12.5 MG tablet TAKE 1 TABLET BY MOUTH 2 TIMES DAILY WITH A MEAL. 180 tablet 3  . furosemide (LASIX) 40 MG tablet TAKE 1 TABLET BY MOUTH DAILY. 90 tablet 1  . lisinopril (PRINIVIL,ZESTRIL) 40 MG tablet TAKE 1 TABLET BY MOUTH 2 TIMES DAILY. 180 tablet 3  . simvastatin (ZOCOR) 5 MG tablet Take 1 tablet (5 mg total) by mouth daily. 90 tablet 3  . spironolactone (ALDACTONE) 25 MG tablet Take 1 tablet (25 mg total) by mouth daily. 90 tablet 3   No facility-administered medications prior to visit.    ROS Review of Systems  Constitutional: Negative for fever, chills, fatigue and unexpected weight change.  Eyes: Negative for visual disturbance.  Respiratory: Negative for cough and shortness of breath.   Cardiovascular: Negative for chest pain, palpitations and leg swelling.  Gastrointestinal: Negative for nausea, vomiting, abdominal pain, diarrhea, constipation and blood in stool.  Endocrine: Negative for polydipsia, polyphagia and polyuria.  Musculoskeletal: Negative for myalgias, back pain, arthralgias, gait problem and  neck pain.  Skin: Positive for rash.  Allergic/Immunologic: Negative for immunocompromised state.  Hematological: Negative for adenopathy. Does not bruise/bleed easily.  Psychiatric/Behavioral: Negative for suicidal ideas, sleep disturbance and dysphoric mood. The patient is not nervous/anxious.     Objective:  BP 138/84 mmHg  Pulse 74  Temp(Src) 97.7 F (36.5 C) (Oral)  Resp 16  Ht 5\' 7"  (1.702 m)  Wt 216 lb (97.977 kg)  BMI 33.82 kg/m2  SpO2 98%  BP/Weight 01/04/2015 07/20/2014 05/06/2014  Systolic BP 138 126 130  Diastolic BP 84 82 80  Wt. (Lbs) 216 215 215  BMI 33.82 33.67 33.67   Physical Exam  Constitutional: He appears well-developed and well-nourished. No distress.  HENT:  Head: Normocephalic and atraumatic.  Neck: Normal range of motion. Neck supple.  Cardiovascular: Normal rate, regular rhythm, normal heart sounds and intact distal pulses.   Pulmonary/Chest: Effort normal and breath sounds normal.  Musculoskeletal: He exhibits no edema.  Neurological: He is alert.  Skin: Skin is warm and dry. Rash noted. Rash is vesicular. No erythema.     Psychiatric: He has a normal mood and affect.    Assessment & Plan:   Problem List Items Addressed This Visit    Shingles outbreak - Primary   Relevant Medications   acyclovir (ZOVIRAX) 800 MG tablet   acetaminophen-codeine (TYLENOL #3) 300-30 MG tablet    Other Visit Diagnoses    Healthcare maintenance  Relevant Orders    Flu Vaccine QUAD 36+ mos IM (Completed)       No orders of the defined types were placed in this encounter.    Follow-up: No Follow-up on file.   Boykin Nearing MD

## 2015-01-19 ENCOUNTER — Encounter: Payer: Self-pay | Admitting: Nurse Practitioner

## 2015-01-19 ENCOUNTER — Ambulatory Visit (INDEPENDENT_AMBULATORY_CARE_PROVIDER_SITE_OTHER): Payer: Self-pay | Admitting: Nurse Practitioner

## 2015-01-19 VITALS — BP 112/78 | HR 63 | Ht 67.0 in | Wt 221.0 lb

## 2015-01-19 DIAGNOSIS — I420 Dilated cardiomyopathy: Secondary | ICD-10-CM

## 2015-01-19 LAB — LIPID PANEL
Cholesterol: 98 mg/dL — ABNORMAL LOW (ref 125–200)
HDL: 30 mg/dL — ABNORMAL LOW (ref >=40)
LDL Cholesterol: 47 mg/dL (ref <130)
Total CHOL/HDL Ratio: 3.3 Ratio (ref <=5.0)
Triglycerides: 103 mg/dL (ref <150)
VLDL: 21 mg/dL (ref <30)

## 2015-01-19 LAB — BASIC METABOLIC PANEL
BUN: 24 mg/dL (ref 7–25)
CO2: 30 mmol/L (ref 20–31)
Calcium: 9.4 mg/dL (ref 8.6–10.3)
Chloride: 101 mmol/L (ref 98–110)
Creat: 1.14 mg/dL (ref 0.70–1.25)
Glucose, Bld: 99 mg/dL (ref 65–99)
Potassium: 4.7 mmol/L (ref 3.5–5.3)
Sodium: 138 mmol/L (ref 135–146)

## 2015-01-19 LAB — HEPATIC FUNCTION PANEL
ALT: 14 U/L (ref 9–46)
AST: 15 U/L (ref 10–35)
Albumin: 3.8 g/dL (ref 3.6–5.1)
Alkaline Phosphatase: 54 U/L (ref 40–115)
Bilirubin, Direct: 0.2 mg/dL (ref <=0.2)
Indirect Bilirubin: 0.6 mg/dL (ref 0.2–1.2)
Total Bilirubin: 0.8 mg/dL (ref 0.2–1.2)
Total Protein: 7.1 g/dL (ref 6.1–8.1)

## 2015-01-19 LAB — BRAIN NATRIURETIC PEPTIDE: Brain Natriuretic Peptide: 67.8 pg/mL (ref 0.0–100.0)

## 2015-01-19 MED ORDER — CARVEDILOL 12.5 MG PO TABS: ORAL_TABLET | ORAL | Status: DC

## 2015-01-19 MED ORDER — SPIRONOLACTONE 25 MG PO TABS
25.0000 mg | ORAL_TABLET | Freq: Every day | ORAL | Status: DC
Start: 2015-01-19 — End: 2015-07-20

## 2015-01-19 MED ORDER — FUROSEMIDE 40 MG PO TABS: ORAL_TABLET | ORAL | Status: DC

## 2015-01-19 MED ORDER — LISINOPRIL 40 MG PO TABS: ORAL_TABLET | ORAL | Status: DC

## 2015-01-19 MED ORDER — SIMVASTATIN 5 MG PO TABS: 5.0000 mg | ORAL_TABLET | Freq: Every day | ORAL | Status: DC

## 2015-01-19 NOTE — Patient Instructions (Addendum)
We will be checking the following labs today - BMET, HPF, BNP and lipids   Medication Instructions:    Continue with your current medicines.   I sent in your refills    Testing/Procedures To Be Arranged:  N/A  Follow-Up:   See me in 6 months.     Other Special Instructions:   N/A    If you need a refill on your cardiac medications before your next appointment, please call your pharmacy.   Call the [facility] office at [phone] if you have any questions, problems or concerns.

## 2015-01-19 NOTE — Progress Notes (Signed)
CARDIOLOGY OFFICE NOTE  Date:  01/19/2015    [name] Date of Birth: 1950-02-20 Medical Record #[id number]  PCP:  Jeanann Lewandowsky, MD  Cardiologist:  Former patient of Dr. Henrietta Hoover.     Chief Complaint  Patient presents with  . Congestive Heart Failure    Former patient of Dr. Henrietta Hoover.     History of Present Illness: Jesse Torres is a 64 y.o. male who presents today for a 6 month check. Former patient of Dr. Henrietta Hoover. 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 Initially his ejection fraction had been noted at 35%. There was no ischemia by nuclear scan. Cardiac catheterization was never done. He is been very compliant with his medications and has had normalization of left ventricular function.   Last seen in June and felt to be doing well.   Comes back today. Here with his daughter [name]. He continues to do well. No chest pain. Not short of breath. Not dizzy or lightheaded. Sometimes BP is "low" according to his daughter but he is totally asymptomatic. Needs meds refilled. Has had shingles and dealing with the neuralgia. He is pleased with how he is doing and has no concern.   Past Medical History  Diagnosis Date  . Hypertension   . 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  . Snoring     needs sleep study  . Pulmonary HTN (HCC)   . Chronic systolic CHF (congestive heart failure) (HCC)     a. Echocardiogram (02/05/2013): Mild LVH, EF 25-30%, diffuse HK, mild MR, mild LAE, mild RVE, moderately reduced RVSF, mild RAE, PASP 64, trivial effusion.;  b.  Echo (04/2013): EF 30-35%, diffuse HK, Gr 2 DD, mod LAE, mildly reduced RVSF, mild RAE, PASP 46  . Thrombocytopenia (HCC)   . Ejection fraction < 50%     Past Surgical History  Procedure Laterality Date  . None       Medications: Current Outpatient Prescriptions  Medication Sig Dispense Refill  .  aspirin EC 81 MG tablet Take 1 tablet (81 mg total) by mouth daily. 30 tablet 7  . carvedilol (COREG) 12.5 MG tablet TAKE 1 TABLET BY MOUTH 2 TIMES DAILY WITH A MEAL. 180 tablet 3  . furosemide (LASIX) 40 MG tablet TAKE 1 TABLET BY MOUTH DAILY. 90 tablet 3  . lisinopril (PRINIVIL,ZESTRIL) 40 MG tablet TAKE 1 TABLET BY MOUTH 2 TIMES DAILY. 180 tablet 3  . simvastatin (ZOCOR) 5 MG tablet Take 1 tablet (5 mg total) by mouth daily. 90 tablet 3  . spironolactone (ALDACTONE) 25 MG tablet Take 1 tablet (25 mg total) by mouth daily. 90 tablet 3   No current facility-administered medications for this visit.    Allergies: No Known Allergies  Social History: The patient  reports that he quit smoking about 19 years ago. His smoking use included Cigarettes. He does not have any smokeless tobacco history on file. He reports that he does not drink alcohol or use illicit drugs.   Family History: The patient's family history includes Heart attack in his mother; Stroke in his father.   Review of Systems: Please see the history of present illness.   Otherwise, the review of systems is positive for none.   All other systems are reviewed and negative.   Physical Exam: VS:  BP 112/78 mmHg  Pulse 63  Ht  (1.702 m)  Wt  221 lb (100.245 kg)  BMI 34.61 kg/m2  SpO2 99% .  BMI Body mass index is 34.61 kg/(m^2).  Wt Readings from Last 3 Encounters:  01/19/15 221 lb (100.245 kg)  01/04/15 216 lb (97.977 kg)  07/20/14 215 lb (97.523 kg)    General: Pleasant. Well developed, well nourished and in no acute distress.  HEENT: Normal. Neck: Supple, no JVD, carotid bruits, or masses noted.  Cardiac: Regular rate and rhythm. No murmurs, rubs, or gallops. No edema.  Respiratory:  Lungs are clear to auscultation bilaterally with normal work of breathing.  GI: Soft and nontender.  MS: No deformity or atrophy. Gait and ROM intact. Skin: Warm and dry. Color is normal.  Neuro:  Strength and sensation are intact  and no gross focal deficits noted.  Psych: Alert, appropriate and with normal affect.   LABORATORY DATA:  EKG:  EKG is not ordered today.  Lab Results  Component Value Date   WBC 4.6 02/25/2013   HGB 13.3 02/25/2013   HCT 40.9 02/25/2013   PLT * 02/25/2013    57.0 Result may be falsely decreased due to platelet clumping.   GLUCOSE 98 05/06/2014   CHOL 93 05/26/2013   TRIG 96.0 05/26/2013   HDL 29.10* 05/26/2013   LDLCALC 45 05/26/2013   ALT 15 05/26/2013   AST 14 05/26/2013   NA 137 05/06/2014   K 3.9 05/06/2014   CL 102 05/06/2014   CREATININE 1.15 05/06/2014   BUN 20 05/06/2014   CO2 31 05/06/2014   TSH 2.387 03/11/2013   HGBA1C 5.4 03/11/2013    BNP (last 3 results) No results for input(s): BNP in the last 8760 hours.  ProBNP (last 3 results) No results for input(s): PROBNP in the last 8760 hours.   Other Studies Reviewed Today:  Echo Study Conclusions 04/2014  - Left ventricle: The cavity size was normal. Wall thickness was normal. Systolic function was normal. The estimated ejection fraction was in the range of 60% to 65%. Wall motion was normal; there were no regional wall motion abnormalities. - Left atrium: The atrium was mildly dilated. - Pulmonary arteries: Systolic pressure was mildly increased. PA peak pressure: 33 mm Hg (S).  Assessment/Plan: 1. HTN Cardiomyopathy with chronic systolic HF - last echo with normal EF - doing well clinically - I would put him NYHA I/II. Medicines are refilled today. He would like to see me back.   2. HTN - BP is good on his current regimen.   3. HLD - on low dose statin therapy - rechecking labs today  Current medicines are reviewed with the patient today.  The patient does not have concerns regarding medicines other than what has been noted above.  The following changes have been made:  See above.  Labs/ tests ordered today include:    Orders Placed This Encounter  Procedures  . Basic metabolic  panel  . Hepatic function panel  . Lipid panel  . Brain natriuretic peptide     Disposition:   FU with me in 6 months. Labs today.   Patient is agreeable to this plan and will call if any problems develop in the interim.   Signed: Rosalio Macadamia, RN, ANP-C 01/19/2015 9:50 AM  [facility] [address] [place], [place]  [address] Phone: [phone] Fax: [phone]

## 2015-02-04 ENCOUNTER — Ambulatory Visit: Payer: Self-pay | Attending: Family Medicine | Admitting: Family Medicine

## 2015-02-04 ENCOUNTER — Encounter: Payer: Self-pay | Admitting: Family Medicine

## 2015-02-04 VITALS — BP 120/75 | HR 86 | Temp 98.1°F | Resp 16 | Ht 67.0 in | Wt 221.0 lb

## 2015-02-04 DIAGNOSIS — B0229 Other postherpetic nervous system involvement: Secondary | ICD-10-CM | POA: Insufficient documentation

## 2015-02-04 DIAGNOSIS — Z7982 Long term (current) use of aspirin: Secondary | ICD-10-CM | POA: Insufficient documentation

## 2015-02-04 DIAGNOSIS — B029 Zoster without complications: Secondary | ICD-10-CM | POA: Insufficient documentation

## 2015-02-04 DIAGNOSIS — Z79899 Other long term (current) drug therapy: Secondary | ICD-10-CM | POA: Insufficient documentation

## 2015-02-04 DIAGNOSIS — Z87891 Personal history of nicotine dependence: Secondary | ICD-10-CM | POA: Insufficient documentation

## 2015-02-04 MED ORDER — GABAPENTIN 300 MG PO CAPS
300.0000 mg | ORAL_CAPSULE | Freq: Two times a day (BID) | ORAL | Status: DC
Start: 2015-02-04 — End: 2016-01-26

## 2015-02-04 MED ORDER — DIPHENHYDRAMINE-ZINC ACETATE 1-0.1 % EX CREA: TOPICAL_CREAM | Freq: Three times a day (TID) | CUTANEOUS | Status: DC | PRN

## 2015-02-04 NOTE — Assessment & Plan Note (Signed)
Resolved shingles outbreak now with postherpetic neuralgia

## 2015-02-04 NOTE — Patient Instructions (Signed)
Jesse Torres was seen today for herpes zoster.  Diagnoses and all orders for this visit:  Shingles outbreak  Postherpetic neuralgia -     gabapentin (NEURONTIN) 300 MG capsule; Take 1 capsule (300 mg total) by mouth 2 (two) times daily. -     diphenhydrAMINE-zinc acetate (BENADRYL) cream; Apply topically 3 (three) times daily as needed for itching.   Shingles outbreak has resolved. You are healing well. The burning and itching you feel now is called postherpetic neuralgia.  Medications ordered Take gabapentin at bedtimes for first 3 days then increase to twice daily. It may cause sedation.   F/u in 2 months for postherpetic neuralgia,  Tdap and screening HIV and hep C at f/u   Dr. Armen Pickup   Postherpetic Neuralgia Postherpetic neuralgia (PHN) is nerve pain that occurs after a shingles infection. Shingles is a painful rash that appears on one side of the body, usually on your trunk or face. Shingles is caused by the varicella-zoster virus. This is the same virus that causes chickenpox. In people who have had chickenpox, the virus can resurface years later and cause shingles. You may have PHN if you continue to have pain for 3 months after your shingles rash has gone away. PHN appears in the same area where you had the shingles rash. For most people, PHN goes away within 1 year.  Getting a vaccination for shingles can prevent PHN. This vaccine is recommended for people older than 50. It may prevent shingles and may also lower your risk of PHN if you do get shingles. CAUSES PHN is caused by damage to your nerves from the varicella-zoster virus. This damage makes your nerves overly sensitive.  RISK FACTORS Aging is the biggest risk factor for developing PHN. Most people who get PHN are older than 60. Other risk factors include:  Having very bad pain before your shingles rash starts.  Having a very bad rash.  Having shingles in the nerve that supplies your face and eye (trigeminal nerve). SIGNS  AND SYMPTOMS Pain is the main symptom of PHN. The pain is often very bad and may be described as stabbing, burning, or feeling like an electric shock. The pain may come and go or may be there all the time. Pain may be triggered by light touches on the skin or changes in temperature. You may have itching along with the pain. DIAGNOSIS  Your health care provider may diagnose PHN based on your symptoms and your history of shingles. Lab studies and other diagnostic tests are usually not needed. TREATMENT  There is no cure for PHN. Treatment for PHN will focus on pain relief. Over-the-counter pain relievers do not usually relieve PHN pain. You may need to work with a pain specialist. Treatment may include:  Antidepressant medicines to help with pain and improve sleep.  Antiseizure medicines to relieve nerve pain.  Strong pain relievers (opioids).  A numbing patch worn on the skin (lidocaine patch). HOME CARE INSTRUCTIONS It may take a long time to recover from PHN. Work closely with your health care provider, and have a good support system at home.   Take all medicines as directed by your health care provider.  Wear loose, comfortable clothing.  Cover sensitive areas with a dressing to reduce friction from clothing rubbing on the area.  If cold does not make your pain worse, try applying a cool compress or cooling gel pack to the area.  Talk to your health care provider if you feel depressed or desperate. Living  with long-term pain can be depressing. SEEK MEDICAL CARE IF:  Your medicine is not helping.  You are struggling to manage your pain at home.   This information is not intended to replace advice given to you by your health care provider. Make sure you discuss any questions you have with your health care provider.   Document Released: 04/22/2002 Document Revised: 02/20/2014 Document Reviewed: 01/21/2013 Elsevier Interactive Patient Education [employer]

## 2015-02-04 NOTE — Progress Notes (Signed)
F/U shingles  Still with Burning and itching  No pain  Former smoker  No suicidal thoughts in the past two weeks

## 2015-02-04 NOTE — Progress Notes (Signed)
Patient ID: Jesse Torres, male   DOB: 1950-03-01, 64 y.o.   MRN: [id number]   Subjective:  Patient ID: NYHEIM CHAFFER, male    DOB: 1950/09/11  Age: 64 y.o. MRN: [id number]  CC: Herpes Zoster   HPI DEYLON DIDION presents for f/u shingles  1. Shingles:  Developed one month ago. He took acyclovir. He denies new lesions. He reports redness on the skin visible to him. He reports burning sensation and itching.    Social History  Substance Use Topics  . Smoking status: Former Smoker    Types: Cigarettes    Quit date: 02/06/1995  . Smokeless tobacco: Not on file  . Alcohol Use: No    Outpatient Prescriptions Prior to Visit  Medication Sig Dispense Refill  . aspirin EC 81 MG tablet Take 1 tablet (81 mg total) by mouth daily. 30 tablet 7  . carvedilol (COREG) 12.5 MG tablet TAKE 1 TABLET BY MOUTH 2 TIMES DAILY WITH A MEAL. 180 tablet 3  . furosemide (LASIX) 40 MG tablet TAKE 1 TABLET BY MOUTH DAILY. 90 tablet 3  . lisinopril (PRINIVIL,ZESTRIL) 40 MG tablet TAKE 1 TABLET BY MOUTH 2 TIMES DAILY. 180 tablet 3  . simvastatin (ZOCOR) 5 MG tablet Take 1 tablet (5 mg total) by mouth daily. 90 tablet 3  . spironolactone (ALDACTONE) 25 MG tablet Take 1 tablet (25 mg total) by mouth daily. 90 tablet 3   No facility-administered medications prior to visit.    ROS Review of Systems  Constitutional: Negative for fever, chills, fatigue and unexpected weight change.  Eyes: Negative for visual disturbance.  Respiratory: Negative for cough and shortness of breath.   Cardiovascular: Negative for chest pain, palpitations and leg swelling.  Gastrointestinal: Negative for nausea, vomiting, abdominal pain, diarrhea, constipation and blood in stool.  Endocrine: Negative for polydipsia, polyphagia and polyuria.  Musculoskeletal: Negative for myalgias, back pain, arthralgias, gait problem and neck pain.  Skin: Positive for rash.  Allergic/Immunologic: Negative for immunocompromised state.    Hematological: Negative for adenopathy. Does not bruise/bleed easily.  Psychiatric/Behavioral: Negative for suicidal ideas, sleep disturbance and dysphoric mood. The patient is not nervous/anxious.     Objective:  BP 120/75 mmHg  Pulse 86  Temp(Src) 98.1 F (36.7 C) (Oral)  Resp 16  Ht 5\' 7"  (1.702 m)  Wt 221 lb (100.245 kg)  BMI 34.61 kg/m2  SpO2 99%  BP/Weight 02/04/2015 01/19/2015 01/04/2015  Systolic BP 120 112 138  Diastolic BP 75 78 84  Wt. (Lbs) 221 221 216  BMI 34.61 34.61 33.82   Physical Exam  Constitutional: He appears well-developed and well-nourished. No distress.  HENT:  Head: Normocephalic and atraumatic.  Neck: Normal range of motion. Neck supple.  Cardiovascular: Normal rate, regular rhythm, normal heart sounds and intact distal pulses.   Pulmonary/Chest: Effort normal and breath sounds normal.  Musculoskeletal: He exhibits no edema.  Neurological: He is alert.  Skin: Skin is warm and dry. Rash noted. Rash is vesicular. No erythema.     Psychiatric: He has a normal mood and affect.    Assessment & Plan:   Problem List Items Addressed This Visit    Postherpetic neuralgia    A: postherpetic neuralgia in R L2 dermatome  P: Gabapentin Benadryl cream       Relevant Medications   gabapentin (NEURONTIN) 300 MG capsule   diphenhydrAMINE-zinc acetate (BENADRYL) cream   RESOLVED: Shingles outbreak - Primary (Chronic)    Resolved shingles outbreak now with postherpetic neuralgia  No orders of the defined types were placed in this encounter.    Follow-up: No Follow-up on file.   Boykin Nearing MD

## 2015-02-04 NOTE — Assessment & Plan Note (Signed)
A: postherpetic neuralgia in R L2 dermatome  P: Gabapentin Benadryl cream

## 2015-02-25 MED FILL — ?FUROSEMIDE 40 MG TABLET: 40 | 30 days supply | Qty: 30 | Fill #1

## 2015-02-25 MED FILL — CARVEDILOL 12.5 MG TABLET: 12.5 | 30 days supply | Qty: 60 | Fill #7

## 2015-02-25 MED FILL — LISINOPRIL 40 MG TABLET: 40 | 30 days supply | Qty: 60 | Fill #1

## 2015-03-04 MED FILL — GABAPENTIN 300 MG CAPSULE: 300 | 30 days supply | Qty: 60 | Fill #1

## 2015-03-11 MED FILL — SIMVASTATIN 5 MG TABLET: 5 | 30 days supply | Qty: 30 | Fill #2

## 2015-03-24 MED FILL — LISINOPRIL 40 MG TABLET: 40 | 30 days supply | Qty: 60 | Fill #2

## 2015-03-26 MED FILL — CARVEDILOL 12.5 MG TABLET: 12.5 | 30 days supply | Qty: 60 | Fill #8

## 2015-03-26 MED FILL — FUROSEMIDE 40 MG TABLET: 40 | 30 days supply | Qty: 30 | Fill #2

## 2015-04-06 MED FILL — SIMVASTATIN 5 MG TABLET: 5 | 30 days supply | Qty: 30 | Fill #3

## 2015-04-06 MED FILL — GABAPENTIN 300 MG CAPSULE: 300 | 30 days supply | Qty: 60 | Fill #2

## 2015-04-08 ENCOUNTER — Encounter: Payer: Self-pay | Admitting: Family Medicine

## 2015-04-08 ENCOUNTER — Ambulatory Visit: Payer: Commercial Managed Care - HMO | Attending: Family Medicine | Admitting: Family Medicine

## 2015-04-08 ENCOUNTER — Other Ambulatory Visit: Payer: Self-pay | Admitting: Family Medicine

## 2015-04-08 VITALS — BP 108/70 | HR 87 | Temp 97.5°F | Resp 16 | Ht 67.0 in | Wt 226.0 lb

## 2015-04-08 DIAGNOSIS — Z114 Encounter for screening for human immunodeficiency virus [HIV]: Secondary | ICD-10-CM | POA: Diagnosis not present

## 2015-04-08 DIAGNOSIS — B0229 Other postherpetic nervous system involvement: Secondary | ICD-10-CM

## 2015-04-08 DIAGNOSIS — Z87891 Personal history of nicotine dependence: Secondary | ICD-10-CM | POA: Diagnosis not present

## 2015-04-08 DIAGNOSIS — Z79899 Other long term (current) drug therapy: Secondary | ICD-10-CM | POA: Insufficient documentation

## 2015-04-08 DIAGNOSIS — Z7982 Long term (current) use of aspirin: Secondary | ICD-10-CM | POA: Diagnosis not present

## 2015-04-08 DIAGNOSIS — Z23 Encounter for immunization: Secondary | ICD-10-CM

## 2015-04-08 DIAGNOSIS — Z1159 Encounter for screening for other viral diseases: Secondary | ICD-10-CM

## 2015-04-08 DIAGNOSIS — B029 Zoster without complications: Secondary | ICD-10-CM | POA: Insufficient documentation

## 2015-04-08 LAB — HIV ANTIBODY (ROUTINE TESTING W REFLEX): HIV 1&2 Ab, 4th Generation: NONREACTIVE

## 2015-04-08 NOTE — Progress Notes (Signed)
   Subjective:  Patient ID: Jesse Torres, male    DOB: 07-Jul-1950  Age: 65 y.o. MRN: [id number]  CC: Herpes Zoster   HPI GERRITT BIGNELL presents for    1. F/u herpes zoster and post herpetic neuralgia: he is now rash and pain free. Still taking gabapentin. Would like to have zostavax when able.   Social History  Substance Use Topics  . Smoking status: Former Smoker    Types: Cigarettes    Quit date: 02/06/1995  . Smokeless tobacco: Not on file  . Alcohol Use: No    Outpatient Prescriptions Prior to Visit  Medication Sig Dispense Refill  . aspirin EC 81 MG tablet Take 1 tablet (81 mg total) by mouth daily. 30 tablet 7  . carvedilol (COREG) 12.5 MG tablet TAKE 1 TABLET BY MOUTH 2 TIMES DAILY WITH A MEAL. 180 tablet 3  . furosemide (LASIX) 40 MG tablet TAKE 1 TABLET BY MOUTH DAILY. 90 tablet 3  . gabapentin (NEURONTIN) 300 MG capsule Take 1 capsule (300 mg total) by mouth 2 (two) times daily. 60 capsule 2  . lisinopril (PRINIVIL,ZESTRIL) 40 MG tablet TAKE 1 TABLET BY MOUTH 2 TIMES DAILY. 180 tablet 3  . simvastatin (ZOCOR) 5 MG tablet Take 1 tablet (5 mg total) by mouth daily. 90 tablet 3  . spironolactone (ALDACTONE) 25 MG tablet Take 1 tablet (25 mg total) by mouth daily. 90 tablet 3  . diphenhydrAMINE-zinc acetate (BENADRYL) cream Apply topically 3 (three) times daily as needed for itching. (Patient not taking: Reported on 04/08/2015) 28.3 g 0   No facility-administered medications prior to visit.    ROS Review of Systems  Constitutional: Negative for fever, chills, fatigue and unexpected weight change.  Eyes: Negative for visual disturbance.  Respiratory: Negative for cough and shortness of breath.   Cardiovascular: Negative for chest pain, palpitations and leg swelling.  Gastrointestinal: Negative for nausea, vomiting, abdominal pain, diarrhea, constipation and blood in stool.  Endocrine: Negative for polydipsia, polyphagia and polyuria.  Musculoskeletal: Negative for  myalgias, back pain, arthralgias, gait problem and neck pain.  Skin: Negative for rash.  Allergic/Immunologic: Negative for immunocompromised state.  Hematological: Negative for adenopathy. Does not bruise/bleed easily.  Psychiatric/Behavioral: Negative for suicidal ideas, sleep disturbance and dysphoric mood. The patient is not nervous/anxious.     Objective:  BP 108/70 mmHg  Pulse 87  Temp(Src) 97.5 F (36.4 C) (Oral)  Resp 16  Ht 5\' 7"  (1.702 m)  Wt 226 lb (102.513 kg)  BMI 35.39 kg/m2  SpO2 98%  BP/Weight 04/08/2015 02/04/2015 01/19/2015  Systolic BP 108 120 112  Diastolic BP 70 75 78  Wt. (Lbs) 226 221 221  BMI 35.39 34.61 34.61   Physical Exam  Constitutional: He appears well-developed and well-nourished. No distress.  HENT:  Head: Normocephalic and atraumatic.  Neck: Normal range of motion. Neck supple.  Cardiovascular: Normal rate, regular rhythm, normal heart sounds and intact distal pulses.   Pulmonary/Chest: Effort normal and breath sounds normal.  Musculoskeletal: He exhibits no edema.  Neurological: He is alert.  Skin: Skin is warm and dry. No rash noted. No erythema.  Psychiatric: He has a normal mood and affect.   Assessment & Plan:    Follow-up: No Follow-up on file.   Dessa Phi MD

## 2015-04-08 NOTE — Progress Notes (Signed)
F/U Shingles No pain  No tobacco user  No suicidal thoughts in the past two weeks

## 2015-04-08 NOTE — Assessment & Plan Note (Signed)
Resolved post herpetic neuralgia

## 2015-04-08 NOTE — Patient Instructions (Signed)
Jesse Torres was seen today for herpes zoster.  Diagnoses and all orders for this visit:  Screening for HIV (human immunodeficiency virus) -     HIV antibody (with reflex)  Need for hepatitis C screening test -     Hepatitis C antibody, reflex  Other orders -     Tdap vaccine greater than or equal to 65yo IM   Lab results are available on your mychart. Please activate your mychart.  F/u in 6 months for check up and zostavax   Dr. Armen Pickup

## 2015-04-09 LAB — HEPATITIS C ANTIBODY: HCV Ab: NEGATIVE

## 2015-04-14 ENCOUNTER — Telehealth: Payer: Self-pay | Admitting: *Deleted

## 2015-04-14 NOTE — Telephone Encounter (Signed)
-----   Message from Josalyn Funches, MD sent at 04/09/2015  8:43 AM EST ----- Screening hep C and HIV negative 

## 2015-04-14 NOTE — Telephone Encounter (Signed)
-----   Message from Dessa Phi, MD sent at 04/09/2015  8:43 AM EST ----- Screening hep C and HIV negative

## 2015-04-14 NOTE — Telephone Encounter (Signed)
Done see previews note  

## 2015-04-14 NOTE — Telephone Encounter (Signed)
Date of birth verified by pt  Lab results given Pt verbalized understanding 

## 2015-04-15 MED FILL — SPIRONOLACTONE 25 MG TABLET: 25 | 30 days supply | Qty: 30 | Fill #1

## 2015-04-26 MED FILL — LISINOPRIL 40 MG TABLET: 40 | 30 days supply | Qty: 60 | Fill #3

## 2015-04-26 MED FILL — FUROSEMIDE 40 MG TABLET: 40 | 30 days supply | Qty: 30 | Fill #3

## 2015-04-26 MED FILL — CARVEDILOL 12.5 MG TABLET: 12.5 | 30 days supply | Qty: 60 | Fill #9

## 2015-05-10 MED FILL — SIMVASTATIN 5 MG TABLET: 5 | 30 days supply | Qty: 30 | Fill #4

## 2015-05-10 MED FILL — SPIRONOLACTONE 25 MG TABLET: 25 | 30 days supply | Qty: 30 | Fill #2

## 2015-05-26 MED FILL — LISINOPRIL 40 MG TABLET: 40 | 30 days supply | Qty: 60 | Fill #4

## 2015-05-26 MED FILL — CARVEDILOL 12.5 MG TABLET: 12.5 | 30 days supply | Qty: 60 | Fill #10

## 2015-05-26 MED FILL — FUROSEMIDE 40 MG TABLET: 40 | 30 days supply | Qty: 30 | Fill #4

## 2015-06-14 MED FILL — SPIRONOLACTONE 25 MG TABLET: 25 | 30 days supply | Qty: 30 | Fill #3

## 2015-06-14 MED FILL — SIMVASTATIN 5 MG TABLET: 5 | 30 days supply | Qty: 30 | Fill #5

## 2015-06-28 MED FILL — FUROSEMIDE 40 MG TABLET: 40 | 30 days supply | Qty: 30 | Fill #5

## 2015-06-28 MED FILL — LISINOPRIL 40 MG TABLET: 40 | 30 days supply | Qty: 60 | Fill #5

## 2015-06-29 MED FILL — CARVEDILOL 12.5 MG TABLET: 12.5 | 30 days supply | Qty: 60 | Fill #11

## 2015-07-14 MED FILL — SIMVASTATIN 5 MG TABLET: 5 | 30 days supply | Qty: 30 | Fill #6

## 2015-07-14 MED FILL — ?SPIRONOLACTONE 25 MG TABLE: 25 | 30 days supply | Qty: 30 | Fill #4

## 2015-07-20 ENCOUNTER — Encounter: Payer: Self-pay | Admitting: Nurse Practitioner

## 2015-07-20 ENCOUNTER — Ambulatory Visit (INDEPENDENT_AMBULATORY_CARE_PROVIDER_SITE_OTHER): Payer: Commercial Managed Care - HMO | Admitting: Nurse Practitioner

## 2015-07-20 VITALS — BP 118/76 | HR 53 | Ht 67.0 in | Wt 225.8 lb

## 2015-07-20 DIAGNOSIS — D696 Thrombocytopenia, unspecified: Secondary | ICD-10-CM | POA: Diagnosis not present

## 2015-07-20 DIAGNOSIS — E785 Hyperlipidemia, unspecified: Secondary | ICD-10-CM

## 2015-07-20 DIAGNOSIS — I5022 Chronic systolic (congestive) heart failure: Secondary | ICD-10-CM

## 2015-07-20 DIAGNOSIS — I509 Heart failure, unspecified: Secondary | ICD-10-CM | POA: Diagnosis not present

## 2015-07-20 DIAGNOSIS — I428 Other cardiomyopathies: Secondary | ICD-10-CM | POA: Diagnosis not present

## 2015-07-20 DIAGNOSIS — I429 Cardiomyopathy, unspecified: Secondary | ICD-10-CM

## 2015-07-20 DIAGNOSIS — I10 Essential (primary) hypertension: Secondary | ICD-10-CM | POA: Diagnosis not present

## 2015-07-20 LAB — BASIC METABOLIC PANEL
BUN: 40 mg/dL — ABNORMAL HIGH (ref 7–25)
CO2: 24 mmol/L (ref 20–31)
Calcium: 9.6 mg/dL (ref 8.6–10.3)
Chloride: 100 mmol/L (ref 98–110)
Creat: 1.72 mg/dL — ABNORMAL HIGH (ref 0.70–1.25)
Glucose, Bld: 138 mg/dL — ABNORMAL HIGH (ref 65–99)
Potassium: 5.6 mmol/L — ABNORMAL HIGH (ref 3.5–5.3)
Sodium: 135 mmol/L (ref 135–146)

## 2015-07-20 LAB — HEPATIC FUNCTION PANEL
ALT: 18 U/L (ref 9–46)
AST: 13 U/L (ref 10–35)
Albumin: 4.5 g/dL (ref 3.6–5.1)
Alkaline Phosphatase: 70 U/L (ref 40–115)
Bilirubin, Direct: 0.2 mg/dL (ref <=0.2)
Indirect Bilirubin: 0.8 mg/dL (ref 0.2–1.2)
Total Bilirubin: 1 mg/dL (ref 0.2–1.2)
Total Protein: 7.1 g/dL (ref 6.1–8.1)

## 2015-07-20 LAB — CBC
HCT: 40.4 % (ref 38.5–50.0)
Hemoglobin: 13.6 g/dL (ref 13.2–17.1)
MCH: 29.2 pg (ref 27.0–33.0)
MCHC: 33.7 g/dL (ref 32.0–36.0)
MCV: 86.9 fL (ref 80.0–100.0)
MPV: 12 fL (ref 7.5–12.5)
Platelets: 89 10*3/uL — ABNORMAL LOW (ref 140–400)
RBC: 4.65 MIL/uL (ref 4.20–5.80)
RDW: 14.9 % (ref 11.0–15.0)
WBC: 5.9 10*3/uL (ref 3.8–10.8)

## 2015-07-20 LAB — LIPID PANEL
Cholesterol: 114 mg/dL — ABNORMAL LOW (ref 125–200)
HDL: 27 mg/dL — ABNORMAL LOW (ref >=40)
LDL Cholesterol: 55 mg/dL (ref <130)
Total CHOL/HDL Ratio: 4.2 Ratio (ref <=5.0)
Triglycerides: 161 mg/dL — ABNORMAL HIGH (ref <150)
VLDL: 32 mg/dL — ABNORMAL HIGH (ref <30)

## 2015-07-20 MED ORDER — FUROSEMIDE 40 MG PO TABS: ORAL_TABLET | ORAL | Status: DC

## 2015-07-20 MED ORDER — SPIRONOLACTONE 25 MG PO TABS: 25.0000 mg | ORAL_TABLET | Freq: Every day | ORAL | Status: DC

## 2015-07-20 MED ORDER — SILDENAFIL CITRATE 100 MG PO TABS: 50.0000 mg | ORAL_TABLET | Freq: Every day | ORAL | Status: DC | PRN

## 2015-07-20 MED ORDER — SIMVASTATIN 5 MG PO TABS: 5.0000 mg | ORAL_TABLET | Freq: Every day | ORAL | Status: DC

## 2015-07-20 MED ORDER — LISINOPRIL 40 MG PO TABS: ORAL_TABLET | ORAL | Status: DC

## 2015-07-20 MED ORDER — CARVEDILOL 12.5 MG PO TABS: ORAL_TABLET | ORAL | Status: DC

## 2015-07-20 NOTE — Patient Instructions (Addendum)
We will be checking the following labs today -  BMET, CBC, HPF and Lipids   Medication Instructions:    Continue with your current medicines.   I have refilled your medicines today  I have sent in a prescription for Viagra today - 100 mg to take just 1/2 tablet 30 minutes prior to sexual intercourse    Testing/Procedures To Be Arranged:  N/A  Follow-Up:   See me in 6 months    Other Special Instructions:    Get back to walking.     If you need a refill on your cardiac medications before your next appointment, please call your pharmacy.   Call the [facility] office at [phone] if you have any questions, problems or concerns.

## 2015-07-20 NOTE — Progress Notes (Signed)
CARDIOLOGY OFFICE NOTE  Date:  07/20/2015    Jesse Torres Date of Birth: Dec 01, 1950 Medical Record #[id number]  PCP:  Lora Paula, MD  Cardiologist:  Former patient of Dr. Henrietta Hoover    Chief Complaint  Patient presents with  . Hypertension  . Hyperlipidemia  . Cardiomyopathy    6 month check - former patient of Dr. Myrtis Ser    History of Present Illness: Jesse Torres is a 65 y.o. male who presents today for a 6 month check. Former patient of Dr. Henrietta Hoover. 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 Initially his ejection fraction was noted at 35%. There was no ischemia by past nuclear scan. Cardiac catheterization was never done. He is been very compliant with his medications and has had normalization of left ventricular function - last echo from March of 2016.   Last seen in December and was felt to be doing ok.  Comes back today. Here alone today. He feels like he is doing well. No chest pain. Not short of breath. Has gained a little weight but notes his girlfriend is a "really good cook". Asking about Viagra. Not on nitrates. Not dizzy or lightheaded. Compliant with his medicines. He is happy with how he is doing.   Past Medical History  Diagnosis Date  . Hypertension   . 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  . Snoring     needs sleep study  . Pulmonary HTN (HCC)   . Chronic systolic CHF (congestive heart failure) (HCC)     a. Echocardiogram (02/05/2013): Mild LVH, EF 25-30%, diffuse HK, mild MR, mild LAE, mild RVE, moderately reduced RVSF, mild RAE, PASP 64, trivial effusion.;  b.  Echo (04/2013): EF 30-35%, diffuse HK, Gr 2 DD, mod LAE, mildly reduced RVSF, mild RAE, PASP 46  . Thrombocytopenia (HCC)   . Ejection fraction < 50%   . Thigh shingles 01/04/2015    R L2 dermatome     Past Surgical History  Procedure Laterality Date  . None        Medications: Current Outpatient Prescriptions  Medication Sig Dispense Refill  . aspirin EC 81 MG tablet Take 1 tablet (81 mg total) by mouth daily. 30 tablet 7  . carvedilol (COREG) 12.5 MG tablet TAKE 1 TABLET BY MOUTH 2 TIMES DAILY WITH A MEAL. 180 tablet 3  . furosemide (LASIX) 40 MG tablet TAKE 1 TABLET BY MOUTH DAILY. 90 tablet 3  . gabapentin (NEURONTIN) 300 MG capsule Take 1 capsule (300 mg total) by mouth 2 (two) times daily. 60 capsule 2  . lisinopril (PRINIVIL,ZESTRIL) 40 MG tablet TAKE 1 TABLET BY MOUTH 2 TIMES DAILY. 180 tablet 3  . simvastatin (ZOCOR) 5 MG tablet Take 1 tablet (5 mg total) by mouth daily. 90 tablet 3   No current facility-administered medications for this visit.    Allergies: No Known Allergies  Social History: The patient  reports that he quit smoking about 20 years ago. His smoking use included Cigarettes. He does not have any smokeless tobacco history on file. He reports that he does not drink alcohol or use illicit drugs.   Family History: The patient's family history includes Heart attack in his mother; Stroke in his father.   Review of Systems: Please see the history of present illness.   Otherwise, the review of systems is positive for having shingles since last visit  here.   All other systems are reviewed and negative.   Physical Exam: VS:  BP 118/76 mmHg  Pulse 53  Ht 5\' 7"  (1.702 m)  Wt 225 lb 12.8 oz (102.422 kg)  BMI 35.36 kg/m2 .  BMI Body mass index is 35.36 kg/(m^2).  Wt Readings from Last 3 Encounters:  07/20/15 225 lb 12.8 oz (102.422 kg)  04/08/15 226 lb (102.513 kg)  02/04/15 221 lb (100.245 kg)    General: Pleasant. Obese male who is alert and in no acute distress. He has gained 4 pounds since last visit.  HEENT: Normal. Neck: Supple, no JVD, carotid bruits, or masses noted.  Cardiac: Heart tones distant. No edema.  Respiratory:  Lungs are clear to auscultation bilaterally with normal work of breathing.  GI: Soft  and nontender.  MS: No deformity or atrophy. Gait and ROM intact. Skin: Warm and dry. Color is normal.  Neuro:  Strength and sensation are intact and no gross focal deficits noted.  Psych: Alert, appropriate and with normal affect.   LABORATORY DATA:  EKG:  EKG is ordered today. This demonstrates sinus bradycardia with RBBB and ? Inferior infarct - unchanged. No PVCs on today's tracing as compared to last EKG.  Lab Results  Component Value Date   WBC 4.6 02/25/2013   HGB 13.3 02/25/2013   HCT 40.9 02/25/2013   PLT * 02/25/2013    57.0 Result may be falsely decreased due to platelet clumping.   GLUCOSE 99 01/19/2015   CHOL 98* 01/19/2015   TRIG 103 01/19/2015   HDL 30* 01/19/2015   LDLCALC 47 01/19/2015   ALT 14 01/19/2015   AST 15 01/19/2015   NA 138 01/19/2015   K 4.7 01/19/2015   CL 101 01/19/2015   CREATININE 1.14 01/19/2015   BUN 24 01/19/2015   CO2 30 01/19/2015   TSH 2.387 03/11/2013   HGBA1C 5.4 03/11/2013    BNP (last 3 results)  Recent Labs  01/19/15 1014  BNP 67.8    ProBNP (last 3 results) No results for input(s): PROBNP in the last 8760 hours.   Other Studies Reviewed Today:  Echo Study Conclusions 04/2014  - Left ventricle: The cavity size was normal. Wall thickness was normal. Systolic function was normal. The estimated ejection fraction was in the range of 60% to 65%. Wall motion was normal; there were no regional wall motion abnormalities. - Left atrium: The atrium was mildly dilated. - Pulmonary arteries: Systolic pressure was mildly increased. PA peak pressure: 33 mm Hg (S).  Assessment/Plan: 1. HTN Cardiomyopathy with chronic systolic HF - last echo with normal EF - doing well clinically - I would put him NYHA I/II. Medicines are refilled today. Encouraged him to get back on his walking program.   2. HTN - BP is good on his current regimen.   3. HLD - on low dose statin therapy - rechecking labs today  4. Chronic  thrombocytopenia - recheck CBC today  5. ED - will let him try Viagra - try to give coupon card today.   Current medicines are reviewed with the patient today.  The patient does not have concerns regarding medicines other than what has been noted above.  The following changes have been made:  See above.  Labs/ tests ordered today include:    Orders Placed This Encounter  Procedures  . Basic metabolic panel  . CBC  . Hepatic function panel  . Lipid panel  . EKG 12-Lead     Disposition:  FU with me in 6 months.  Patient is agreeable to this plan and will call if any problems develop in the interim.   Signed: Rosalio Macadamia, RN, ANP-C 07/20/2015 8:12 AM  [facility] [address] [place], [place]  [address] Phone: [phone] Fax: [phone]

## 2015-07-21 ENCOUNTER — Other Ambulatory Visit: Payer: Self-pay | Admitting: *Deleted

## 2015-07-21 DIAGNOSIS — E875 Hyperkalemia: Secondary | ICD-10-CM

## 2015-07-21 MED ORDER — SPIRONOLACTONE 25 MG PO TABS: 12.5000 mg | ORAL_TABLET | Freq: Every day | ORAL | Status: DC

## 2015-07-22 ENCOUNTER — Telehealth: Payer: Self-pay | Admitting: Nurse Practitioner

## 2015-07-22 NOTE — Telephone Encounter (Signed)
Notes Recorded by Debbe Bales on 07/21/2015 at 4:19 PM Pt aware of lab results is agreeable to plan. Discussed foods high in potassium. Will cut back aldactone ( 12.5 mg ) daily, change on medication list. Pt will come in on June 14 for repeat bmet. Appointment made orders in and linked. Notes Recorded by Rosalio Macadamia, NP on 07/21/2015 at 7:33 AM Ok to report. Labs are stable - but potassium and kidney function are off - needs to restrict foods high in potassium. Will need to cut the aldactone in half to 12.5 mg a day BMET in one week.       Ref Range 2d ago  52mo ago     Sodium 135 - 146 mmol/L 135 138    Potassium 3.5 - 5.3 mmol/L 5.6 (H) 4.7   Comments: No visible hemolysis.    Chloride 98 - 110 mmol/L 100 101    CO2 20 - 31 mmol/L 24 30    Glucose, Bld 65 - 99 mg/dL 111 (H) 99    BUN 7 - 25 mg/dL 40 (H) 24    Creat 7.35 - 1.25 mg/dL 6.70 (H) 1.41    Calcium 8.6 - 10.3 mg/dL 9.6 9.4   Resulting Agency  [facility]    Narrative

## 2015-07-22 NOTE — Telephone Encounter (Signed)
New message    APP nurse called on yesterday to give test results-- patients has some questions to discuss.

## 2015-07-22 NOTE — Telephone Encounter (Signed)
Patient could not remember which medication he was to decrease.  Reviewed instructions with him and he stated understanding. He will decrease aldactone as instructed.

## 2015-07-28 ENCOUNTER — Other Ambulatory Visit (INDEPENDENT_AMBULATORY_CARE_PROVIDER_SITE_OTHER): Payer: Commercial Managed Care - HMO

## 2015-07-28 DIAGNOSIS — E875 Hyperkalemia: Secondary | ICD-10-CM

## 2015-07-28 LAB — BASIC METABOLIC PANEL
BUN: 65 mg/dL — ABNORMAL HIGH (ref 7–25)
CO2: 21 mmol/L (ref 20–31)
Calcium: 9 mg/dL (ref 8.6–10.3)
Chloride: 109 mmol/L (ref 98–110)
Creat: 1.89 mg/dL — ABNORMAL HIGH (ref 0.70–1.25)
Glucose, Bld: 116 mg/dL — ABNORMAL HIGH (ref 65–99)
Potassium: 5 mmol/L (ref 3.5–5.3)
Sodium: 136 mmol/L (ref 135–146)

## 2015-07-28 MED FILL — CARVEDILOL 12.5 MG TABLET: 12.5 | 30 days supply | Qty: 60 | Fill #0

## 2015-07-28 MED FILL — LISINOPRIL 40 MG TABLET: 40 | 30 days supply | Qty: 60 | Fill #6

## 2015-07-28 MED FILL — FUROSEMIDE 40 MG TABLET: 40 | 30 days supply | Qty: 30 | Fill #6

## 2015-07-29 ENCOUNTER — Telehealth: Payer: Self-pay | Admitting: *Deleted

## 2015-07-29 DIAGNOSIS — R799 Abnormal finding of blood chemistry, unspecified: Secondary | ICD-10-CM | POA: Insufficient documentation

## 2015-07-29 DIAGNOSIS — R7989 Other specified abnormal findings of blood chemistry: Secondary | ICD-10-CM | POA: Insufficient documentation

## 2015-07-29 MED ORDER — FUROSEMIDE 20 MG PO TABS: 20.0000 mg | ORAL_TABLET | Freq: Every day | ORAL | Status: DC

## 2015-07-29 NOTE — Telephone Encounter (Signed)
Notified the pt that per [name] NP, its ok to report that his labs show his kidney function is actually worse.  Informed the pt that per [name], she recommends that we stop his Aldactone altogether, decrease his lasix to 20 mg po daily, and have him come in for a repeat BMET in one week.  Also informed the pt that per [name], she recommends that he increase his oral intake of water. Confirmed the pharmacy of choice with the pt.  Scheduled the pt a lab appt to check a bmet, for next Wednesday 08/04/15, as the pt requested this day.  Pt verbalized understanding and agrees with this plan.

## 2015-07-29 NOTE — Telephone Encounter (Signed)
-----   Message from Rosalio Macadamia, NP sent at 07/28/2015  8:56 PM EDT ----- Ok to report. His kidney function is actually worse. Will stop the Aldactone altogether. Cut Lasix back to just 20 mg a day BMET in one week.  Needs to increase oral intake of water.

## 2015-08-04 ENCOUNTER — Other Ambulatory Visit (INDEPENDENT_AMBULATORY_CARE_PROVIDER_SITE_OTHER): Payer: Commercial Managed Care - HMO | Admitting: *Deleted

## 2015-08-04 DIAGNOSIS — R748 Abnormal levels of other serum enzymes: Secondary | ICD-10-CM

## 2015-08-04 DIAGNOSIS — R799 Abnormal finding of blood chemistry, unspecified: Secondary | ICD-10-CM

## 2015-08-04 DIAGNOSIS — R7989 Other specified abnormal findings of blood chemistry: Secondary | ICD-10-CM

## 2015-08-04 LAB — BASIC METABOLIC PANEL
BUN: 26 mg/dL — ABNORMAL HIGH (ref 7–25)
CO2: 24 mmol/L (ref 20–31)
Calcium: 8.7 mg/dL (ref 8.6–10.3)
Chloride: 108 mmol/L (ref 98–110)
Creat: 1.34 mg/dL — ABNORMAL HIGH (ref 0.70–1.25)
Glucose, Bld: 150 mg/dL — ABNORMAL HIGH (ref 65–99)
Potassium: 4.2 mmol/L (ref 3.5–5.3)
Sodium: 142 mmol/L (ref 135–146)

## 2015-08-05 ENCOUNTER — Telehealth: Payer: Self-pay | Admitting: *Deleted

## 2015-08-05 ENCOUNTER — Other Ambulatory Visit: Payer: Self-pay | Admitting: *Deleted

## 2015-08-05 DIAGNOSIS — I10 Essential (primary) hypertension: Secondary | ICD-10-CM

## 2015-08-05 DIAGNOSIS — R799 Abnormal finding of blood chemistry, unspecified: Secondary | ICD-10-CM

## 2015-08-05 DIAGNOSIS — R7989 Other specified abnormal findings of blood chemistry: Secondary | ICD-10-CM

## 2015-08-05 MED ORDER — SILDENAFIL CITRATE 100 MG PO TABS: 50.0000 mg | ORAL_TABLET | Freq: Every day | ORAL | Status: DC | PRN

## 2015-08-05 MED ORDER — SILDENAFIL CITRATE 50 MG PO TABS: 50.0000 mg | ORAL_TABLET | Freq: Every day | ORAL | Status: DC | PRN

## 2015-08-05 NOTE — Telephone Encounter (Signed)
-----   Message from Rosalio Macadamia, NP sent at 08/04/2015  8:45 PM EDT ----- Ok to report. Labs are stable - his kidney function looks so much better - would continue on current regimen. BMET in one month.

## 2015-08-05 NOTE — Telephone Encounter (Signed)
Notified the pt that per [name] NP, its ok to report that his labs are stable, and his kidney function looks so much better.  Informed the pt that [name] recommends that he continue his current regimen and have a repeat BMET in one month.  Scheduled the pts lab to check a bmet for 09/06/15, as this date was requested by the pt.  Pt verbalized understanding and agrees with this plan.

## 2015-08-18 MED FILL — SIMVASTATIN 5 MG TABLET: 5 | 30 days supply | Qty: 30 | Fill #0

## 2015-08-25 MED FILL — CARVEDILOL 12.5 MG TABLET: 12.5 | 30 days supply | Qty: 60 | Fill #1

## 2015-08-25 MED FILL — LISINOPRIL 40 MG TABLET: 40 | 30 days supply | Qty: 60 | Fill #7

## 2015-09-06 ENCOUNTER — Encounter (INDEPENDENT_AMBULATORY_CARE_PROVIDER_SITE_OTHER): Payer: Self-pay

## 2015-09-06 ENCOUNTER — Other Ambulatory Visit: Payer: Commercial Managed Care - HMO | Admitting: *Deleted

## 2015-09-06 ENCOUNTER — Telehealth: Payer: Self-pay

## 2015-09-06 DIAGNOSIS — Z79899 Other long term (current) drug therapy: Secondary | ICD-10-CM

## 2015-09-06 DIAGNOSIS — I10 Essential (primary) hypertension: Secondary | ICD-10-CM | POA: Diagnosis not present

## 2015-09-06 DIAGNOSIS — R7989 Other specified abnormal findings of blood chemistry: Secondary | ICD-10-CM

## 2015-09-06 DIAGNOSIS — R799 Abnormal finding of blood chemistry, unspecified: Secondary | ICD-10-CM | POA: Diagnosis not present

## 2015-09-06 DIAGNOSIS — R748 Abnormal levels of other serum enzymes: Secondary | ICD-10-CM | POA: Diagnosis not present

## 2015-09-06 LAB — BASIC METABOLIC PANEL
BUN: 17 mg/dL (ref 7–25)
CO2: 29 mmol/L (ref 20–31)
Calcium: 9 mg/dL (ref 8.6–10.3)
Chloride: 103 mmol/L (ref 98–110)
Creat: 1.2 mg/dL (ref 0.70–1.25)
Glucose, Bld: 227 mg/dL — ABNORMAL HIGH (ref 65–99)
Potassium: 3.9 mmol/L (ref 3.5–5.3)
Sodium: 140 mmol/L (ref 135–146)

## 2015-09-06 MED ORDER — SPIRONOLACTONE 25 MG PO TABS: 12.5000 mg | ORAL_TABLET | Freq: Every day | ORAL | 3 refills | Status: DC

## 2015-09-06 MED FILL — SPIRONOLACTONE 25 MG TABLET: 25 | 30 days supply | Qty: 45 | Fill #0

## 2015-09-06 NOTE — Telephone Encounter (Signed)
-----   Message from Rosalio Macadamia, NP sent at 09/06/2015  4:32 PM EDT ----- Ok to report. His kidney function is now totally normal. I would like to try and add back very low dose Aldactone if he is willing.  Aldactone 12.5 mg once a day with BMET in 2 weeks. If kidney function worsens again, will leave off permanently.

## 2015-09-06 NOTE — Telephone Encounter (Signed)
Called patient with results. Per [name] NP, Ok to report. His kidney function is now totally normal. I would like to try and add back very low dose Aldactone if he is willing. Aldactone 12.5 mg once a day with BMET in 2 weeks. If kidney function worsens again, will leave off permanently. Patient verbalized understanding, and will have lab work repeated on 09/20/15.

## 2015-09-15 ENCOUNTER — Other Ambulatory Visit: Payer: Self-pay | Admitting: *Deleted

## 2015-09-15 DIAGNOSIS — R7989 Other specified abnormal findings of blood chemistry: Secondary | ICD-10-CM

## 2015-09-15 DIAGNOSIS — R799 Abnormal finding of blood chemistry, unspecified: Secondary | ICD-10-CM

## 2015-09-15 DIAGNOSIS — Z79899 Other long term (current) drug therapy: Secondary | ICD-10-CM

## 2015-09-15 MED ORDER — LISINOPRIL 40 MG PO TABS: ORAL_TABLET | ORAL | 3 refills | Status: DC

## 2015-09-15 MED ORDER — SPIRONOLACTONE 25 MG PO TABS: 12.5000 mg | ORAL_TABLET | Freq: Every day | ORAL | 3 refills | Status: DC

## 2015-09-15 MED ORDER — CARVEDILOL 12.5 MG PO TABS: ORAL_TABLET | ORAL | 3 refills | Status: DC

## 2015-09-15 MED ORDER — FUROSEMIDE 20 MG PO TABS: 20.0000 mg | ORAL_TABLET | Freq: Every day | ORAL | 3 refills | Status: DC

## 2015-09-15 NOTE — Addendum Note (Signed)
Addended by: Sydnee Cabal R on: 09/15/2015 02:20 PM   Modules accepted: Orders

## 2015-09-17 MED FILL — SIMVASTATIN 5 MG TABLET: 5 | 30 days supply | Qty: 30 | Fill #1

## 2015-09-20 ENCOUNTER — Other Ambulatory Visit (INDEPENDENT_AMBULATORY_CARE_PROVIDER_SITE_OTHER): Payer: Commercial Managed Care - HMO | Admitting: *Deleted

## 2015-09-20 DIAGNOSIS — Z79899 Other long term (current) drug therapy: Secondary | ICD-10-CM | POA: Diagnosis not present

## 2015-09-20 LAB — BASIC METABOLIC PANEL
BUN: 19 mg/dL (ref 7–25)
CO2: 29 mmol/L (ref 20–31)
Calcium: 9.5 mg/dL (ref 8.6–10.3)
Chloride: 102 mmol/L (ref 98–110)
Creat: 1.14 mg/dL (ref 0.70–1.25)
Glucose, Bld: 174 mg/dL — ABNORMAL HIGH (ref 65–99)
Potassium: 5 mmol/L (ref 3.5–5.3)
Sodium: 138 mmol/L (ref 135–146)

## 2015-09-21 ENCOUNTER — Other Ambulatory Visit: Payer: Self-pay | Admitting: *Deleted

## 2015-09-21 DIAGNOSIS — E876 Hypokalemia: Secondary | ICD-10-CM

## 2015-10-19 MED FILL — SIMVASTATIN 5 MG TABLET: 5 | 30 days supply | Qty: 30 | Fill #2

## 2015-10-22 ENCOUNTER — Other Ambulatory Visit: Payer: Commercial Managed Care - HMO | Admitting: *Deleted

## 2015-10-22 DIAGNOSIS — E876 Hypokalemia: Secondary | ICD-10-CM | POA: Diagnosis not present

## 2015-10-22 LAB — BASIC METABOLIC PANEL
BUN: 19 mg/dL (ref 7–25)
CO2: 27 mmol/L (ref 20–31)
Calcium: 9.1 mg/dL (ref 8.6–10.3)
Chloride: 104 mmol/L (ref 98–110)
Creat: 1.25 mg/dL (ref 0.70–1.25)
Glucose, Bld: 217 mg/dL — ABNORMAL HIGH (ref 65–99)
Potassium: 3.9 mmol/L (ref 3.5–5.3)
Sodium: 141 mmol/L (ref 135–146)

## 2015-11-15 DIAGNOSIS — H25811 Combined forms of age-related cataract, right eye: Secondary | ICD-10-CM | POA: Diagnosis not present

## 2015-11-15 DIAGNOSIS — H25812 Combined forms of age-related cataract, left eye: Secondary | ICD-10-CM | POA: Diagnosis not present

## 2015-11-19 MED FILL — PREDNISOLONE AC 1% EYE DROP: 1 | 30 days supply | Qty: 10 | Fill #0

## 2015-11-19 MED FILL — OFLOXACIN 0.3% EYE DROPS: 0.3 | 15 days supply | Qty: 5 | Fill #0

## 2015-11-22 MED FILL — KETOROLAC 0.4% OPHTH SOLN: 0.4 | 15 days supply | Qty: 5 | Fill #0

## 2015-11-22 MED FILL — SIMVASTATIN 5 MG TABLET: 5 | 30 days supply | Qty: 30 | Fill #3

## 2015-12-01 DIAGNOSIS — H52221 Regular astigmatism, right eye: Secondary | ICD-10-CM | POA: Diagnosis not present

## 2015-12-01 DIAGNOSIS — H25811 Combined forms of age-related cataract, right eye: Secondary | ICD-10-CM | POA: Diagnosis not present

## 2015-12-01 DIAGNOSIS — H2511 Age-related nuclear cataract, right eye: Secondary | ICD-10-CM | POA: Diagnosis not present

## 2015-12-07 MED FILL — KETOROLAC 0.4% OPHTH SOLN: 0.4 | 15 days supply | Qty: 5 | Fill #1

## 2015-12-07 MED FILL — OFLOXACIN 0.3% EYE DROPS: 0.3 | 15 days supply | Qty: 5 | Fill #1

## 2015-12-22 MED FILL — SIMVASTATIN 5 MG TABLET: 5 | 30 days supply | Qty: 30 | Fill #4

## 2016-01-25 NOTE — Progress Notes (Signed)
CARDIOLOGY OFFICE NOTE  Date:  01/26/2016    Jesse Torres Date of Birth: 06-Nov-1950 Medical Record #[id number]  PCP:  Lora Paula, MD  Cardiologist:  Tyrone Sage    Chief Complaint  Patient presents with  . Hypertension  . Cardiomyopathy    6 month check     History of Present Illness: Jesse Torres is a 65 y.o. male who presents today for a 6 month check. Former patient of Dr. Henrietta Hoover. 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 Initially his ejection fraction was noted at 35%. There was no ischemia by past nuclear scan. Cardiac catheterization was never done. He is been very compliant with his medications and has had normalization of left ventricular function - last echo from March of 2016.   Last seen in June and was felt to be doing ok. Labs showed worsening CKD. Had to cut back his aldactone.   Comes back today. Here alone today. He is doing well. Has gained some weight - notes he has stopped walking due to having cataract surgery but getting "back at it". No chest pain. Not short of breath. BP better at home. Has just taken his AM meds before coming here today. Overall, he is happy with how he is doing clinically.   Past Medical History:  Diagnosis Date  . 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  . Chronic systolic CHF (congestive heart failure) (HCC)    a. Echocardiogram (02/05/2013): Mild LVH, EF 25-30%, diffuse HK, mild MR, mild LAE, mild RVE, moderately reduced RVSF, mild RAE, PASP 64, trivial effusion.;  b.  Echo (04/2013): EF 30-35%, diffuse HK, Gr 2 DD, mod LAE, mildly reduced RVSF, mild RAE, PASP 46  . Ejection fraction < 50%   . Hypertension   . Pulmonary HTN   . Snoring    needs sleep study  . Thigh shingles 01/04/2015   R L2 dermatome   . Thrombocytopenia (HCC)     Past Surgical History:  Procedure Laterality Date  . none         Medications: Current Outpatient Prescriptions  Medication Sig Dispense Refill  . aspirin EC 81 MG tablet Take 1 tablet (81 mg total) by mouth daily. 30 tablet 7  . carvedilol (COREG) 12.5 MG tablet TAKE 1 TABLET BY MOUTH 2 TIMES DAILY WITH A MEAL. 180 tablet 3  . furosemide (LASIX) 20 MG tablet Take 1 tablet (20 mg total) by mouth daily. 90 tablet 3  . lisinopril (PRINIVIL,ZESTRIL) 40 MG tablet TAKE 1 TABLET BY MOUTH 2 TIMES DAILY. 180 tablet 3  . sildenafil (VIAGRA) 50 MG tablet Take 1 tablet (50 mg total) by mouth daily as needed for erectile dysfunction. 30 tablet 3  . simvastatin (ZOCOR) 5 MG tablet Take 1 tablet (5 mg total) by mouth daily. 90 tablet 3  . spironolactone (ALDACTONE) 25 MG tablet Take 0.5 tablets (12.5 mg total) by mouth daily. 45 tablet 3   No current facility-administered medications for this visit.     Allergies: No Known Allergies  Social History: The patient  reports that he quit smoking about 20 years ago. His smoking use included Cigarettes. He does not have any smokeless tobacco history on file. He reports that he does not drink alcohol or use drugs.   Family History: The patient's family history includes Heart attack in his mother; Stroke in his father.   Review  of Systems: Please see the history of present illness.   Otherwise, the review of systems is positive for none.   All other systems are reviewed and negative.   Physical Exam: VS:  BP 140/84   Pulse 60   Ht 5\' 7"  (1.702 m)   Wt 231 lb 12.8 oz (105.1 kg)   SpO2 99% Comment: at rest  BMI 36.31 kg/m  .  BMI Body mass index is 36.31 kg/m.  Wt Readings from Last 3 Encounters:  01/26/16 231 lb 12.8 oz (105.1 kg)  07/20/15 225 lb 12.8 oz (102.4 kg)  04/08/15 226 lb (102.5 kg)    General: Pleasant. Well developed, well nourished and in no acute distress. He has gained 6 pounds.   HEENT: Normal.  Neck: Supple, no JVD, carotid bruits, or masses noted.  Cardiac: Regular rate and rhythm.  No murmurs, rubs, or gallops. No edema.  Respiratory:  Lungs are clear to auscultation bilaterally with normal work of breathing.  GI: Soft and nontender.  MS: No deformity or atrophy. Gait and ROM intact.  Skin: Warm and dry. Color is normal.  Neuro:  Strength and sensation are intact and no gross focal deficits noted.  Psych: Alert, appropriate and with normal affect.   LABORATORY DATA:  EKG:  EKG is not ordered today.  Lab Results  Component Value Date   WBC 5.9 07/20/2015   HGB 13.6 07/20/2015   HCT 40.4 07/20/2015   PLT 89 (L) 07/20/2015   GLUCOSE 217 (H) 10/22/2015   CHOL 114 (L) 07/20/2015   TRIG 161 (H) 07/20/2015   HDL 27 (L) 07/20/2015   LDLCALC 55 07/20/2015   ALT 18 07/20/2015   AST 13 07/20/2015   NA 141 10/22/2015   K 3.9 10/22/2015   CL 104 10/22/2015   CREATININE 1.25 10/22/2015   BUN 19 10/22/2015   CO2 27 10/22/2015   TSH 2.387 03/11/2013   HGBA1C 5.4 03/11/2013    BNP (last 3 results) No results for input(s): BNP in the last 8760 hours.  ProBNP (last 3 results) No results for input(s): PROBNP in the last 8760 hours.   Other Studies Reviewed Today:  Echo Study Conclusions 04/2014  - Left ventricle: The cavity size was normal. Wall thickness was normal. Systolic function was normal. The estimated ejection fraction was in the range of 60% to 65%. Wall motion was normal; there were no regional wall motion abnormalities. - Left atrium: The atrium was mildly dilated. - Pulmonary arteries: Systolic pressure was mildly increased. PA peak pressure: 33 mm Hg (S).  Assessment/Plan:  1. HTN Cardiomyopathy with chronic systolic HF - last echo with normal EF - doing well clinically - I would put him NYHA I/II.  Encouraged him to get back on his walking program.   2. HTN - BP is better at home - he will continue to monitor. No change in his regimen for now.   3. HLD - on low dose statin therapy - rechecking labs today  4. Chronic  thrombocytopenia - recheck CBC today  5. Prior CKD - have had to cut his aldactone back with normalization of his labs - rechecking today.  Current medicines are reviewed with the patient today.  The patient does not have concerns regarding medicines other than what has been noted above.  The following changes have been made:  See above.  Labs/ tests ordered today include:    Orders Placed This Encounter  Procedures  . Basic metabolic panel  . CBC  .  Hepatic function panel  . Lipid panel     Disposition:   FU with me in 6 months.   Patient is agreeable to this plan and will call if any problems develop in the interim.   Signed: Rosalio MacadamiaLori C. Shaniya Tashiro, RN, ANP-C 01/26/2016 8:12 AM  [facility] [address] [place], [place]  [address] Phone: [phone] Fax: [phone]

## 2016-01-26 ENCOUNTER — Ambulatory Visit (INDEPENDENT_AMBULATORY_CARE_PROVIDER_SITE_OTHER): Payer: Commercial Managed Care - HMO | Admitting: Nurse Practitioner

## 2016-01-26 ENCOUNTER — Encounter: Payer: Self-pay | Admitting: Nurse Practitioner

## 2016-01-26 VITALS — BP 140/84 | HR 60 | Ht 67.0 in | Wt 231.8 lb

## 2016-01-26 DIAGNOSIS — E7800 Pure hypercholesterolemia, unspecified: Secondary | ICD-10-CM

## 2016-01-26 DIAGNOSIS — I10 Essential (primary) hypertension: Secondary | ICD-10-CM | POA: Diagnosis not present

## 2016-01-26 DIAGNOSIS — I5022 Chronic systolic (congestive) heart failure: Secondary | ICD-10-CM | POA: Diagnosis not present

## 2016-01-26 LAB — BASIC METABOLIC PANEL
BUN: 22 mg/dL (ref 7–25)
CO2: 28 mmol/L (ref 20–31)
Calcium: 9.3 mg/dL (ref 8.6–10.3)
Chloride: 102 mmol/L (ref 98–110)
Creat: 1.26 mg/dL — ABNORMAL HIGH (ref 0.70–1.25)
Glucose, Bld: 128 mg/dL — ABNORMAL HIGH (ref 65–99)
Potassium: 4.1 mmol/L (ref 3.5–5.3)
Sodium: 139 mmol/L (ref 135–146)

## 2016-01-26 LAB — LIPID PANEL
Cholesterol: 99 mg/dL (ref <200)
HDL: 26 mg/dL — ABNORMAL LOW (ref >40)
LDL Cholesterol: 49 mg/dL (ref <100)
Total CHOL/HDL Ratio: 3.8 Ratio (ref <5.0)
Triglycerides: 118 mg/dL (ref <150)
VLDL: 24 mg/dL (ref <30)

## 2016-01-26 LAB — CBC
HCT: 41.7 % (ref 38.5–50.0)
Hemoglobin: 13.9 g/dL (ref 13.2–17.1)
MCH: 28.1 pg (ref 27.0–33.0)
MCHC: 33.3 g/dL (ref 32.0–36.0)
MCV: 84.2 fL (ref 80.0–100.0)
MPV: 11.4 fL (ref 7.5–12.5)
Platelets: 75 10*3/uL — ABNORMAL LOW (ref 140–400)
RBC: 4.95 MIL/uL (ref 4.20–5.80)
RDW: 15.3 % — ABNORMAL HIGH (ref 11.0–15.0)
WBC: 4.6 10*3/uL (ref 3.8–10.8)

## 2016-01-26 LAB — HEPATIC FUNCTION PANEL
ALT: 16 U/L (ref 9–46)
AST: 15 U/L (ref 10–35)
Albumin: 4.1 g/dL (ref 3.6–5.1)
Alkaline Phosphatase: 64 U/L (ref 40–115)
Bilirubin, Direct: 0.2 mg/dL (ref <=0.2)
Indirect Bilirubin: 0.7 mg/dL (ref 0.2–1.2)
Total Bilirubin: 0.9 mg/dL (ref 0.2–1.2)
Total Protein: 6.7 g/dL (ref 6.1–8.1)

## 2016-01-26 NOTE — Patient Instructions (Addendum)
We will be checking the following labs today - BMET, CBC, HPF and Lipids   Medication Instructions:    Continue with your current medicines.     Testing/Procedures To Be Arranged:  N/A  Follow-Up:   See me in 6 months with fasting labs    Other Special Instructions:   Get back to walking!  Keep a check on your BP for me.     If you need a refill on your cardiac medications before your next appointment, please call your pharmacy.   Call the [facility] office at [phone] if you have any questions, problems or concerns.

## 2016-02-02 MED FILL — SIMVASTATIN 5 MG TABLET: 5 | 30 days supply | Qty: 30 | Fill #5

## 2016-02-29 MED FILL — SIMVASTATIN 5 MG TABLET: 5 | 30 days supply | Qty: 30 | Fill #6

## 2016-04-26 MED FILL — SIMVASTATIN 5 MG TABLET: 5 | 30 days supply | Qty: 30 | Fill #7

## 2016-06-27 MED FILL — SIMVASTATIN 5 MG TABLET: 5 | 30 days supply | Qty: 30 | Fill #8

## 2016-06-28 ENCOUNTER — Encounter: Payer: Self-pay | Admitting: Family Medicine

## 2016-06-29 DIAGNOSIS — Z961 Presence of intraocular lens: Secondary | ICD-10-CM | POA: Diagnosis not present

## 2016-06-29 DIAGNOSIS — H2512 Age-related nuclear cataract, left eye: Secondary | ICD-10-CM | POA: Diagnosis not present

## 2016-07-26 ENCOUNTER — Ambulatory Visit: Payer: Commercial Managed Care - HMO | Admitting: Nurse Practitioner

## 2016-07-26 ENCOUNTER — Ambulatory Visit (INDEPENDENT_AMBULATORY_CARE_PROVIDER_SITE_OTHER): Payer: Medicare HMO | Admitting: Nurse Practitioner

## 2016-07-26 ENCOUNTER — Encounter: Payer: Self-pay | Admitting: Nurse Practitioner

## 2016-07-26 VITALS — BP 128/74 | HR 55 | Ht 67.0 in | Wt 228.4 lb

## 2016-07-26 DIAGNOSIS — I420 Dilated cardiomyopathy: Secondary | ICD-10-CM | POA: Diagnosis not present

## 2016-07-26 DIAGNOSIS — I5022 Chronic systolic (congestive) heart failure: Secondary | ICD-10-CM | POA: Diagnosis not present

## 2016-07-26 DIAGNOSIS — I10 Essential (primary) hypertension: Secondary | ICD-10-CM | POA: Diagnosis not present

## 2016-07-26 DIAGNOSIS — E7800 Pure hypercholesterolemia, unspecified: Secondary | ICD-10-CM | POA: Diagnosis not present

## 2016-07-26 MED ORDER — SIMVASTATIN 5 MG PO TABS: 5.0000 mg | ORAL_TABLET | Freq: Every day | ORAL | 6 refills | Status: DC

## 2016-07-26 MED ORDER — SIMVASTATIN 5 MG PO TABS: 5.0000 mg | ORAL_TABLET | Freq: Every day | ORAL | 3 refills | Status: DC

## 2016-07-26 MED FILL — SIMVASTATIN 5 MG TABLET: 5 | 30 days supply | Qty: 30 | Fill #0

## 2016-07-26 NOTE — Patient Instructions (Addendum)
We will be checking the following labs today - BMET, CBC, HPF and lipids   Medication Instructions:    Continue with your current medicines.   I sent in your refills for your Zocor today    Testing/Procedures To Be Arranged:  N/A  Follow-Up:   See me in 6 months    Other Special Instructions:   N/A    If you need a refill on your cardiac medications before your next appointment, please call your pharmacy.   Call the [facility] office at [phone] if you have any questions, problems or concerns.

## 2016-07-26 NOTE — Progress Notes (Signed)
CARDIOLOGY OFFICE NOTE  Date:  07/26/2016    [name] Date of Birth: 01-25-1951 Medical Record #[id number]  PCP:  Dessa Phi, MD  Cardiologist:  Tyrone Sage     Chief Complaint  Patient presents with  . Cardiomyopathy    Follow up visit     History of Present Illness: BLANCA THORNTON is a 66 y.o. male who presents today for a follow up visit. Former patient of Dr. Henrietta Hoover. Now follows with me.   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 Initially his ejection fraction was noted at 35%. There was no ischemia by past nuclear scan. Cardiac catheterization was never done. He is been very compliant with his medications and has had normalization of left ventricular function - last echo from March of 2016. Other issues as noted below and include chronic thrombocytopenia.   Seen in June of 2017 and was felt to be doing ok. Labs showed worsening CKD. Had to cut back his aldactone. I last saw him back in December and he was doing well.   Comes back today. Here alone today. He feels good. No chest pain. Breathing is good. Not swelling. Not dizzy or lightheaded. He has no real complaints. He does have some chronic bruising - has chronically low platelets and on aspirin. No excessive bleeding. He has not wanted to see hematology - says "no need".   Past Medical History:  Diagnosis Date  . 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  . Chronic systolic CHF (congestive heart failure) (HCC)    a. Echocardiogram (02/05/2013): Mild LVH, EF 25-30%, diffuse HK, mild MR, mild LAE, mild RVE, moderately reduced RVSF, mild RAE, PASP 64, trivial effusion.;  b.  Echo (04/2013): EF 30-35%, diffuse HK, Gr 2 DD, mod LAE, mildly reduced RVSF, mild RAE, PASP 46  . Ejection fraction < 50%   . Hypertension   . Pulmonary HTN (HCC)   . Snoring    needs sleep study  . Thigh shingles  01/04/2015   R L2 dermatome   . Thrombocytopenia (HCC)     Past Surgical History:  Procedure Laterality Date  . none       Medications: Current Outpatient Prescriptions  Medication Sig Dispense Refill  . aspirin EC 81 MG tablet Take 1 tablet (81 mg total) by mouth daily. 30 tablet 7  . carvedilol (COREG) 12.5 MG tablet TAKE 1 TABLET BY MOUTH 2 TIMES DAILY WITH A MEAL. 180 tablet 3  . furosemide (LASIX) 20 MG tablet Take 1 tablet (20 mg total) by mouth daily. 90 tablet 3  . lisinopril (PRINIVIL,ZESTRIL) 40 MG tablet TAKE 1 TABLET BY MOUTH 2 TIMES DAILY. 180 tablet 3  . simvastatin (ZOCOR) 5 MG tablet Take 1 tablet (5 mg total) by mouth daily. 90 tablet 3  . simvastatin (ZOCOR) 5 MG tablet Take 1 tablet (5 mg total) by mouth daily. 30 tablet 6  . spironolactone (ALDACTONE) 25 MG tablet Take 0.5 tablets (12.5 mg total) by mouth daily. 45 tablet 3   No current facility-administered medications for this visit.     Allergies: No Known Allergies  Social History: The patient  reports that he quit smoking about 21 years ago. His smoking use included Cigarettes. He has never used smokeless tobacco. He reports that he does not drink alcohol or use drugs.   Family History: The patient's family history includes Heart attack in  his mother; Stroke in his father.   Review of Systems: Please see the history of present illness.   Otherwise, the review of systems is positive for none.   All other systems are reviewed and negative.   Physical Exam: VS:  BP 128/74 (BP Location: Left Arm, Patient Position: Sitting, Cuff Size: Large)   Pulse (!) 55   Ht 5\' 7"  (1.702 m)   Wt 228 lb 6.4 oz (103.6 kg)   BMI 35.77 kg/m  .  BMI Body mass index is 35.77 kg/m.  Wt Readings from Last 3 Encounters:  07/26/16 228 lb 6.4 oz (103.6 kg)  01/26/16 231 lb 12.8 oz (105.1 kg)  07/20/15 225 lb 12.8 oz (102.4 kg)    General: Pleasant. Well developed, well nourished and in no acute distress. He has lost a  few pounds. Remains obese.   HEENT: Normal.  Neck: Supple, no JVD, carotid bruits, or masses noted.  Cardiac: Regular rate and rhythm. No murmurs, rubs, or gallops. No edema.  Respiratory:  Lungs are clear to auscultation bilaterally with normal work of breathing.  GI: Soft and nontender.  MS: No deformity or atrophy. Gait and ROM intact.  Skin: Warm and dry. Color is normal.  Neuro:  Strength and sensation are intact and no gross focal deficits noted.  Psych: Alert, appropriate and with normal affect.   LABORATORY DATA:  EKG:  EKG is ordered today. This demonstrates sinus bradycardia with RSR'.  Lab Results  Component Value Date   WBC 4.6 01/26/2016   HGB 13.9 01/26/2016   HCT 41.7 01/26/2016   PLT 75 (L) 01/26/2016   GLUCOSE 128 (H) 01/26/2016   CHOL 99 01/26/2016   TRIG 118 01/26/2016   HDL 26 (L) 01/26/2016   LDLCALC 49 01/26/2016   ALT 16 01/26/2016   AST 15 01/26/2016   NA 139 01/26/2016   K 4.1 01/26/2016   CL 102 01/26/2016   CREATININE 1.26 (H) 01/26/2016   BUN 22 01/26/2016   CO2 28 01/26/2016   TSH 2.387 03/11/2013   HGBA1C 5.4 03/11/2013     BNP (last 3 results) No results for input(s): BNP in the last 8760 hours.  ProBNP (last 3 results) No results for input(s): PROBNP in the last 8760 hours.   Other Studies Reviewed Today:  Echo Study Conclusions 04/2014  - Left ventricle: The cavity size was normal. Wall thickness was normal. Systolic function was normal. The estimated ejection fraction was in the range of 60% to 65%.Wall motion was normal; there were no regional wall motion abnormalities. - Left atrium: The atrium was mildly dilated. - Pulmonary arteries: Systolic pressure was mildly increased. PA peak pressure: 33 mm Hg (S).  Assessment/Plan:  1. HTN Cardiomyopathy with chronic systolic HF - last echo with normal EF from 2016 - doing well clinically - I would put him NYHA I/II.  Will keep him on his current regimen.   2. HTN -  BP is ok - he will continue to monitor. No change in his regimen for now.   3. HLD - on low dose statin therapy - rechecking labs today  4. Chronic thrombocytopenia - recheck CBC today - no excessive bleeding reported. At some point may need to see hematology.   5. Prior CKD - have had to cut his aldactone back with normalization of his labs - rechecking today.  Current medicines are reviewed with the patient today.  The patient does not have concerns regarding medicines other than what has been noted  above.  The following changes have been made:  See above.  Labs/ tests ordered today include:    Orders Placed This Encounter  Procedures  . Basic metabolic panel  . CBC  . Hepatic function panel  . Lipid panel  . EKG 12-Lead     Disposition:   FU with me in 6 months.   Patient is agreeable to this plan and will call if any problems develop in the interim.   SignedNorma Fredrickson, NP  07/26/2016 9:11 AM  [facility] [address] [place], [place]  [address] Phone: [phone] Fax: [phone]

## 2016-07-27 ENCOUNTER — Other Ambulatory Visit: Payer: Self-pay | Admitting: Nurse Practitioner

## 2016-07-27 DIAGNOSIS — D691 Qualitative platelet defects: Secondary | ICD-10-CM

## 2016-07-27 LAB — CBC
Hematocrit: 40.4 % (ref 37.5–51.0)
Hemoglobin: 13.8 g/dL (ref 13.0–17.7)
MCH: 28.9 pg (ref 26.6–33.0)
MCHC: 34.2 g/dL (ref 31.5–35.7)
MCV: 85 fL (ref 79–97)
RBC: 4.77 x10E6/uL (ref 4.14–5.80)
RDW: 14.9 % (ref 12.3–15.4)
WBC: 5 10*3/uL (ref 3.4–10.8)

## 2016-07-27 LAB — LIPID PANEL
Chol/HDL Ratio: 3.4 ratio (ref 0.0–5.0)
Cholesterol, Total: 106 mg/dL (ref 100–199)
HDL: 31 mg/dL — ABNORMAL LOW (ref >39)
LDL Calculated: 47 mg/dL (ref 0–99)
Triglycerides: 141 mg/dL (ref 0–149)
VLDL Cholesterol Cal: 28 mg/dL (ref 5–40)

## 2016-07-27 LAB — BASIC METABOLIC PANEL
BUN/Creatinine Ratio: 15 (ref 10–24)
BUN: 17 mg/dL (ref 8–27)
CO2: 26 mmol/L (ref 20–29)
Calcium: 9.2 mg/dL (ref 8.6–10.2)
Chloride: 96 mmol/L (ref 96–106)
Creatinine, Ser: 1.13 mg/dL (ref 0.76–1.27)
GFR calc Af Amer: 78 mL/min/{1.73_m2} (ref >59)
GFR calc non Af Amer: 68 mL/min/{1.73_m2} (ref >59)
Glucose: 132 mg/dL — ABNORMAL HIGH (ref 65–99)
Potassium: 4 mmol/L (ref 3.5–5.2)
Sodium: 137 mmol/L (ref 134–144)

## 2016-07-27 LAB — HEPATIC FUNCTION PANEL
ALT: 18 IU/L (ref 0–44)
AST: 17 IU/L (ref 0–40)
Albumin: 4.2 g/dL (ref 3.6–4.8)
Alkaline Phosphatase: 74 IU/L (ref 39–117)
Bilirubin Total: 0.8 mg/dL (ref 0.0–1.2)
Bilirubin, Direct: 0.22 mg/dL (ref 0.00–0.40)
Total Protein: 6.7 g/dL (ref 6.0–8.5)

## 2016-07-28 ENCOUNTER — Other Ambulatory Visit: Payer: Medicare HMO

## 2016-07-28 DIAGNOSIS — D691 Qualitative platelet defects: Secondary | ICD-10-CM

## 2016-07-29 LAB — CBC WITH DIFFERENTIAL/PLATELET
Basophils Absolute: 0 10*3/uL (ref 0.0–0.2)
Basos: 0 %
EOS (ABSOLUTE): 0 10*3/uL (ref 0.0–0.4)
Eos: 1 %
Hematocrit: 40.2 % (ref 37.5–51.0)
Hemoglobin: 13.7 g/dL (ref 13.0–17.7)
Immature Grans (Abs): 0 10*3/uL (ref 0.0–0.1)
Immature Granulocytes: 0 %
Lymphocytes Absolute: 0.6 10*3/uL — ABNORMAL LOW (ref 0.7–3.1)
Lymphs: 14 %
MCH: 29 pg (ref 26.6–33.0)
MCHC: 34.1 g/dL (ref 31.5–35.7)
MCV: 85 fL (ref 79–97)
Monocytes Absolute: 0.4 10*3/uL (ref 0.1–0.9)
Monocytes: 8 %
Neutrophils Absolute: 3.6 10*3/uL (ref 1.4–7.0)
Neutrophils: 77 %
Platelets: 79 10*3/uL — CL (ref 150–379)
RBC: 4.73 x10E6/uL (ref 4.14–5.80)
RDW: 15 % (ref 12.3–15.4)
WBC: 4.7 10*3/uL (ref 3.4–10.8)

## 2016-07-31 ENCOUNTER — Other Ambulatory Visit: Payer: Self-pay | Admitting: Nurse Practitioner

## 2016-07-31 DIAGNOSIS — Z79899 Other long term (current) drug therapy: Secondary | ICD-10-CM

## 2016-07-31 DIAGNOSIS — R7989 Other specified abnormal findings of blood chemistry: Secondary | ICD-10-CM

## 2016-07-31 DIAGNOSIS — R799 Abnormal finding of blood chemistry, unspecified: Secondary | ICD-10-CM

## 2017-01-24 ENCOUNTER — Ambulatory Visit: Payer: Medicare HMO | Admitting: Nurse Practitioner

## 2017-01-24 ENCOUNTER — Encounter: Payer: Self-pay | Admitting: Nurse Practitioner

## 2017-01-24 ENCOUNTER — Telehealth: Payer: Self-pay | Admitting: *Deleted

## 2017-01-24 ENCOUNTER — Encounter (INDEPENDENT_AMBULATORY_CARE_PROVIDER_SITE_OTHER): Payer: Self-pay

## 2017-01-24 VITALS — BP 144/82 | HR 63 | Ht 67.0 in | Wt 233.8 lb

## 2017-01-24 DIAGNOSIS — I5022 Chronic systolic (congestive) heart failure: Secondary | ICD-10-CM

## 2017-01-24 DIAGNOSIS — E7800 Pure hypercholesterolemia, unspecified: Secondary | ICD-10-CM | POA: Diagnosis not present

## 2017-01-24 DIAGNOSIS — I10 Essential (primary) hypertension: Secondary | ICD-10-CM

## 2017-01-24 DIAGNOSIS — Z79899 Other long term (current) drug therapy: Secondary | ICD-10-CM | POA: Diagnosis not present

## 2017-01-24 LAB — CBC
Hematocrit: 43.4 % (ref 37.5–51.0)
Hemoglobin: 15.1 g/dL (ref 13.0–17.7)
MCH: 29 pg (ref 26.6–33.0)
MCHC: 34.8 g/dL (ref 31.5–35.7)
MCV: 83 fL (ref 79–97)
Platelets: 106 10*3/uL — ABNORMAL LOW (ref 150–379)
RBC: 5.21 x10E6/uL (ref 4.14–5.80)
RDW: 15 % (ref 12.3–15.4)
WBC: 5.6 10*3/uL (ref 3.4–10.8)

## 2017-01-24 LAB — LIPID PANEL
Chol/HDL Ratio: 3.7 ratio (ref 0.0–5.0)
Cholesterol, Total: 114 mg/dL (ref 100–199)
HDL: 31 mg/dL — ABNORMAL LOW (ref >39)
LDL Calculated: 59 mg/dL (ref 0–99)
Triglycerides: 122 mg/dL (ref 0–149)
VLDL Cholesterol Cal: 24 mg/dL (ref 5–40)

## 2017-01-24 LAB — BASIC METABOLIC PANEL
BUN/Creatinine Ratio: 12 (ref 10–24)
BUN: 15 mg/dL (ref 8–27)
CO2: 28 mmol/L (ref 20–29)
Calcium: 9.9 mg/dL (ref 8.6–10.2)
Chloride: 97 mmol/L (ref 96–106)
Creatinine, Ser: 1.24 mg/dL (ref 0.76–1.27)
GFR calc Af Amer: 70 mL/min/{1.73_m2} (ref >59)
GFR calc non Af Amer: 60 mL/min/{1.73_m2} (ref >59)
Glucose: 164 mg/dL — ABNORMAL HIGH (ref 65–99)
Potassium: 5.1 mmol/L (ref 3.5–5.2)
Sodium: 139 mmol/L (ref 134–144)

## 2017-01-24 LAB — HEPATIC FUNCTION PANEL
ALT: 21 IU/L (ref 0–44)
AST: 17 IU/L (ref 0–40)
Albumin: 4.4 g/dL (ref 3.6–4.8)
Alkaline Phosphatase: 83 IU/L (ref 39–117)
Bilirubin Total: 0.9 mg/dL (ref 0.0–1.2)
Bilirubin, Direct: 0.26 mg/dL (ref 0.00–0.40)
Total Protein: 7.2 g/dL (ref 6.0–8.5)

## 2017-01-24 NOTE — Progress Notes (Signed)
CARDIOLOGY OFFICE NOTE  Date:  01/24/2017    [name] Date of Birth: 1950/06/29 Medical Record #[id number]  PCP:  Dessa Phi, MD  Cardiologist:  Tyrone Sage     Chief Complaint  Patient presents with  . Cardiomyopathy    6 month check     History of Present Illness: Jesse Torres is a 66 y.o. male who presents today for a 6 month check. Former patient of Dr. Henrietta Hoover. Now follows with me.   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 Initially his ejection fraction was noted at 35%. There was no ischemia by past nuclear scan. Cardiac catheterization was never done. He is been very compliant with his medications and has had normalization of left ventricular function - last echo from March of 2016. Other issues as noted below and include chronic thrombocytopenia. He has declined referral to hematology.   Seen in June of 2017 and was felt to be doing ok.Labs showed worsening CKD. Had to cut back his aldactone. I last saw him back in June and he was doing well.   Comes back today. Here alone today. he continues to do well. He is not short of breath. No swelling. No PND/orthopnea. No chest pain. Admits he has stopped walking due to the weather. Weight is up. BP is good at home - he checks BP regularly at [facility]. He feels like he is doing well and really has no complaints today. Tolerating his medicines well. Does have chronic bruising - still does not feel like he needs to see hematology.   Past Medical History:  Diagnosis Date  . 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  . Chronic systolic CHF (congestive heart failure) (HCC)    a. Echocardiogram (02/05/2013): Mild LVH, EF 25-30%, diffuse HK, mild MR, mild LAE, mild RVE, moderately reduced RVSF, mild RAE, PASP 64, trivial effusion.;  b.  Echo (04/2013): EF 30-35%, diffuse HK, Gr 2 DD, mod LAE, mildly reduced  RVSF, mild RAE, PASP 46  . Ejection fraction < 50%   . Hypertension   . Pulmonary HTN (HCC)   . Snoring    needs sleep study  . Thigh shingles 01/04/2015   R L2 dermatome   . Thrombocytopenia (HCC)     Past Surgical History:  Procedure Laterality Date  . none       Medications: Current Meds  Medication Sig  . aspirin EC 81 MG tablet Take 1 tablet (81 mg total) by mouth daily.  . carvedilol (COREG) 12.5 MG tablet Take 1 tablet (12.5 mg total) by mouth 2 (two) times daily with a meal.  . furosemide (LASIX) 20 MG tablet Take 1 tablet (20 mg total) by mouth daily.  Marland Kitchen lisinopril (PRINIVIL,ZESTRIL) 40 MG tablet Take 1 tablet (40 mg total) by mouth 2 (two) times daily.  . simvastatin (ZOCOR) 5 MG tablet Take 1 tablet (5 mg total) by mouth daily.  Marland Kitchen spironolactone (ALDACTONE) 25 MG tablet Take 0.5 tablets (12.5 mg total) by mouth daily.     Allergies: No Known Allergies  Social History: The patient  reports that he quit smoking about 21 years ago. His smoking use included cigarettes. he has never used smokeless tobacco. He reports that he does not drink alcohol or use drugs.   Family History: The patient's family history includes Heart attack in his mother; Stroke in his father.   Review of  Systems: Please see the history of present illness.   Otherwise, the review of systems is positive for none.   All other systems are reviewed and negative.   Physical Exam: VS:  BP (!) 144/82 (BP Location: Left Arm, Patient Position: Sitting, Cuff Size: Normal)   Pulse 63   Ht 5\' 7"  (1.702 m)   Wt 233 lb 12.8 oz (106.1 kg)   SpO2 99% Comment: at rest  BMI 36.62 kg/m  .  BMI Body mass index is 36.62 kg/m.  Wt Readings from Last 3 Encounters:  01/24/17 233 lb 12.8 oz (106.1 kg)  07/26/16 228 lb 6.4 oz (103.6 kg)  01/26/16 231 lb 12.8 oz (105.1 kg)    General: Pleasant. Elderly male. He is alert and in no acute distress.  His weight is up 5 pounds since last visit - but up 2 since  last year.  HEENT: Normal.  Neck: Supple, no JVD, carotid bruits, or masses noted.  Cardiac: Regular rate and rhythm. No murmurs, rubs, or gallops. No edema.  Respiratory:  Lungs are clear to auscultation bilaterally with normal work of breathing.  GI: Soft and nontender.  MS: No deformity or atrophy. Gait and ROM intact.  Skin: Warm and dry. Color is normal. Skin is dry. Some bruising.  Neuro:  Strength and sensation are intact and no gross focal deficits noted.  Psych: Alert, appropriate and with normal affect.   LABORATORY DATA:  EKG:  EKG is not ordered today.   Lab Results  Component Value Date   WBC 4.7 07/28/2016   HGB 13.7 07/28/2016   HCT 40.2 07/28/2016   PLT 79 (LL) 07/28/2016   GLUCOSE 132 (H) 07/26/2016   CHOL 106 07/26/2016   TRIG 141 07/26/2016   HDL 31 (L) 07/26/2016   LDLCALC 47 07/26/2016   ALT 18 07/26/2016   AST 17 07/26/2016   NA 137 07/26/2016   K 4.0 07/26/2016   CL 96 07/26/2016   CREATININE 1.13 07/26/2016   BUN 17 07/26/2016   CO2 26 07/26/2016   TSH 2.387 03/11/2013   HGBA1C 5.4 03/11/2013     BNP (last 3 results) No results for input(s): BNP in the last 8760 hours.  ProBNP (last 3 results) No results for input(s): PROBNP in the last 8760 hours.   Other Studies Reviewed Today:  Echo Study Conclusions 04/2014  - Left ventricle: The cavity size was normal. Wall thickness was normal. Systolic function was normal. The estimated ejection fraction was in the range of 60% to 65%.Wall motion was normal; there were no regional wall motion abnormalities. - Left atrium: The atrium was mildly dilated. - Pulmonary arteries: Systolic pressure was mildly increased. PA peak pressure: 33 mm Hg (S).  Assessment/Plan:  1. Hypertensive cardiomyopathy with chronic systolic HF - last echo with normal EF from 2016 - he continues to do well. I cautioned him about weight gain. I would still classify him NYHA I/II. Will keep him on his current  regimen. No changes made today.   2. HTN - BP is fair - he has better control at home. He will continue to monitor.    3. HLD - lab today. Remains on low dose statin therapy  4. Chronic thrombocytopenia - CBC today. No active bleeding. Has declined hematology referral.   5. Prior CKD - rechecking lab today.   Current medicines are reviewed with the patient today.  The patient does not have concerns regarding medicines other than what has been noted above.  The  following changes have been made:  See above.  Labs/ tests ordered today include:    Orders Placed This Encounter  Procedures  . Basic metabolic panel  . CBC  . Hepatic function panel  . Lipid panel     Disposition:   FU with me in 6 months with EKG.    Patient is agreeable to this plan and will call if any problems develop in the interim.   SignedNorma Fredrickson, NP  01/24/2017 10:14 AM  [facility] [address] [place], [place]  [address] Phone: [phone] Fax: [phone]

## 2017-01-24 NOTE — Patient Instructions (Addendum)
We will be checking the following labs today - BMET, CBC, HPF, Lipids   Medication Instructions:    Continue with your current medicines.     Testing/Procedures To Be Arranged:  N/A  Follow-Up:   See me in 6 months    Other Special Instructions:   Get back to walking and working on your weight    If you need a refill on your cardiac medications before your next appointment, please call your pharmacy.   Call the [facility] office at [phone] if you have any questions, problems or concerns.

## 2017-01-24 NOTE — Telephone Encounter (Signed)
error 

## 2017-05-02 ENCOUNTER — Telehealth: Payer: Self-pay | Admitting: Nurse Practitioner

## 2017-05-02 NOTE — Telephone Encounter (Signed)
S/w pt has a cold and does not have appointment with community wellness till April.  Plain Mucinex and Plain Robitussin was recommended.  Pt is producing white phlegm.

## 2017-05-02 NOTE — Telephone Encounter (Signed)
New message    Pt came in today and would like a call about being sent in medication for a cold. He said that he went to primary doctor and he hasn't been there in 2 years and they couldn't see him until April. Please call.

## 2017-05-14 ENCOUNTER — Other Ambulatory Visit: Payer: Self-pay | Admitting: Nurse Practitioner

## 2017-05-14 DIAGNOSIS — R799 Abnormal finding of blood chemistry, unspecified: Secondary | ICD-10-CM

## 2017-05-14 DIAGNOSIS — R7989 Other specified abnormal findings of blood chemistry: Secondary | ICD-10-CM

## 2017-05-14 DIAGNOSIS — Z79899 Other long term (current) drug therapy: Secondary | ICD-10-CM

## 2017-07-24 ENCOUNTER — Ambulatory Visit: Payer: Medicare HMO | Admitting: Nurse Practitioner

## 2017-07-24 ENCOUNTER — Encounter: Payer: Self-pay | Admitting: Nurse Practitioner

## 2017-07-24 ENCOUNTER — Encounter (INDEPENDENT_AMBULATORY_CARE_PROVIDER_SITE_OTHER): Payer: Self-pay

## 2017-07-24 VITALS — BP 160/86 | HR 55 | Ht 67.0 in | Wt 233.8 lb

## 2017-07-24 DIAGNOSIS — I5022 Chronic systolic (congestive) heart failure: Secondary | ICD-10-CM

## 2017-07-24 DIAGNOSIS — E7800 Pure hypercholesterolemia, unspecified: Secondary | ICD-10-CM | POA: Diagnosis not present

## 2017-07-24 NOTE — Progress Notes (Signed)
CARDIOLOGY OFFICE NOTE  Date:  07/24/2017    Jesse Torres Date of Birth: [birth year] Medical Record #[id number]  PCP:  Jesse Phi, MD  Cardiologist:  Jesse Torres     Chief Complaint  Patient presents with  . Congestive Heart Failure    6 month check     History of Present Illness: Jesse Torres is a 67 y.o. male who presents today for a follow up visit. Former patient of Dr. Henrietta Torres.Now follows with me.  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 Initially his ejection fraction was noted at 35%. There was no ischemia by past nuclear scan. Cardiac catheterization was never done. He is been very compliant with his medications and has had normalization of left ventricular function - last echo from March of 2016.Other issues as noted below and include chronic thrombocytopenia.He has declined referral to hematology.   Seen in [place] 2017and was felt to be doing ok.Labs showed worsening CKD. Had to cut back his aldactone.I last saw him back in December of 2018 and he was doing well.  Comes back today. Here alone.  He is going to change his PCP. Trying to get to the [facility] office - it is closer to him. He forgot his medicines last night - that is pretty unusual for him - he is typically very compliant. He has had prior cold about a month ago - this has resolved with OTC aids. Not short of breath. No swelling. Weight is unchanged. He feels like he is doing well. Not dizzy or lightheaded.   Past Medical History:  Diagnosis Date  . 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  . Chronic systolic CHF (congestive heart failure) (HCC)    a. Echocardiogram (02/05/2013): Mild LVH, EF 25-30%, diffuse HK, mild MR, mild LAE, mild RVE, moderately reduced RVSF, mild RAE, PASP 64, trivial effusion.;  b.  Echo (04/2013): EF 30-35%, diffuse HK, Gr 2 DD, mod LAE, mildly  reduced RVSF, mild RAE, PASP 46  . Ejection fraction < 50%   . Hypertension   . Pulmonary HTN (HCC)   . Snoring    needs sleep study  . Thigh shingles 01/04/2015   R L2 dermatome   . Thrombocytopenia (HCC)     Past Surgical History:  Procedure Laterality Date  . none       Medications: Current Meds  Medication Sig  . aspirin EC 81 MG tablet Take 1 tablet (81 mg total) by mouth daily.  . carvedilol (COREG) 12.5 MG tablet TAKE 1 TABLET (12.5 MG TOTAL) BY MOUTH 2 (TWO) TIMES DAILY WITH A MEAL.  . furosemide (LASIX) 20 MG tablet TAKE 1 TABLET (20 MG TOTAL) BY MOUTH DAILY.  Marland Kitchen lisinopril (PRINIVIL,ZESTRIL) 40 MG tablet TAKE 1 TABLET (40 MG TOTAL) BY MOUTH 2 (TWO) TIMES DAILY.  . simvastatin (ZOCOR) 5 MG tablet Take 1 tablet (5 mg total) by mouth daily.  Marland Kitchen spironolactone (ALDACTONE) 25 MG tablet Take 12.5 mg by mouth daily.  . [DISCONTINUED] spironolactone (ALDACTONE) 25 MG tablet TAKE 1/2 TABLET EVERY DAY (Patient taking differently: TAKE 1/2 TABLET BY MOUTH EVERY DAY)     Allergies: No Known Allergies  Social History: The patient  reports that he quit smoking about 22 years ago. His smoking use included cigarettes. He has never used smokeless tobacco. He reports that he does not drink alcohol or use drugs.   Family History: The  patient's family history includes Heart attack in his mother; Stroke in his father.   Review of Systems: Please see the history of present illness.   Otherwise, the review of systems is positive for none.   All other systems are reviewed and negative.   Physical Exam: VS:  BP (!) 160/86 (BP Location: Left Arm, Patient Position: Sitting, Cuff Size: Normal)   Pulse (!) 55   Ht 5\' 7"  (1.702 m)   Wt 233 lb 12.8 oz (106.1 kg)   BMI 36.62 kg/m  .  BMI Body mass index is 36.62 kg/m.  Wt Readings from Last 3 Encounters:  07/24/17 233 lb 12.8 oz (106.1 kg)  01/24/17 233 lb 12.8 oz (106.1 kg)  07/26/16 228 lb 6.4 oz (103.6 kg)    General: Pleasant.  Alert and in no acute distress.   HEENT: Normal.  Neck: Supple, no JVD, carotid bruits, or masses noted.  Cardiac: Regular rate and rhythm. No murmurs, rubs, or gallops. No edema.  Respiratory:  Lungs are clear to auscultation bilaterally with normal work of breathing.  GI: Soft and nontender.  MS: No deformity or atrophy. Gait and ROM intact.  Skin: Warm and dry. Color is normal.  Neuro:  Strength and sensation are intact and no gross focal deficits noted.  Psych: Alert, appropriate and with normal affect.   LABORATORY DATA:  EKG:  EKG is ordered today. This demonstrates sinus brady - HR is 55.  Lab Results  Component Value Date   WBC 5.6 01/24/2017   HGB 15.1 01/24/2017   HCT 43.4 01/24/2017   PLT 106 (L) 01/24/2017   GLUCOSE 164 (H) 01/24/2017   CHOL 114 01/24/2017   TRIG 122 01/24/2017   HDL 31 (L) 01/24/2017   LDLCALC 59 01/24/2017   ALT 21 01/24/2017   AST 17 01/24/2017   NA 139 01/24/2017   K 5.1 01/24/2017   CL 97 01/24/2017   CREATININE 1.24 01/24/2017   BUN 15 01/24/2017   CO2 28 01/24/2017   TSH 2.387 03/11/2013   HGBA1C 5.4 03/11/2013     BNP (last 3 results) No results for input(s): BNP in the last 8760 hours.  ProBNP (last 3 results) No results for input(s): PROBNP in the last 8760 hours.   Other Studies Reviewed Today:  Echo Study Conclusions 04/2014  - Left ventricle: The cavity size was normal. Wall thickness was normal. Systolic function was normal. The estimated ejection fraction was in the range of 60% to 65%.Wall motion was normal; there were no regional wall motion abnormalities. - Left atrium: The atrium was mildly dilated. - Pulmonary arteries: Systolic pressure was mildly increased. PA peak pressure: 33 mm Hg (S).  Assessment/Plan:  1. Hypertensive cardiomyopathy with chronic systolic HF - his last echo from 2016 was stable with normal EF - remains NYHA I/II. He typically is very compliant with his medicines. No changes  made today.   2. HTN - BP is up here but missed his medicines yesterday - will resume today. He has better BP control at home usually. No changes for now and I have asked him to continue to monitor.   3. HLD - on low dose statin - lab today.   4. Chronic thrombocytopenia - no bleeding reported. Getting CBC today - has refused prior referral to hematology.   5. Prior CKD - rechecking lab today.   6. Obesity - he is trying to work more on losing weight.  Current medicines are reviewed with the patient today.  The patient does not have concerns regarding medicines other than what has been noted above.  The following changes have been made:  See above.  Labs/ tests ordered today include:    Orders Placed This Encounter  Procedures  . Basic metabolic panel  . CBC  . Hepatic function panel  . Lipid panel  . TSH  . EKG 12-Lead     Disposition:   FU with me in 6 months.   Patient is agreeable to this plan and will call if any problems develop in the interim.   SignedNorma Fredrickson, NP  07/24/2017 10:17 AM  [facility] [address] [place], [place]  [address] Phone: [phone] Fax: [phone]

## 2017-07-24 NOTE — Patient Instructions (Addendum)
We will be checking the following labs today - BMET, CBC, HPF, Lipids and TSh   Medication Instructions:    Continue with your current medicines.     Testing/Procedures To Be Arranged:  N/A  Follow-Up:   See me in 6 months    Other Special Instructions:   N/A    If you need a refill on your cardiac medications before your next appointment, please call your pharmacy.   Call the [facility] office at [phone] if you have any questions, problems or concerns.

## 2017-07-25 LAB — HEPATIC FUNCTION PANEL
ALT: 22 IU/L (ref 0–44)
AST: 19 IU/L (ref 0–40)
Albumin: 4.3 g/dL (ref 3.6–4.8)
Alkaline Phosphatase: 80 IU/L (ref 39–117)
Bilirubin Total: 1.1 mg/dL (ref 0.0–1.2)
Bilirubin, Direct: 0.34 mg/dL (ref 0.00–0.40)
Total Protein: 6.8 g/dL (ref 6.0–8.5)

## 2017-07-25 LAB — BASIC METABOLIC PANEL
BUN/Creatinine Ratio: 12 (ref 10–24)
BUN: 14 mg/dL (ref 8–27)
CO2: 28 mmol/L (ref 20–29)
Calcium: 9.6 mg/dL (ref 8.6–10.2)
Chloride: 97 mmol/L (ref 96–106)
Creatinine, Ser: 1.18 mg/dL (ref 0.76–1.27)
GFR calc Af Amer: 74 mL/min/{1.73_m2} (ref >59)
GFR calc non Af Amer: 64 mL/min/{1.73_m2} (ref >59)
Glucose: 142 mg/dL — ABNORMAL HIGH (ref 65–99)
Potassium: 5 mmol/L (ref 3.5–5.2)
Sodium: 138 mmol/L (ref 134–144)

## 2017-07-25 LAB — LIPID PANEL
Chol/HDL Ratio: 3.4 ratio (ref 0.0–5.0)
Cholesterol, Total: 110 mg/dL (ref 100–199)
HDL: 32 mg/dL — ABNORMAL LOW (ref >39)
LDL Calculated: 55 mg/dL (ref 0–99)
Triglycerides: 114 mg/dL (ref 0–149)
VLDL Cholesterol Cal: 23 mg/dL (ref 5–40)

## 2017-07-25 LAB — CBC
Hematocrit: 39.5 % (ref 37.5–51.0)
Hemoglobin: 13.7 g/dL (ref 13.0–17.7)
MCH: 28.1 pg (ref 26.6–33.0)
MCHC: 34.7 g/dL (ref 31.5–35.7)
MCV: 81 fL (ref 79–97)
Platelets: 89 10*3/uL — CL (ref 150–450)
RBC: 4.87 x10E6/uL (ref 4.14–5.80)
RDW: 15.3 % (ref 12.3–15.4)
WBC: 4.1 10*3/uL (ref 3.4–10.8)

## 2017-07-25 LAB — TSH: TSH: 3.54 u[IU]/mL (ref 0.450–4.500)

## 2018-01-21 NOTE — Progress Notes (Signed)
CARDIOLOGY OFFICE NOTE  Date:  01/22/2018    Jesse Torres Date of Birth: 06-23-1950 Medical Record #[id number]  PCP:  Dessa Phi, MD  Cardiologist:  Tyrone Sage &     Chief Complaint  Patient presents with  . Cardiomyopathy    Follow up visit.     History of Present Illness: Jesse Torres is a 67 y.o. male who presents today for a follow up visit. Former patient of Dr. Henrietta Hoover.Now follows with me.  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 Initially his ejection fraction was noted at 35%. There was no ischemia by past nuclear scan. Cardiac catheterization was never done. He is been very compliant with his medications and has had normalization of left ventricular function - last echo from March of 2016.Other issues as noted below and include chronic thrombocytopenia.He has declined referral to hematology.  Seen in [place] 2017and was felt to be doing ok.Labs showed worsening CKD. Had to cut back his aldactone.I last saw him back inJune and he was doing well - was trying to change PCP due to location/travel.   Comes back today. Here alone.  He feels like he is doing well. Still trying to change to different PCP. Not short of breath unless he has to walk a pretty good way but typically has none. No chest pain. BP is ok. Not dizzy. Compliant with his medicines. Overall, he feels like he is doing well.   Past Medical History:  Diagnosis Date  . 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  . Chronic systolic CHF (congestive heart failure) (HCC)    a. Echocardiogram (02/05/2013): Mild LVH, EF 25-30%, diffuse HK, mild MR, mild LAE, mild RVE, moderately reduced RVSF, mild RAE, PASP 64, trivial effusion.;  b.  Echo (04/2013): EF 30-35%, diffuse HK, Gr 2 DD, mod LAE, mildly reduced RVSF, mild RAE, PASP 46  . Ejection fraction < 50%   . Hypertension   .  Pulmonary HTN (HCC)   . Snoring    needs sleep study  . Thigh shingles 01/04/2015   R L2 dermatome   . Thrombocytopenia (HCC)     Past Surgical History:  Procedure Laterality Date  . none       Medications: Current Meds  Medication Sig  . aspirin EC 81 MG tablet Take 1 tablet (81 mg total) by mouth daily.  . carvedilol (COREG) 12.5 MG tablet TAKE 1 TABLET (12.5 MG TOTAL) BY MOUTH 2 (TWO) TIMES DAILY WITH A MEAL.  . furosemide (LASIX) 20 MG tablet TAKE 1 TABLET (20 MG TOTAL) BY MOUTH DAILY.  Marland Kitchen lisinopril (PRINIVIL,ZESTRIL) 40 MG tablet TAKE 1 TABLET (40 MG TOTAL) BY MOUTH 2 (TWO) TIMES DAILY.  . simvastatin (ZOCOR) 5 MG tablet Take 1 tablet (5 mg total) by mouth daily.  Marland Kitchen spironolactone (ALDACTONE) 25 MG tablet Take 12.5 mg by mouth daily.     Allergies: No Known Allergies  Social History: The patient  reports that he quit smoking about 22 years ago. His smoking use included cigarettes. He has never used smokeless tobacco. He reports that he does not drink alcohol or use drugs.   Family History: The patient's family history includes Heart attack in his mother; Stroke in his father.   Review of Systems: Please see the history of present illness.   Otherwise, the review of systems is positive for none.   All other systems are  reviewed and negative.   Physical Exam: VS:  BP 130/76   Pulse 60   Ht 5\' 7"  (1.702 m)   Wt 233 lb (105.7 kg)   SpO2 99%   BMI 36.49 kg/m  .  BMI Body mass index is 36.49 kg/m.  Wt Readings from Last 3 Encounters:  01/22/18 233 lb (105.7 kg)  07/24/17 233 lb 12.8 oz (106.1 kg)  01/24/17 233 lb 12.8 oz (106.1 kg)    General: Pleasant. Obese. Alert and in no acute distress.   HEENT: Normal.  Neck: Supple, no JVD, carotid bruits, or masses noted.  Cardiac: Regular rate and rhythm. No murmurs, rubs, or gallops. No edema.  Respiratory:  Lungs are clear to auscultation bilaterally with normal work of breathing.  GI: Soft and nontender.  MS: No  deformity or atrophy. Gait and ROM intact.  Skin: Warm and dry. Color is normal.  Neuro:  Strength and sensation are intact and no gross focal deficits noted.  Psych: Alert, appropriate and with normal affect.   LABORATORY DATA:  EKG:  EKG is not ordered today.  Lab Results  Component Value Date   WBC 4.1 07/24/2017   HGB 13.7 07/24/2017   HCT 39.5 07/24/2017   PLT 89 (LL) 07/24/2017   GLUCOSE 142 (H) 07/24/2017   CHOL 110 07/24/2017   TRIG 114 07/24/2017   HDL 32 (L) 07/24/2017   LDLCALC 55 07/24/2017   ALT 22 07/24/2017   AST 19 07/24/2017   NA 138 07/24/2017   K 5.0 07/24/2017   CL 97 07/24/2017   CREATININE 1.18 07/24/2017   BUN 14 07/24/2017   CO2 28 07/24/2017   TSH 3.540 07/24/2017   HGBA1C 5.4 03/11/2013     BNP (last 3 results) No results for input(s): BNP in the last 8760 hours.  ProBNP (last 3 results) No results for input(s): PROBNP in the last 8760 hours.   Other Studies Reviewed Today:  Echo Study Conclusions 04/2014  - Left ventricle: The cavity size was normal. Wall thickness was normal. Systolic function was normal. The estimated ejection fraction was in the range of 60% to 65%.Wall motion was normal; there were no regional wall motion abnormalities. - Left atrium: The atrium was mildly dilated. - Pulmonary arteries: Systolic pressure was mildly increased. PA peak pressure: 33 mm Hg (S).  Assessment/Plan:  1. Hypertensive cardiomyopathy with chronic systolic HF - his last echo from 2016 was stable with normal EF - remains NYHA I/II. He looks good clinically. No symptoms. I have left him on his current regimen.   2. HTN -BP fine here today. No changes made today.   3. HLD - remains on low dose statin - lab today.   4. Chronic thrombocytopenia -no bleeding reported. Rechecking his lab - he has refused referral to hematology.    5. Prior CKD -rechecking lab today.  6. Obesity - weight is unchanged - encouraged to try  and eat a little less and move a little more. .  Current medicines are reviewed with the patient today.  The patient does not have concerns regarding medicines other than what has been noted above.  The following changes have been made:  See above.  Labs/ tests ordered today include:    Orders Placed This Encounter  Procedures  . Basic metabolic panel  . CBC  . Hepatic function panel  . Lipid panel     Disposition:   FU with me in 6 months.   Patient is agreeable to this  plan and will call if any problems develop in the interim.   SignedNorma Fredrickson, NP  01/22/2018 10:10 AM  [facility] [address] [place], [place]  [address] Phone: [phone] Fax: [phone]

## 2018-01-22 ENCOUNTER — Encounter: Payer: Self-pay | Admitting: Nurse Practitioner

## 2018-01-22 ENCOUNTER — Ambulatory Visit: Payer: Medicare HMO | Admitting: Nurse Practitioner

## 2018-01-22 VITALS — BP 130/76 | HR 60 | Ht 67.0 in | Wt 233.0 lb

## 2018-01-22 DIAGNOSIS — E7800 Pure hypercholesterolemia, unspecified: Secondary | ICD-10-CM | POA: Diagnosis not present

## 2018-01-22 DIAGNOSIS — I10 Essential (primary) hypertension: Secondary | ICD-10-CM | POA: Diagnosis not present

## 2018-01-22 DIAGNOSIS — D696 Thrombocytopenia, unspecified: Secondary | ICD-10-CM | POA: Diagnosis not present

## 2018-01-22 DIAGNOSIS — Z79899 Other long term (current) drug therapy: Secondary | ICD-10-CM

## 2018-01-22 DIAGNOSIS — I420 Dilated cardiomyopathy: Secondary | ICD-10-CM | POA: Diagnosis not present

## 2018-01-22 NOTE — Patient Instructions (Addendum)
We will be checking the following labs today - BMET, CBC, HPF, Lipids   Medication Instructions:    Continue with your current medicines.    If you need a refill on your cardiac medications before your next appointment, please call your pharmacy.     Testing/Procedures To Be Arranged:  N/A  Follow-Up:   See me in about 6 months    At [facility], you and your health needs are our priority.  As part of our continuing mission to provide you with exceptional heart care, we have created designated Provider Care Teams.  These Care Teams include your primary Cardiologist (physician) and Advanced Practice Providers (APPs -  Physician Assistants and Nurse Practitioners) who all work together to provide you with the care you need, when you need it.  Special Instructions:  . None  Call the [facility] office at [phone] if you have any questions, problems or concerns.

## 2018-01-23 LAB — BASIC METABOLIC PANEL
BUN/Creatinine Ratio: 13 (ref 10–24)
BUN: 15 mg/dL (ref 8–27)
CO2: 26 mmol/L (ref 20–29)
Calcium: 9.6 mg/dL (ref 8.6–10.2)
Chloride: 99 mmol/L (ref 96–106)
Creatinine, Ser: 1.16 mg/dL (ref 0.76–1.27)
GFR calc Af Amer: 75 mL/min/{1.73_m2} (ref >59)
GFR calc non Af Amer: 65 mL/min/{1.73_m2} (ref >59)
Glucose: 208 mg/dL — ABNORMAL HIGH (ref 65–99)
Potassium: 5 mmol/L (ref 3.5–5.2)
Sodium: 139 mmol/L (ref 134–144)

## 2018-01-23 LAB — CBC
Hematocrit: 41.3 % (ref 37.5–51.0)
Hemoglobin: 14 g/dL (ref 13.0–17.7)
MCH: 27.8 pg (ref 26.6–33.0)
MCHC: 33.9 g/dL (ref 31.5–35.7)
MCV: 82 fL (ref 79–97)
Platelets: 98 10*3/uL — CL (ref 150–450)
RBC: 5.04 x10E6/uL (ref 4.14–5.80)
RDW: 14.5 % (ref 12.3–15.4)
WBC: 4.7 10*3/uL (ref 3.4–10.8)

## 2018-01-23 LAB — LIPID PANEL
Chol/HDL Ratio: 3.6 ratio (ref 0.0–5.0)
Cholesterol, Total: 109 mg/dL (ref 100–199)
HDL: 30 mg/dL — ABNORMAL LOW (ref >39)
LDL Calculated: 50 mg/dL (ref 0–99)
Triglycerides: 144 mg/dL (ref 0–149)
VLDL Cholesterol Cal: 29 mg/dL (ref 5–40)

## 2018-01-23 LAB — HEPATIC FUNCTION PANEL
ALT: 16 IU/L (ref 0–44)
AST: 15 IU/L (ref 0–40)
Albumin: 4.4 g/dL (ref 3.6–4.8)
Alkaline Phosphatase: 83 IU/L (ref 39–117)
Bilirubin Total: 0.8 mg/dL (ref 0.0–1.2)
Bilirubin, Direct: 0.28 mg/dL (ref 0.00–0.40)
Total Protein: 6.8 g/dL (ref 6.0–8.5)

## 2018-01-30 DIAGNOSIS — Z961 Presence of intraocular lens: Secondary | ICD-10-CM | POA: Diagnosis not present

## 2018-01-30 DIAGNOSIS — H25812 Combined forms of age-related cataract, left eye: Secondary | ICD-10-CM | POA: Diagnosis not present

## 2018-03-18 ENCOUNTER — Other Ambulatory Visit: Payer: Self-pay | Admitting: Nurse Practitioner

## 2018-03-18 DIAGNOSIS — I420 Dilated cardiomyopathy: Secondary | ICD-10-CM

## 2018-03-18 DIAGNOSIS — I5022 Chronic systolic (congestive) heart failure: Secondary | ICD-10-CM

## 2018-03-18 DIAGNOSIS — I10 Essential (primary) hypertension: Secondary | ICD-10-CM

## 2018-03-18 DIAGNOSIS — R7989 Other specified abnormal findings of blood chemistry: Secondary | ICD-10-CM

## 2018-03-18 DIAGNOSIS — E7800 Pure hypercholesterolemia, unspecified: Secondary | ICD-10-CM

## 2018-03-18 DIAGNOSIS — R799 Abnormal finding of blood chemistry, unspecified: Secondary | ICD-10-CM

## 2018-07-18 ENCOUNTER — Telehealth: Payer: Self-pay | Admitting: *Deleted

## 2018-07-18 NOTE — Telephone Encounter (Signed)
Virtual Visit Pre-Appointment Phone Call  "(Name), I am calling you today to discuss your upcoming appointment. We are currently trying to limit exposure to the virus that causes COVID-19 by seeing patients at home rather than in the office."  1. "What is the BEST phone number to call the day of the visit?" - include this in appointment notes  2. "Do you have or have access to (through a family member/friend) a smartphone with video capability that we can use for your visit?" a. If yes - list this number in appt notes as "cell" (if different from BEST phone #) and list the appointment type as a VIDEO visit in appointment notes b. If no - list the appointment type as a PHONE visit in appointment notes  3. Confirm consent - "In the setting of the current Covid19 crisis, you are scheduled for a (phone or video) visit with your provider on (Tuesday, June 9) at (9:30 am ).  Just as we do with many in-office visits, in order for you to participate in this visit, we must obtain consent.  If you'd like, I can send this to your mychart (if signed up) or email for you to review.  Otherwise, I can obtain your verbal consent now.  All virtual visits are billed to your insurance company just like a normal visit would be.  By agreeing to a virtual visit, we'd like you to understand that the technology does not allow for your provider to perform an examination, and thus may limit your provider's ability to fully assess your condition. If your provider identifies any concerns that need to be evaluated in person, we will make arrangements to do so.  Finally, though the technology is pretty good, we cannot assure that it will always work on either your or our end, and in the setting of a video visit, we may have to convert it to a phone-only visit.  In either situation, we cannot ensure that we have a secure connection.  Are you willing to proceed?" STAFF: Did the patient verbally acknowledge consent to telehealth  visit? Document YES/NO here: YES.  4. Advise patient to be prepared - "Two hours prior to your appointment, go ahead and check your blood pressure, pulse, oxygen saturation, and your weight (if you have the equipment to check those) and write them all down. When your visit starts, your provider will ask you for this information. If you have an Apple Watch or Kardia device, please plan to have heart rate information ready on the day of your appointment. Please have a pen and paper handy nearby the day of the visit as well."  5. Give patient instructions for MyChart download to smartphone OR Doximity/Doxy.me as below if video visit (depending on what platform provider is using)  6. Inform patient they will receive a phone call 15 minutes prior to their appointment time (may be from unknown caller ID) so they should be prepared to answer    TELEPHONE CALL NOTE  NEBIYU GERHART has been deemed a candidate for a follow-up tele-health visit to limit community exposure during the Covid-19 pandemic. I spoke with the patient via phone to ensure availability of phone/video source, confirm preferred email & phone number, and discuss instructions and expectations.  I reminded Jesse Torres to be prepared with any vital sign and/or heart rhythm information that could potentially be obtained via home monitoring, at the time of his visit. I reminded TRACY PASLEY to expect a  phone call prior to his visit.  Danielle Leanord Asal 07/18/2018 8:24 AM   INSTRUCTIONS FOR DOWNLOADING THE MYCHART APP TO SMARTPHONE  - The patient must first make sure to have activated MyChart and know their login information - If Apple, go to [employer] and type in MyChart in the search bar and download the app. If Android, ask patient to go to [employer] and type in [place] in the search bar and download the app. The app is free but as with any other app downloads, their phone may require them to verify saved payment  information or Apple/Android password.  - The patient will need to then log into the app with their MyChart username and password, and select [place] as their healthcare provider to link the account. When it is time for your visit, go to the MyChart app, find appointments, and click Begin Video Visit. Be sure to Select Allow for your device to access the Microphone and Camera for your visit. You will then be connected, and your provider will be with you shortly.  **If they have any issues connecting, or need assistance please contact MyChart service desk [phone])**  **If using a computer, in order to ensure the best quality for their visit they will need to use either of the following Internet Browsers: [employer] or Google Chrome**  IF USING DOXIMITY or DOXY.ME - The patient will receive a link just prior to their visit by text.     FULL LENGTH CONSENT FOR TELE-HEALTH VISIT   I hereby voluntarily request, consent and authorize [facility] and its employed or contracted physicians, physician assistants, nurse practitioners or other licensed health care professionals (the Practitioner), to provide me with telemedicine health care services (the "Services") as deemed necessary by the treating Practitioner. I acknowledge and consent to receive the Services by the Practitioner via telemedicine. I understand that the telemedicine visit will involve communicating with the Practitioner through live audiovisual communication technology and the disclosure of certain medical information by electronic transmission. I acknowledge that I have been given the opportunity to request an in-person assessment or other available alternative prior to the telemedicine visit and am voluntarily participating in the telemedicine visit.  I understand that I have the right to withhold or withdraw my consent to the use of telemedicine in the course of my care at any time, without affecting my right  to future care or treatment, and that the Practitioner or I may terminate the telemedicine visit at any time. I understand that I have the right to inspect all information obtained and/or recorded in the course of the telemedicine visit and may receive copies of available information for a reasonable fee.  I understand that some of the potential risks of receiving the Services via telemedicine include:  Marland Kitchen Delay or interruption in medical evaluation due to technological equipment failure or disruption; . Information transmitted may not be sufficient (e.g. poor resolution of images) to allow for appropriate medical decision making by the Practitioner; and/or  . In rare instances, security protocols could fail, causing a breach of personal health information.  Furthermore, I acknowledge that it is my responsibility to provide information about my medical history, conditions and care that is complete and accurate to the best of my ability. I acknowledge that Practitioner's advice, recommendations, and/or decision may be based on factors not within their control, such as incomplete or inaccurate data provided by me or distortions of diagnostic images or specimens that may result  from electronic transmissions. I understand that the practice of medicine is not an exact science and that Practitioner makes no warranties or guarantees regarding treatment outcomes. I acknowledge that I will receive a copy of this consent concurrently upon execution via email to the email address I last provided but may also request a printed copy by calling the office of [place].    I understand that my insurance will be billed for this visit.   I have read or had this consent read to me. . I understand the contents of this consent, which adequately explains the benefits and risks of the Services being provided via telemedicine.  . I have been provided ample opportunity to ask questions regarding this consent and the Services  and have had my questions answered to my satisfaction. . I give my informed consent for the services to be provided through the use of telemedicine in my medical care  By participating in this telemedicine visit I agree to the above.

## 2018-07-22 NOTE — Progress Notes (Signed)
Telehealth Visit     Virtual Visit via Video Note   This visit type was conducted due to national recommendations for restrictions regarding the COVID-19 Pandemic (e.g. social distancing) in an effort to limit this patient's exposure and mitigate transmission in our community.  Due to his co-morbid illnesses, this patient is at least at moderate risk for complications without adequate follow up.  This format is felt to be most appropriate for this patient at this time.  All issues noted in this document were discussed and addressed.  A limited physical exam was performed with this format.  Please refer to the patient's chart for his consent to telehealth for [facility].   Evaluation Performed:  Follow-up visit  This visit type was conducted due to national recommendations for restrictions regarding the COVID-19 Pandemic (e.g. social distancing).  This format is felt to be most appropriate for this patient at this time.  All issues noted in this document were discussed and addressed.  No physical exam was performed (except for noted visual exam findings with Video Visits).  Please refer to the patient's chart (MyChart message for video visits and phone note for telephone visits) for the patient's consent to telehealth for [facility].  Date:  07/23/2018   ID:  Jesse Torres, DOB [birth year], MRN [id number]  Patient Location:  Home  Provider location:   Home  PCP:  Dessa Phi, MD  Cardiologist:  Tyrone Sage Electrophysiologist:  None   Chief Complaint:  Follow up visit.   History of Present Illness:    Jesse Torres is a 68 y.o. male who presents via audio/video conferencing for a telehealth visit today.  Former patient of Dr. Henrietta Hoover.Now follows with me.  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 Initially his ejection fraction was noted at 35%. There was no ischemia by past nuclear scan. Cardiac catheterization was never done. He is been very compliant with his medications  and has had normalization of left ventricular function - last echo from March of 2016.Other issues as noted below and include chronic thrombocytopenia.He has declined referral to hematology.  Seen in [place] 2017and was felt to be doing ok.Labs showed worsening CKD. Had to cut back his aldactone.He has done well since - last seen in December of 2019.   The patient does not have symptoms concerning for COVID-19 infection (fever, chills, cough, or new shortness of breath).   Seen today by telephone conversation. He has consented for this visit. He does not have MyChart, no Internet or smart phone capability. He feels like he is doing well. No chest pain. Breathing is good. He has lost weight intentionally. BP little soft today - he says it has been good - he is not dizzy or lightheaded. He has mild bruising. He remains on aspirin. No active bleeding. Tolerating his medicines. Overall, has no real concerns.   Past Medical History:  Diagnosis Date  . 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  . Chronic systolic CHF (congestive heart failure) (HCC)    a. Echocardiogram (02/05/2013): Mild LVH, EF 25-30%, diffuse HK, mild MR, mild LAE, mild RVE, moderately reduced RVSF, mild RAE, PASP 64, trivial effusion.;  b.  Echo (04/2013): EF 30-35%, diffuse HK, Gr 2 DD, mod LAE, mildly reduced RVSF, mild RAE, PASP 46  . Ejection fraction < 50%   . Hypertension   . Pulmonary HTN (HCC)   . Snoring  needs sleep study  . Thigh shingles 01/04/2015   R L2 dermatome   . Thrombocytopenia ([place])    Past Surgical History:  Procedure Laterality Date  . none       Current Meds  Medication Sig  . aspirin EC 81 MG tablet Take 1 tablet (81 mg total) by mouth daily.  . carvedilol (COREG) 12.5 MG tablet TAKE 1 TABLET TWICE DAILY WITH MEALS  . furosemide (LASIX) 20 MG tablet TAKE 1 TABLET EVERY  DAY  . lisinopril (PRINIVIL,ZESTRIL) 40 MG tablet TAKE 1 TABLET TWICE DAILY  . simvastatin (ZOCOR) 5 MG tablet TAKE 1 TABLET (5 MG TOTAL) BY MOUTH DAILY.  Marland Kitchen spironolactone (ALDACTONE) 25 MG tablet TAKE 1/2 TABLET EVERY DAY     Allergies:   Patient has no known allergies.   Social History   Tobacco Use  . Smoking status: Former Smoker    Types: Cigarettes    Last attempt to quit: 02/06/1995    Years since quitting: 23.4  . Smokeless tobacco: Never Used  Substance Use Topics  . Alcohol use: No  . Drug use: No     Family Hx: The patient's family history includes Heart attack in his mother; Stroke in his father.  ROS:   Please see the history of present illness.   All other systems reviewed are negative.    Objective:    Vital Signs:  BP (!) 101/56   Pulse 66   Ht 5\' 7"  (1.702 m)   Wt 225 lb 6.4 oz (102.2 kg)   BMI 35.30 kg/m    Wt Readings from Last 3 Encounters:  07/23/18 225 lb 6.4 oz (102.2 kg)  01/22/18 233 lb (105.7 kg)  07/24/17 233 lb 12.8 oz (106.1 kg)    Alert male in no acute distress. Not short of breath with conversation. His weight is down.    Labs/Other Tests and Data Reviewed:    Lab Results  Component Value Date   WBC 4.7 01/22/2018   HGB 14.0 01/22/2018   HCT 41.3 01/22/2018   PLT 98 (LL) 01/22/2018   GLUCOSE 208 (H) 01/22/2018   CHOL 109 01/22/2018   TRIG 144 01/22/2018   HDL 30 (L) 01/22/2018   LDLCALC 50 01/22/2018   ALT 16 01/22/2018   AST 15 01/22/2018   NA 139 01/22/2018   K 5.0 01/22/2018   CL 99 01/22/2018   CREATININE 1.16 01/22/2018   BUN 15 01/22/2018   CO2 26 01/22/2018   TSH 3.540 07/24/2017   HGBA1C 5.4 03/11/2013     BNP (last 3 results) No results for input(s): BNP in the last 8760 hours.  ProBNP (last 3 results) No results for input(s): PROBNP in the last 8760 hours.    Prior CV studies:    The following studies were reviewed today:  Echo Study Conclusions 04/2014  - Left ventricle: The cavity size was  normal. Wall thickness was normal. Systolic function was normal. The estimated ejection fraction was in the range of 60% to 65%.Wall motion was normal; there were no regional wall motion abnormalities. - Left atrium: The atrium was mildly dilated. - Pulmonary arteries: Systolic pressure was mildly increased. PA peak pressure: 33 mm Hg (S).   ASSESSMENT & PLAN:    1. Hypertensive cardiomyopathy with chronic systolic HF -his last echo from 2016 was stable with normal EF - remains NYHA I/II. No changes are made today.   2. HTN -ok - little soft - asymptomatic - he will continue to monitor. Cautioned  him about the heat. He is to let us know if he has any dizzy spells - we may have to cut meds back if he continues to successfully lose weight.   3. HLD -remains on low dose statin - recheck lab later this week.   4. Chronic thrombocytopenia -no bleeding reported. he has declined prior offer to refer to hematology.  He is on aspirin - we could stop this at some point - recheck CBC later this week.  5. Prior CKD -for lab later this week  6. Obesity - actively losing weight. 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   7. COVID-19 Education: The signs and symptoms of COVID-19 were discussed with the patient and how to seek care for testing (follow up with PCP or arrange E-visit).  The importance of social distancing, staying at home, hand hygiene and wearing a mask when out in public were discussed today.  Patient Risk:   After full review of this patient's clinical status, I feel that they are at least moderate risk at this time.  Time:   Today, I have spent 8 minutes with the patient with telehealth technology discussing the above issues.     Medication Adjustments/Labs and Tests Ordered: Current medicines are reviewed at length with the patient today.  Concerns regarding medicines are outlined above.   Tests Ordered: No orders of the defined types were placed in this encounter.    Medication Changes: No orders of the defined types were placed in this encounter.   Disposition:  FU with me in the office in 6 months with fasting labs on return.   Patient is agreeable to this plan and will call if any problems develop in the interim.   Avelina LaineSigned, Aloysious Vangieson, NP  07/23/2018 9:27 AM    [facility]

## 2018-07-23 ENCOUNTER — Telehealth (INDEPENDENT_AMBULATORY_CARE_PROVIDER_SITE_OTHER): Payer: Medicare HMO | Admitting: Nurse Practitioner

## 2018-07-23 ENCOUNTER — Encounter: Payer: Self-pay | Admitting: Nurse Practitioner

## 2018-07-23 ENCOUNTER — Other Ambulatory Visit: Payer: Self-pay

## 2018-07-23 VITALS — BP 101/56 | HR 66 | Ht 67.0 in | Wt 225.4 lb

## 2018-07-23 DIAGNOSIS — Z79899 Other long term (current) drug therapy: Secondary | ICD-10-CM

## 2018-07-23 DIAGNOSIS — D696 Thrombocytopenia, unspecified: Secondary | ICD-10-CM

## 2018-07-23 DIAGNOSIS — E7800 Pure hypercholesterolemia, unspecified: Secondary | ICD-10-CM | POA: Diagnosis not present

## 2018-07-23 DIAGNOSIS — I10 Essential (primary) hypertension: Secondary | ICD-10-CM | POA: Diagnosis not present

## 2018-07-23 DIAGNOSIS — I420 Dilated cardiomyopathy: Secondary | ICD-10-CM | POA: Diagnosis not present

## 2018-07-23 DIAGNOSIS — Z7189 Other specified counseling: Secondary | ICD-10-CM | POA: Diagnosis not present

## 2018-07-23 DIAGNOSIS — I5022 Chronic systolic (congestive) heart failure: Secondary | ICD-10-CM

## 2018-07-23 NOTE — Patient Instructions (Addendum)
After Visit Summary:  We will be checking the following labs today - NONE  We will do fasting labs this Friday on June 12 at the [facility] office.    Medication Instructions:    Continue with your current medicines.    If you need a refill on your cardiac medications before your next appointment, please call your pharmacy.     Testing/Procedures To Be Arranged:  N/A  Follow-Up:   See me in 6 months with fasting labs.     At [facility], you and your health needs are our priority.  As part of our continuing mission to provide you with exceptional heart care, we have created designated Provider Care Teams.  These Care Teams include your primary Cardiologist (physician) and Advanced Practice Providers (APPs -  Physician Assistants and Nurse Practitioners) who all work together to provide you with the care you need, when you need it.  Special Instructions:  . Stay safe, stay home, wash your hands for at least 20 seconds and wear a mask when out in public.  . It was good to talk with you today.  Marland Kitchen Keep a check on your BP for [place] - especially as you continue to lose weight. . Call me for any dizzy or lightheadedness spells.    Call the [place] office at [phone] if you have any questions, problems or concerns.

## 2018-07-24 ENCOUNTER — Telehealth: Payer: Self-pay | Admitting: *Deleted

## 2018-07-24 NOTE — Telephone Encounter (Signed)
    COVID-19 Pre-Screening Questions:  . In the past 7 to 10 days have you had a cough,  shortness of breath, headache, congestion, fever (100 or greater) body aches, chills, sore throat, or sudden loss of taste or sense of smell? . Have you been around anyone with known Covid 19. . Have you been around anyone who is awaiting Covid 19 test results in the past 7 to 10 days? . Have you been around anyone who has been exposed to Covid 19, or has mentioned symptoms of Covid 19 within the past 7 to 10 days?  If you have any concerns/questions about symptoms patients report during screening (either on the phone or at threshold). Contact the provider seeing the patient or DOD for further guidance.  If neither are available contact a member of the leadership team.           Contacted patient via phone call. No to all Covid 19 questions . Has a mask for  Lab visit.kb 

## 2018-07-26 ENCOUNTER — Other Ambulatory Visit: Payer: Self-pay

## 2018-07-26 ENCOUNTER — Other Ambulatory Visit: Payer: Medicare HMO | Admitting: *Deleted

## 2018-07-26 DIAGNOSIS — E7800 Pure hypercholesterolemia, unspecified: Secondary | ICD-10-CM

## 2018-07-26 DIAGNOSIS — I5022 Chronic systolic (congestive) heart failure: Secondary | ICD-10-CM | POA: Diagnosis not present

## 2018-07-26 DIAGNOSIS — D696 Thrombocytopenia, unspecified: Secondary | ICD-10-CM

## 2018-07-26 LAB — CBC

## 2018-07-27 LAB — HEPATIC FUNCTION PANEL
ALT: 18 IU/L (ref 0–44)
AST: 15 IU/L (ref 0–40)
Albumin: 4 g/dL (ref 3.8–4.8)
Alkaline Phosphatase: 89 IU/L (ref 39–117)
Bilirubin Total: 0.8 mg/dL (ref 0.0–1.2)
Bilirubin, Direct: 0.26 mg/dL (ref 0.00–0.40)
Total Protein: 6.4 g/dL (ref 6.0–8.5)

## 2018-07-27 LAB — CBC
Hematocrit: 39.3 % (ref 37.5–51.0)
Hemoglobin: 13 g/dL (ref 13.0–17.7)
MCH: 29.3 pg (ref 26.6–33.0)
MCHC: 33.1 g/dL (ref 31.5–35.7)
MCV: 89 fL (ref 79–97)
Platelets: 95 10*3/uL — CL (ref 150–450)
RBC: 4.43 x10E6/uL (ref 4.14–5.80)
RDW: 14.4 % (ref 11.6–15.4)
WBC: 3.7 10*3/uL (ref 3.4–10.8)

## 2018-07-27 LAB — LIPID PANEL
Chol/HDL Ratio: 3.5 ratio (ref 0.0–5.0)
Cholesterol, Total: 102 mg/dL (ref 100–199)
HDL: 29 mg/dL — ABNORMAL LOW (ref >39)
LDL Calculated: 44 mg/dL (ref 0–99)
Triglycerides: 146 mg/dL (ref 0–149)
VLDL Cholesterol Cal: 29 mg/dL (ref 5–40)

## 2018-07-27 LAB — BASIC METABOLIC PANEL
BUN/Creatinine Ratio: 12 (ref 10–24)
BUN: 14 mg/dL (ref 8–27)
CO2: 24 mmol/L (ref 20–29)
Calcium: 8.9 mg/dL (ref 8.6–10.2)
Chloride: 97 mmol/L (ref 96–106)
Creatinine, Ser: 1.13 mg/dL (ref 0.76–1.27)
GFR calc Af Amer: 77 mL/min/{1.73_m2} (ref >59)
GFR calc non Af Amer: 67 mL/min/{1.73_m2} (ref >59)
Glucose: 412 mg/dL — ABNORMAL HIGH (ref 65–99)
Potassium: 4.3 mmol/L (ref 3.5–5.2)
Sodium: 135 mmol/L (ref 134–144)

## 2018-08-08 ENCOUNTER — Encounter: Payer: Self-pay | Admitting: Family Medicine

## 2018-08-08 ENCOUNTER — Ambulatory Visit: Payer: Medicare HMO | Attending: Family Medicine | Admitting: Family Medicine

## 2018-08-08 ENCOUNTER — Other Ambulatory Visit: Payer: Self-pay

## 2018-08-08 VITALS — BP 99/66 | HR 96 | Temp 97.9°F | Ht 67.0 in | Wt 223.0 lb

## 2018-08-08 DIAGNOSIS — E119 Type 2 diabetes mellitus without complications: Secondary | ICD-10-CM

## 2018-08-08 LAB — POCT GLYCOSYLATED HEMOGLOBIN (HGB A1C): Hemoglobin A1C: 9.8 % — AB (ref 4.0–5.6)

## 2018-08-08 LAB — GLUCOSE, POCT (MANUAL RESULT ENTRY): POC Glucose: 239 mg/dL — AB (ref 70–99)

## 2018-08-08 MED ORDER — ACCU-CHEK GUIDE ME W/DEVICE KIT: 1.0000 | PACK | Freq: Two times a day (BID) | 0 refills | Status: DC

## 2018-08-08 MED ORDER — GLIPIZIDE ER 5 MG PO TB24: 5.0000 mg | ORAL_TABLET | Freq: Every day | ORAL | 3 refills | Status: DC

## 2018-08-08 MED ORDER — ACCU-CHEK FASTCLIX LANCET KIT: PACK | 99 refills | Status: DC

## 2018-08-08 MED ORDER — ACCU-CHEK GUIDE VI STRP: ORAL_STRIP | 12 refills | Status: DC

## 2018-08-08 MED ORDER — METFORMIN HCL 500 MG PO TABS: ORAL_TABLET | ORAL | 3 refills | Status: DC

## 2018-08-08 MED ORDER — ACCU-CHEK FASTCLIX LANCETS MISC: 11 refills | Status: DC

## 2018-08-08 MED FILL — metFORMIN HCL 500 MG TABS: 500 | 30 days supply | Qty: 120 | Fill #0

## 2018-08-08 MED FILL — glipiZIDE XL 5 MG TB24: 5 | 30 days supply | Qty: 30 | Fill #0

## 2018-08-08 NOTE — Progress Notes (Signed)
Per pt his cardiologist told him to come here due to his blood sugar being really high  Today A1c is 9.8 and today CBG is 239

## 2018-08-08 NOTE — Progress Notes (Signed)
New Patient Office Visit  Subjective:  Patient ID: Jesse Torres, male    DOB: 02-20-1950  Age: 68 y.o. MRN: [id number]  CC:  Chief Complaint  Patient presents with  . Establish Care  . Diabetes    HPI SESAR MADEWELL, former patient at this office who was last seen in February 2017, who states that he was told to reestablish care here by his cardiologist secondary to elevated blood sugar.  On review of chart, patient had BMP done on 07/26/2018 by his cardiologist with glucose of 412 and patient states that he was told that his blood sugar was high.  Patient states that he started making changes in his diet such as eliminating pasta, rice, white bread and white potatoes.  He does not drink soda or juice.  At today's visit, patient's blood sugar is 239.  He reports that his girlfriend is diabetic and she has been checking his blood sugars which are generally in the 200s.  He denies any increased thirst.  Patient states that he takes Lasix for his CHF and therefore he has not noticed any increased urinary frequency as he tends to urinate frequently.  He also states that he was told that he could drink up to 2 L of water per day and he currently drinks that amount.  He denies any issues with blurred vision and has had no numbness or tingling in his hands or feet.  He reports that he did smoke in the past but has not smoked for many years.  He denies any current issues with chest pain or palpitations, no shortness of breath or cough, no headaches or dizziness.  He does have some swelling in his legs but this is controlled with his use of Lasix.  He denies any nausea/vomiting/diarrhea or constipation.  He has had no dysuria.  Past Medical History:  Diagnosis Date  . 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  . Chronic systolic CHF (congestive heart failure) ([place])    a.  Echocardiogram (02/05/2013): Mild LVH, EF 25-30%, diffuse HK, mild MR, mild LAE, mild RVE, moderately reduced RVSF, mild RAE, PASP 64, trivial effusion.;  b.  Echo (04/2013): EF 30-35%, diffuse HK, Gr 2 DD, mod LAE, mildly reduced RVSF, mild RAE, PASP 46  . Ejection fraction < 50%   . Hypertension   . Pulmonary HTN ([place])   . Snoring    needs sleep study  . Thigh shingles 01/04/2015   R L2 dermatome   . Thrombocytopenia ([place])     Past Surgical History:  Procedure Laterality Date  . Cataract surgery      Family History  Problem Relation Age of Onset  . Heart attack Mother   . Stroke Father     Social History   Tobacco Use  . Smoking status: Former Smoker    Types: Cigarettes    Quit date: 02/06/1995    Years since quitting: 23.5  . Smokeless tobacco: Never Used  Substance Use Topics  . Alcohol use: No  . Drug use: No  Patient is currently retired.  He previously worked as a Control and instrumentation engineer Review of Systems  Constitutional: Positive for fatigue (Occasional). Negative for chills and fever.  HENT: Negative for sore throat and trouble swallowing.   Eyes: Negative for photophobia and visual disturbance.  Respiratory: Negative for cough and shortness of breath.   Cardiovascular: Negative for  chest pain and palpitations.  Gastrointestinal: Negative for abdominal pain, constipation, diarrhea and nausea.  Endocrine: Positive for polyuria. Negative for polydipsia and polyphagia.  Genitourinary: Positive for frequency. Negative for dysuria.  Musculoskeletal: Negative for back pain and gait problem.  Neurological: Negative for dizziness, numbness and headaches.  Hematological: Negative for adenopathy. Does not bruise/bleed easily.    Objective:   Today's Vitals: BP 99/66 (BP Location: Right Arm, Patient Position: Sitting, Cuff Size: Large)   Pulse 96   Temp 97.9 F (36.6 C) (Oral)   Ht '5\' 7"'$  (1.702 m)   Wt 223 lb (101.2 kg)   SpO2 95%   BMI 34.93 kg/m   Physical  Exam Vitals signs and nursing note reviewed.  Constitutional:      Appearance: Normal appearance. He is obese.  Neck:     Musculoskeletal: Normal range of motion and neck supple.     Vascular: No carotid bruit.     Comments: Patient with a large sebaceous cyst on the right upper neck below the ear Cardiovascular:     Rate and Rhythm: Normal rate and regular rhythm.     Pulses:          Dorsalis pedis pulses are 1+ on the right side and 1+ on the left side.       Posterior tibial pulses are 1+ on the right side and 1+ on the left side.     Heart sounds: Normal heart sounds.  Pulmonary:     Effort: Pulmonary effort is normal.     Breath sounds: Normal breath sounds.  Abdominal:     Palpations: Abdomen is soft.     Tenderness: There is no abdominal tenderness. There is no right CVA tenderness, left CVA tenderness or guarding.     Hernia: A hernia (Umbilical, soft, reducible, nontender) is present.  Musculoskeletal:     Right lower leg: Edema present.     Left lower leg: Edema present.     Right foot: Deformity (Hammertoe deformities) present. No bunion, Charcot foot or foot drop.     Left foot: Deformity (Hammertoe deformities) present. No bunion, Charcot foot or foot drop.     Comments: Mild, nonpitting distal lower extremity edema  Feet:     Right foot:     Protective Sensation: 10 sites tested. 10 sites sensed.     Skin integrity: Dry skin present. No ulcer, blister, skin breakdown, callus or fissure.     Toenail Condition: Right toenails are abnormally thick and long. Fungal disease present.    Left foot:     Protective Sensation: 10 sites tested. 10 sites sensed.     Skin integrity: Dry skin present. No ulcer, blister, skin breakdown, callus or fissure.     Toenail Condition: Left toenails are abnormally thick and long. Fungal disease present. Lymphadenopathy:     Cervical: No cervical adenopathy.  Skin:    General: Skin is warm and dry.  Neurological:     General: No focal  deficit present.     Mental Status: He is alert and oriented to person, place, and time.  Psychiatric:        Mood and Affect: Mood normal.        Behavior: Behavior normal.        Thought Content: Thought content normal.        Judgment: Judgment normal.     Assessment & Plan:  1. Newly diagnosed diabetes [facility]) Patient with abnormal glucose of 413 on recent blood work approximately  2 weeks ago which was done by his cardiologist.  Patient has made changes in his diet and at today's visit, patient's glucose is 239 random with hemoglobin A1c of 9.8.  Pathophysiology of diabetes was discussed with the patient.  Patient agrees to start on metformin 500 mg initially taking 2 pills once daily after his evening meal and possible side effects including nausea, upset stomach/diarrhea discussed with the patient.  If he tolerates this well he will increase to 2 pills twice daily in 1 to 2 weeks.  We will also add Glucotrol XL 5 mg once daily just before breakfast.  Patient will continue to monitor his blood sugars and monitoring supplies and glucometer orders placed.  He should call or return sooner if he has difficulty tolerating the medications, continued elevation in blood sugars or if he feels that his sugars are getting too low with the use of medication.  Patient has been asked to follow-up in approximately 4 to 6 weeks and he will likely have additional blood work done at that time.  He is currently on lisinopril for blood pressure control and this will also help with renal protection.  Patient is also on simvastatin for hyperlipidemia per his cardiologist.  Diabetic foot care was also discussed at today's visit as patient with mycotic appearing toenails especially the great toenails which are also long and very thickened. - HgB A1c - Glucose (CBG) - metFORMIN (GLUCOPHAGE) 500 MG tablet; Take 2 pills once per day after the evening meal then increase to 2 pills twice per day after meals in 1 week   Dispense: 180 tablet; Refill: 3 - glipiZIDE (GLUCOTROL XL) 5 MG 24 hr tablet; Take 1 tablet (5 mg total) by mouth daily with breakfast. To lower blood pressure  Dispense: 30 tablet; Refill: 3 - Blood Glucose Monitoring Suppl (ACCU-CHEK GUIDE ME) w/Device KIT; 1 kit by Does not apply route 2 (two) times a day.  Dispense: 1 kit; Refill: 0 - glucose blood (ACCU-CHEK GUIDE) test strip; Use as instructed twice per day and as directed  Dispense: 100 each; Refill: 12 - Lancets Misc. (ACCU-CHEK FASTCLIX LANCET) KIT; Use when checking blood sugars  Dispense: 1 kit; Refill: prn - Accu-Chek FastClix Lancets MISC; Use to check blood sugars twice daily and as needed  Dispense: 100 each; Refill: 11   Outpatient Encounter Medications as of 08/08/2018  Medication Sig  . carvedilol (COREG) 12.5 MG tablet TAKE 1 TABLET TWICE DAILY WITH MEALS  . furosemide (LASIX) 20 MG tablet TAKE 1 TABLET EVERY DAY  . lisinopril (PRINIVIL,ZESTRIL) 40 MG tablet TAKE 1 TABLET TWICE DAILY  . simvastatin (ZOCOR) 5 MG tablet TAKE 1 TABLET (5 MG TOTAL) BY MOUTH DAILY.  Marland Kitchen spironolactone (ALDACTONE) 25 MG tablet TAKE 1/2 TABLET EVERY DAY  . Accu-Chek FastClix Lancets MISC Use to check blood sugars twice daily and as needed  . aspirin EC 81 MG tablet Take 1 tablet (81 mg total) by mouth daily. (Patient not taking: Reported on 08/08/2018)  . Blood Glucose Monitoring Suppl (ACCU-CHEK GUIDE ME) w/Device KIT 1 kit by Does not apply route 2 (two) times a day.  Marland Kitchen glipiZIDE (GLUCOTROL XL) 5 MG 24 hr tablet Take 1 tablet (5 mg total) by mouth daily with breakfast. To lower blood pressure  . glucose blood (ACCU-CHEK GUIDE) test strip Use as instructed twice per day and as directed  . Lancets Misc. (ACCU-CHEK FASTCLIX LANCET) KIT Use when checking blood sugars  . metFORMIN (GLUCOPHAGE) 500 MG tablet Take 2 pills  once per day after the evening meal then increase to 2 pills twice per day after meals in 1 week   No facility-administered encounter  medications on file as of 08/08/2018.     Follow-up: Return in about 6 weeks (around 09/19/2018) for DM.   Antony Blackbird, MD

## 2018-12-27 ENCOUNTER — Other Ambulatory Visit: Payer: Self-pay | Admitting: Nurse Practitioner

## 2018-12-27 DIAGNOSIS — E7800 Pure hypercholesterolemia, unspecified: Secondary | ICD-10-CM

## 2018-12-27 DIAGNOSIS — I420 Dilated cardiomyopathy: Secondary | ICD-10-CM

## 2018-12-27 DIAGNOSIS — I5022 Chronic systolic (congestive) heart failure: Secondary | ICD-10-CM

## 2018-12-27 DIAGNOSIS — R7989 Other specified abnormal findings of blood chemistry: Secondary | ICD-10-CM

## 2018-12-27 DIAGNOSIS — I10 Essential (primary) hypertension: Secondary | ICD-10-CM

## 2018-12-27 DIAGNOSIS — R799 Abnormal finding of blood chemistry, unspecified: Secondary | ICD-10-CM

## 2019-01-07 NOTE — Progress Notes (Signed)
CARDIOLOGY OFFICE NOTE  Date:  01/15/2019    Jesse Torres Date of Birth: 03-18-1950 Medical Record #[id number]  PCP:  Jesse Nearing, MD  Cardiologist:  Jesse Torres   Chief Complaint  Patient presents with  . Follow-up    History of Present Illness: Jesse Torres is a 68 y.o. male who presents today for a follow up visit.  Former patient of Dr. Kae Torres.Now follows with me.  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 Initially his ejection fraction was noted at 35%. There was no ischemia by past nuclear scan. Cardiac catheterization was never done. He is been very compliant with his medications and has had normalization of left ventricular function - last echo from March of 2016.Other issues as noted below and include CKD - which has caused [place] to cut Aldactone back, chronic thrombocytopenia.He has declined referral to hematology despite multiple discussions.  Last seen in the office a year ago. We did a telehealth visit back in June and he was doing ok. We have stopped his aspirin therapy due to low platelets - he has no known CAD. He declined again seeing someone with Hematology.   The patient does not have symptoms concerning for COVID-19 infection (fever, chills, cough, or new shortness of breath).   Comes in today. Here alone. He is doing well. He never took any of the blood sugar medicines - says he changed his diet - has lost some weight. Feels good. No chest pain. Breathing is good. No swelling. Weight is down. No bleeding. Off aspirin. Energy is good. He is happy with how he is doing. 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 Not lightheaded or dizzy.   Past Medical History:  Diagnosis Date  . 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  . Chronic systolic CHF (congestive heart failure) ([place])    a. Echocardiogram (02/05/2013): Mild LVH, EF  25-30%, diffuse HK, mild MR, mild LAE, mild RVE, moderately reduced RVSF, mild RAE, PASP 64, trivial effusion.;  b.  Echo (04/2013): EF 30-35%, diffuse HK, Gr 2 DD, mod LAE, mildly reduced RVSF, mild RAE, PASP 46  . Ejection fraction < 50%   . Hypertension   . Pulmonary HTN ([place])   . Snoring    needs sleep study  . Thigh shingles 01/04/2015   R L2 dermatome   . Thrombocytopenia ([place])     Past Surgical History:  Procedure Laterality Date  . Cataract surgery       Medications: Current Meds  Medication Sig  . Accu-Chek FastClix Lancets MISC Use to check blood sugars twice daily and as needed  . Blood Glucose Monitoring Suppl (ACCU-CHEK GUIDE ME) w/Device KIT 1 kit by Does not apply route 2 (two) times a day.  . carvedilol (COREG) 12.5 MG tablet Take 12.5 mg by mouth 2 (two) times daily with a meal.  . furosemide (LASIX) 20 MG tablet TAKE 1 TABLET EVERY DAY  . glucose blood (ACCU-CHEK GUIDE) test strip Use as instructed twice per day and as directed  . Lancets Misc. (ACCU-CHEK FASTCLIX LANCET) KIT Use when checking blood sugars  . lisinopril (ZESTRIL) 40 MG tablet TAKE 1 TABLET TWICE DAILY  . simvastatin (ZOCOR) 5 MG tablet TAKE 1 TABLET EVERY DAY  . spironolactone (ALDACTONE) 25 MG tablet TAKE 1/2 TABLET EVERY DAY     Allergies: No Known Allergies  Social History: The patient  reports  that he quit smoking about 23 years ago. His smoking use included cigarettes. He has never used smokeless tobacco. He reports that he does not drink alcohol or use drugs.   Family History: The patient's family history includes Heart attack in his mother; Stroke in his father.   Review of Systems: Please see the history of present illness.   All other systems are reviewed and negative.   Physical Exam: VS:  BP 136/84   Pulse (!) 50   Ht _0  (1.702 m)   Wt 219 lb (99.3 kg)   SpO2 98%   BMI 34.30 kg/m  .  BMI Body mass index is 34.3 kg/m.  Wt Readings from Last 3 Encounters:  01/15/19  219 lb (99.3 kg)  08/08/18 223 lb (101.2 kg)  07/23/18 225 lb 6.4 oz (102.2 kg)    General: Pleasant. Well developed, well nourished and in no acute distress.  His weight is down 6 pounds.  HEENT: Normal.  Neck: Supple, no JVD, carotid bruits, or masses noted.  Cardiac: Regular rate and rhythm. Occasional ectopic noted. No murmurs, rubs, or gallops. No edema.  Respiratory:  Lungs are clear to auscultation bilaterally with normal work of breathing.  GI: Soft and nontender.  MS: No deformity or atrophy. Gait and ROM intact.  Skin: Warm and dry. Color is normal.  Neuro:  Strength and sensation are intact and no gross focal deficits noted.  Psych: Alert, appropriate and with normal affect.   LABORATORY DATA:  EKG:  EKG is ordered today. This demonstrates sinus brady - PVC noted. HR is 52.   Lab Results  Component Value Date   WBC 3.7 07/26/2018   HGB 13.0 07/26/2018   HCT 39.3 07/26/2018   PLT 95 (LL) 07/26/2018   GLUCOSE 412 (H) 07/26/2018   CHOL 102 07/26/2018   TRIG 146 07/26/2018   HDL 29 (L) 07/26/2018   LDLCALC 44 07/26/2018   ALT 18 07/26/2018   AST 15 07/26/2018   NA 135 07/26/2018   K 4.3 07/26/2018   CL 97 07/26/2018   CREATININE 1.13 07/26/2018   BUN 14 07/26/2018   CO2 24 07/26/2018   TSH 3.540 07/24/2017   HGBA1C 9.8 (A) 08/08/2018     BNP (last 3 results) No results for input(s): BNP in the last 8760 hours.  ProBNP (last 3 results) No results for input(s): PROBNP in the last 8760 hours.   Other Studies Reviewed Today:  Echo Study Conclusions 04/2014  - Left ventricle: The cavity size was normal. Wall thickness was normal. Systolic function was normal. The estimated ejection fraction was in the range of 60% to 65%.Wall motion was normal; there were no regional wall motion abnormalities. - Left atrium: The atrium was mildly dilated. - Pulmonary arteries: Systolic pressure was mildly increased. PA peak pressure: 33 mm Hg (S).    ASSESSMENT & PLAN:    1. Hypertensive CM/chronic systolic HF - EF has recovered - he continues to do well clinically. NYHA I/II. No changes made today. Lab today.   2. HTN - BP looks good. No changes made today.   3. HLD - on statin - checking lab today..   4. Chronic thrombocytopenia - rechecking lab - off Aspirin - has declined seeing Hematology.   5. CKD - improved after reducing his Aldactone - rechecking today.   6. Newly diagnosed DM - he never started his diabetic medicines - he has changed his diet - lost weight - recheck A1C today.Marland Kitchen   7.  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   8. COVID-19 Education: The signs and symptoms of COVID-19 were discussed with the patient and how to seek care for testing (follow up with PCP or arrange E-visit).  The importance of social distancing, staying at home, hand hygiene and wearing a mask when out in public were discussed today.  Current medicines are reviewed with the patient today.  The patient does not have concerns regarding medicines other than what has been noted above.  The following changes have been made:  See above.  Labs/ tests ordered today include:    Orders Placed This Encounter  Procedures  . Basic metabolic panel  . CBC no Diff  . Hepatic function panel  . Lipid Profile  . HgB A1c  . EKG 12-Lead     Disposition:   FU with me in 6 months.   Patient is agreeable to this plan and will call if any problems develop in the interim.   SignedTruitt Merle, NP  01/15/2019 9:16 AM  [facility] [address] [place], [place]  [address] Phone: [phone] Fax: [phone]

## 2019-01-15 ENCOUNTER — Ambulatory Visit (INDEPENDENT_AMBULATORY_CARE_PROVIDER_SITE_OTHER): Payer: Medicare HMO | Admitting: Nurse Practitioner

## 2019-01-15 ENCOUNTER — Other Ambulatory Visit: Payer: Self-pay

## 2019-01-15 ENCOUNTER — Encounter: Payer: Self-pay | Admitting: Nurse Practitioner

## 2019-01-15 VITALS — BP 136/84 | HR 50 | Ht 67.0 in | Wt 219.0 lb

## 2019-01-15 DIAGNOSIS — E7800 Pure hypercholesterolemia, unspecified: Secondary | ICD-10-CM

## 2019-01-15 DIAGNOSIS — E0865 Diabetes mellitus due to underlying condition with hyperglycemia: Secondary | ICD-10-CM | POA: Diagnosis not present

## 2019-01-15 DIAGNOSIS — D696 Thrombocytopenia, unspecified: Secondary | ICD-10-CM | POA: Diagnosis not present

## 2019-01-15 DIAGNOSIS — Z7189 Other specified counseling: Secondary | ICD-10-CM

## 2019-01-15 DIAGNOSIS — Z79899 Other long term (current) drug therapy: Secondary | ICD-10-CM | POA: Diagnosis not present

## 2019-01-15 DIAGNOSIS — E1122 Type 2 diabetes mellitus with diabetic chronic kidney disease: Secondary | ICD-10-CM | POA: Diagnosis not present

## 2019-01-15 DIAGNOSIS — I130 Hypertensive heart and chronic kidney disease with heart failure and stage 1 through stage 4 chronic kidney disease, or unspecified chronic kidney disease: Secondary | ICD-10-CM | POA: Diagnosis not present

## 2019-01-15 DIAGNOSIS — E1165 Type 2 diabetes mellitus with hyperglycemia: Secondary | ICD-10-CM | POA: Diagnosis not present

## 2019-01-15 DIAGNOSIS — I10 Essential (primary) hypertension: Secondary | ICD-10-CM | POA: Diagnosis not present

## 2019-01-15 DIAGNOSIS — I5022 Chronic systolic (congestive) heart failure: Secondary | ICD-10-CM | POA: Diagnosis not present

## 2019-01-15 NOTE — Patient Instructions (Addendum)
After Visit Summary:  We will be checking the following labs today - BMET, CBC, A1C, lipids and LFTs   Medication Instructions:    Continue with your current medicines.    If you need a refill on your cardiac medications before your next appointment, please call your pharmacy.     Testing/Procedures To Be Arranged:  N/A  Follow-Up:   See me in 6 months.     At [facility], you and your health needs are our priority.  As part of our continuing mission to provide you with exceptional heart care, we have created designated Provider Care Teams.  These Care Teams include your primary Cardiologist (physician) and Advanced Practice Providers (APPs -  Physician Assistants and Nurse Practitioners) who all work together to provide you with the care you need, when you need it.  Special Instructions:  . Stay safe, stay home, wash your hands for at least 20 seconds and wear a mask when out in public.  . It was good to talk with you today.    Call the [place] office at [phone] if you have any questions, problems or concerns.

## 2019-01-16 LAB — HEMOGLOBIN A1C
Est. average glucose Bld gHb Est-mCnc: 126 mg/dL
Hgb A1c MFr Bld: 6 % — ABNORMAL HIGH (ref 4.8–5.6)

## 2019-01-16 LAB — BASIC METABOLIC PANEL
BUN/Creatinine Ratio: 16 (ref 10–24)
BUN: 20 mg/dL (ref 8–27)
CO2: 24 mmol/L (ref 20–29)
Calcium: 9.4 mg/dL (ref 8.6–10.2)
Chloride: 100 mmol/L (ref 96–106)
Creatinine, Ser: 1.22 mg/dL (ref 0.76–1.27)
GFR calc Af Amer: 70 mL/min/{1.73_m2} (ref >59)
GFR calc non Af Amer: 61 mL/min/{1.73_m2} (ref >59)
Glucose: 144 mg/dL — ABNORMAL HIGH (ref 65–99)
Potassium: 4.5 mmol/L (ref 3.5–5.2)
Sodium: 138 mmol/L (ref 134–144)

## 2019-01-16 LAB — LIPID PANEL
Chol/HDL Ratio: 2.9 ratio (ref 0.0–5.0)
Cholesterol, Total: 94 mg/dL — ABNORMAL LOW (ref 100–199)
HDL: 32 mg/dL — ABNORMAL LOW (ref >39)
LDL Chol Calc (NIH): 42 mg/dL (ref 0–99)
Triglycerides: 104 mg/dL (ref 0–149)
VLDL Cholesterol Cal: 20 mg/dL (ref 5–40)

## 2019-01-16 LAB — CBC
Hematocrit: 43.5 % (ref 37.5–51.0)
Hemoglobin: 14.5 g/dL (ref 13.0–17.7)
MCH: 27.5 pg (ref 26.6–33.0)
MCHC: 33.3 g/dL (ref 31.5–35.7)
MCV: 83 fL (ref 79–97)
Platelets: 84 10*3/uL — CL (ref 150–450)
RBC: 5.27 x10E6/uL (ref 4.14–5.80)
RDW: 14.4 % (ref 11.6–15.4)
WBC: 4.2 10*3/uL (ref 3.4–10.8)

## 2019-01-16 LAB — HEPATIC FUNCTION PANEL
ALT: 14 IU/L (ref 0–44)
AST: 11 IU/L (ref 0–40)
Albumin: 4.2 g/dL (ref 3.8–4.8)
Alkaline Phosphatase: 74 IU/L (ref 39–117)
Bilirubin Total: 0.6 mg/dL (ref 0.0–1.2)
Bilirubin, Direct: 0.2 mg/dL (ref 0.00–0.40)
Total Protein: 6.8 g/dL (ref 6.0–8.5)

## 2019-02-10 DIAGNOSIS — H25812 Combined forms of age-related cataract, left eye: Secondary | ICD-10-CM | POA: Diagnosis not present

## 2019-02-10 DIAGNOSIS — Z961 Presence of intraocular lens: Secondary | ICD-10-CM | POA: Diagnosis not present

## 2019-03-21 DIAGNOSIS — H25812 Combined forms of age-related cataract, left eye: Secondary | ICD-10-CM | POA: Diagnosis not present

## 2019-03-21 DIAGNOSIS — H278 Other specified disorders of lens: Secondary | ICD-10-CM | POA: Diagnosis not present

## 2019-03-24 DIAGNOSIS — Z961 Presence of intraocular lens: Secondary | ICD-10-CM | POA: Diagnosis not present

## 2019-03-24 DIAGNOSIS — H2702 Aphakia, left eye: Secondary | ICD-10-CM | POA: Diagnosis not present

## 2019-03-24 DIAGNOSIS — H59022 Cataract (lens) fragments in eye following cataract surgery, left eye: Secondary | ICD-10-CM | POA: Diagnosis not present

## 2019-03-26 DIAGNOSIS — H4302 Vitreous prolapse, left eye: Secondary | ICD-10-CM | POA: Diagnosis not present

## 2019-03-26 DIAGNOSIS — H59022 Cataract (lens) fragments in eye following cataract surgery, left eye: Secondary | ICD-10-CM | POA: Diagnosis not present

## 2019-03-26 DIAGNOSIS — H2702 Aphakia, left eye: Secondary | ICD-10-CM | POA: Diagnosis not present

## 2019-04-24 DIAGNOSIS — Z09 Encounter for follow-up examination after completed treatment for conditions other than malignant neoplasm: Secondary | ICD-10-CM | POA: Diagnosis not present

## 2019-04-24 DIAGNOSIS — H43822 Vitreomacular adhesion, left eye: Secondary | ICD-10-CM | POA: Diagnosis not present

## 2019-07-16 NOTE — Progress Notes (Signed)
CARDIOLOGY OFFICE NOTE  Date:  07/29/2019    Jesse Torres Date of Birth: 05/12/1950 Medical Record #[id number]  PCP:  Dessa Phi, MD  Cardiologist:  Tyrone Sage   Chief Complaint  Patient presents with  . Follow-up    History of Present Illness: Jesse Torres is a 69 y.o. male who presents today for a follow up visit. Former patient of Dr. Henrietta Hoover.Now follows with me.  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 Initially his ejection fraction was noted at 35%. There was no ischemia by past nuclear scan. Cardiac catheterization was never done. He is been very compliant with his medications and has had normalization of left ventricular function - last echo from March of 2016.Other issues as noted below and include CKD - which has caused [place] to cut Aldactone back, chronic thrombocytopenia.He has declined referral to hematology despite multiple discussions.We have stopped his aspirin - he has no known CAD.   Last seen in December and was felt to be doing well.   The patient does not have symptoms concerning for COVID-19 infection (fever, chills, cough, or new shortness of breath).   Comes in today. Here alone. He has been vaccinated. He had a fall earlier this month - slipped on a ramp after collecting some eggs - had back pain - ended up in the ER the following day - plain films ok. Taking some OTC supplement and he feels better. BP is ok. Not short of breath. His weight is up but he does not feel like this is fluid. Will have some swelling that will go away overnight. Not dizzy. No chest pain. He feels like he is doing well.   Past Medical History:  Diagnosis Date  . 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  . Chronic systolic CHF (congestive heart failure) (HCC)    a. Echocardiogram (02/05/2013): Mild LVH, EF 25-30%, diffuse HK, mild MR, mild LAE,  mild RVE, moderately reduced RVSF, mild RAE, PASP 64, trivial effusion.;  b.  Echo (04/2013): EF 30-35%, diffuse HK, Gr 2 DD, mod LAE, mildly reduced RVSF, mild RAE, PASP 46  . Ejection fraction < 50%   . Hypertension   . Pulmonary HTN (HCC)   . Snoring    needs sleep study  . Thigh shingles 01/04/2015   R L2 dermatome   . Thrombocytopenia (HCC)     Past Surgical History:  Procedure Laterality Date  . Cataract surgery       Medications: Current Meds  Medication Sig  . acetaminophen (TYLENOL) 500 MG tablet Take 1 tablet (500 mg total) by mouth every 6 (six) hours as needed.  . carvedilol (COREG) 12.5 MG tablet Take 12.5 mg by mouth 2 (two) times daily with a meal.  . furosemide (LASIX) 20 MG tablet TAKE 1 TABLET EVERY DAY  . glucose blood (ACCU-CHEK GUIDE) test strip Use as instructed twice per day and as directed  . ibuprofen (ADVIL) 200 MG tablet Take 400 mg by mouth every 6 (six) hours as needed for mild pain.  Marland Kitchen lisinopril (ZESTRIL) 40 MG tablet TAKE 1 TABLET TWICE DAILY  . simvastatin (ZOCOR) 5 MG tablet TAKE 1 TABLET EVERY DAY  . spironolactone (ALDACTONE) 25 MG tablet TAKE 1/2 TABLET EVERY DAY    Allergies: No Known Allergies  Social History: The patient  reports that he quit smoking about 24 years ago. His smoking use included  cigarettes. He has never used smokeless tobacco. He reports that he does not drink alcohol and does not use drugs.   Family History: The patient's family history includes Heart attack in his mother; Stroke in his father.   Review of Systems: Please see the history of present illness.   All other systems are reviewed and negative.   Physical Exam: VS:  BP 110/90   Pulse 64   Ht 5\' 7"  (1.702 m)   Wt 226 lb 12.8 oz (102.9 kg)   SpO2 98%   BMI 35.52 kg/m  .  BMI Body mass index is 35.52 kg/m.  Wt Readings from Last 3 Encounters:  07/29/19 226 lb 12.8 oz (102.9 kg)  07/18/19 220 lb (99.8 kg)  01/15/19 219 lb (99.3 kg)    General: Alert  and in no acute distress.  His weight is up.  HEENT: Normal.  Neck: Supple, no JVD, carotid bruits, or masses noted.  Cardiac: Regular rate and rhythm. No murmurs, rubs, or gallops. No edema.  Respiratory:  Lungs are clear to auscultation bilaterally with normal work of breathing.  GI: Soft and nontender.  MS: No deformity or atrophy. Gait and ROM intact.  Skin: Warm and dry. Color is normal. Seems less bruised.  Neuro:  Strength and sensation are intact and no gross focal deficits noted.  Psych: Alert, appropriate and with normal affect.   LABORATORY DATA:  EKG:  EKG is not ordered today.    Lab Results  Component Value Date   WBC 4.2 01/15/2019   HGB 14.5 01/15/2019   HCT 43.5 01/15/2019   PLT 84 (LL) 01/15/2019   GLUCOSE 144 (H) 01/15/2019   CHOL 94 (L) 01/15/2019   TRIG 104 01/15/2019   HDL 32 (L) 01/15/2019   LDLCALC 42 01/15/2019   ALT 14 01/15/2019   AST 11 01/15/2019   NA 138 01/15/2019   K 4.5 01/15/2019   CL 100 01/15/2019   CREATININE 1.22 01/15/2019   BUN 20 01/15/2019   CO2 24 01/15/2019   TSH 3.540 07/24/2017   HGBA1C 6.0 (H) 01/15/2019     BNP (last 3 results) No results for input(s): BNP in the last 8760 hours.  ProBNP (last 3 results) No results for input(s): PROBNP in the last 8760 hours.   Other Studies Reviewed Today:  Echo Study Conclusions 04/2014  - Left ventricle: The cavity size was normal. Wall thickness was normal. Systolic function was normal. The estimated ejection fraction was in the range of 60% to 65%.Wall motion was normal; there were no regional wall motion abnormalities. - Left atrium: The atrium was mildly dilated. - Pulmonary arteries: Systolic pressure was mildly increased. PA peak pressure: 33 mm Hg (S).   ASSESSMENT & PLAN:   1. NICM/hypertensive CM/chronic systolic HF - EF has recovered - he is on good guideline therapy with ACE/beta blocker/diuretic and Aldactone. Lab today. NYHA I/II   2. HTN - BP  recheck by me is 120/84 - no changes made today - I have left him on Lisinopril, Aldactone and Coreg.   3. HLD - on statin - lab today.   4. Recent fall - seems recovered.   5. Chronic thrombocytopenia - lab today - he has declined Hematology referral. No longer on aspirin.   6. CKD - lab today.   7. History of bradycardia - not symptomatic - would continue his Coreg.   8. DM - he never started medicines - he has tried to change diet/lose weight - unfortunately, weight  is back up - checking A1C today.   Current medicines are reviewed with the patient today.  The patient does not have concerns regarding medicines other than what has been noted above.  The following changes have been made:  See above.  Labs/ tests ordered today include:    Orders Placed This Encounter  Procedures  . Basic metabolic panel  . CBC  . Hepatic function panel  . Lipid panel     Disposition:   FU with me in 6 months.   Patient is agreeable to this plan and will call if any problems develop in the interim.   SignedTruitt Merle, NP  07/29/2019 8:49 AM  [facility] [address] [place], [place]  [address] Phone: [phone] Fax: [phone]

## 2019-07-18 ENCOUNTER — Encounter (HOSPITAL_COMMUNITY): Payer: Self-pay | Admitting: *Deleted

## 2019-07-18 ENCOUNTER — Emergency Department (HOSPITAL_COMMUNITY): Payer: Medicare HMO

## 2019-07-18 ENCOUNTER — Other Ambulatory Visit: Payer: Self-pay

## 2019-07-18 ENCOUNTER — Emergency Department (HOSPITAL_COMMUNITY)
Admission: EM | Admit: 2019-07-18 | Discharge: 2019-07-18 | Disposition: A | Discharge status: Home / Self Care | Payer: Medicare HMO | Attending: Emergency Medicine | Admitting: Emergency Medicine

## 2019-07-18 DIAGNOSIS — Z79899 Other long term (current) drug therapy: Secondary | ICD-10-CM | POA: Insufficient documentation

## 2019-07-18 DIAGNOSIS — I5021 Acute systolic (congestive) heart failure: Secondary | ICD-10-CM | POA: Diagnosis not present

## 2019-07-18 DIAGNOSIS — I110 Hypertensive heart disease with heart failure: Secondary | ICD-10-CM | POA: Insufficient documentation

## 2019-07-18 DIAGNOSIS — M545 Low back pain, unspecified: Secondary | ICD-10-CM

## 2019-07-18 DIAGNOSIS — Z87891 Personal history of nicotine dependence: Secondary | ICD-10-CM | POA: Insufficient documentation

## 2019-07-18 DIAGNOSIS — W1830XA Fall on same level, unspecified, initial encounter: Secondary | ICD-10-CM | POA: Insufficient documentation

## 2019-07-18 DIAGNOSIS — I2510 Atherosclerotic heart disease of native coronary artery without angina pectoris: Secondary | ICD-10-CM | POA: Diagnosis not present

## 2019-07-18 MED ORDER — CYCLOBENZAPRINE HCL 10 MG PO TABS
10.0000 mg | ORAL_TABLET | Freq: Two times a day (BID) | ORAL | 0 refills | Status: DC | PRN
Start: 2019-07-18 — End: 2019-07-29

## 2019-07-18 MED ORDER — ACETAMINOPHEN 500 MG PO TABS
500.0000 mg | ORAL_TABLET | Freq: Four times a day (QID) | ORAL | 0 refills | Status: AC | PRN
Start: 2019-07-18 — End: ?

## 2019-07-18 NOTE — ED Triage Notes (Signed)
Pt reports slipping and falling yesterday and now has right side lower back pain. Ambulatory at triage.

## 2019-07-18 NOTE — ED Notes (Signed)
Pt transported to Xray. 

## 2019-07-18 NOTE — Discharge Instructions (Signed)
Your x-ray today showed signs of arthritis which is not unusual as we get older.  It showed no evidence of any fractures involving the lumbar spine.  I suspect most of your pain is musculoskeletal.  1. Medications: Alternate 400-600 mg of ibuprofen and [phone] mg of Tylenol every 3-6 hours as needed for pain. Do not exceed 4000 mg of Tylenol daily.  Take ibuprofen with food to avoid upset stomach issues.  You can take Flexeril as needed for muscle spasm up to twice daily but do not drive, drink alcohol, or operate heavy machinery while taking this medicine because it may make you drowsy.  I typically recommend taking this medicine only at night when you are going to sleep.  You can also cut these tablets in half if they make you feel very drowsy. 2. Treatment: rest, drink plenty of fluids, gentle stretching as discussed (see attached or you can YouTube search low back physical therapy exercises), alternate ice and heat (or stick with whichever feels best) 20 minutes on 20 minutes off. 3. Follow Up: Please followup with your primary doctor in 3-7 days for discussion of your diagnoses and further evaluation after today's visit;Return to the ER for worsening back pain, difficulty walking, loss of bowel or bladder control or other concerning symptoms

## 2019-07-18 NOTE — ED Provider Notes (Signed)
[facility] EMERGENCY DEPARTMENT Provider Note   CSN: [id number] Arrival date & time: 07/18/19  4401     History Chief Complaint  Patient presents with  . Fall  . Back Pain    Jesse Torres is a 69 y.o. male with history of CHF, cardiomyopathy, hypertension, coronary artery disease, OSA presents for evaluation of acute onset, persistent right-sided low back pains secondary to mechanical fall yesterday.  He reports that he was walking down a ramp when his feet slipped out from under him and he landed on his back.  Denies head injury or loss of consciousness.  He is not on any blood thinners.  He reports that since then he has been experiencing intermittent burning right sided low back pains.  The pain does not radiate.  It worsens with position changes.  He denies numbness or weakness of the extremities, bowel or bladder incontinence, saddle anesthesia, fevers or history of IV drug use.  No abdominal pain, nausea, or vomiting.  He has been taking 400 mg of ibuprofen with some improvement in his pain.  The history is provided by the patient.       Past Medical History:  Diagnosis Date  . 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  . Chronic systolic CHF (congestive heart failure) ([place])    a. Echocardiogram (02/05/2013): Mild LVH, EF 25-30%, diffuse HK, mild MR, mild LAE, mild RVE, moderately reduced RVSF, mild RAE, PASP 64, trivial effusion.;  b.  Echo (04/2013): EF 30-35%, diffuse HK, Gr 2 DD, mod LAE, mildly reduced RVSF, mild RAE, PASP 46  . Ejection fraction < 50%   . Hypertension   . Pulmonary HTN ([place])   . Snoring    needs sleep study  . Thigh shingles 01/04/2015   R L2 dermatome   . Thrombocytopenia [facility])     Patient Active Problem List   Diagnosis Date Noted  . Elevated serum creatinine 07/29/2015  . Elevated BUN 07/29/2015  .  Postherpetic neuralgia 02/04/2015  . PVC's (premature ventricular contractions) 02/16/2014  . CAD (coronary artery disease) 05/05/2013  . Dyslipidemia 03/11/2013  . Cardiomyopathy ([place])   . Chronic systolic CHF (congestive heart failure) ([place])   . Thrombocytopenia ([place])   . Snoring   . Right ventricular dysfunction 02/06/2013  . Pulmonary hypertension ([place]) 02/06/2013  . RBBB (right bundle branch block) 02/06/2013  . Essential hypertension 02/05/2013  . Sleep apnea, obstructive 02/05/2013  . Obesity 02/05/2013    Past Surgical History:  Procedure Laterality Date  . Cataract surgery         Family History  Problem Relation Age of Onset  . Heart attack Mother   . Stroke Father     Social History   Tobacco Use  . Smoking status: Former Smoker    Types: Cigarettes    Quit date: 02/06/1995    Years since quitting: 24.4  . Smokeless tobacco: Never Used  Substance Use Topics  . Alcohol use: No  . Drug use: No    Home Medications Prior to Admission medications   Medication Sig Start Date End Date Taking? Authorizing Provider  carvedilol (COREG) 12.5 MG tablet Take 12.5 mg by mouth 2 (two) times daily with a meal.   Yes [provider]  furosemide (LASIX) 20 MG tablet TAKE 1 TABLET EVERY DAY Patient taking differently: Take 20 mg by mouth daily.  12/30/18  Yes Gerhardt,  Marlane Hatcher, NP  ibuprofen (ADVIL) 200 MG tablet Take 400 mg by mouth every 6 (six) hours as needed for mild pain.   Yes [provider]  lisinopril (ZESTRIL) 40 MG tablet TAKE 1 TABLET TWICE DAILY 12/30/18  Yes Burtis Junes, NP  simvastatin (ZOCOR) 5 MG tablet TAKE 1 TABLET EVERY DAY Patient taking differently: Take 5 mg by mouth every evening.  12/30/18  Yes Burtis Junes, NP  spironolactone (ALDACTONE) 25 MG tablet TAKE 1/2 TABLET EVERY DAY 12/30/18  Yes Burtis Junes, NP  Accu-Chek FastClix Lancets MISC Use to check blood sugars twice daily and as needed 08/08/18   Fulp, Cammie, MD   acetaminophen (TYLENOL) 500 MG tablet Take 1 tablet (500 mg total) by mouth every 6 (six) hours as needed. 07/18/19   Arsen Mangione A, PA-C  Blood Glucose Monitoring Suppl (ACCU-CHEK GUIDE ME) w/Device KIT 1 kit by Does not apply route 2 (two) times a day. 08/08/18   Fulp, Cammie, MD  cyclobenzaprine (FLEXERIL) 10 MG tablet Take 1 tablet (10 mg total) by mouth 2 (two) times daily as needed for muscle spasms. 07/18/19   Nils Flack, Christyann Manolis A, PA-C  glucose blood (ACCU-CHEK GUIDE) test strip Use as instructed twice per day and as directed 08/08/18   Fulp, Cammie, MD  Lancets Misc. (ACCU-CHEK FASTCLIX LANCET) KIT Use when checking blood sugars 08/08/18   Fulp, Cammie, MD    Allergies    Patient has no known allergies.  Review of Systems   Review of Systems  Constitutional: Negative for chills and fever.  Respiratory: Negative for shortness of breath.   Cardiovascular: Negative for chest pain.  Gastrointestinal: Negative for abdominal pain, nausea and vomiting.  Musculoskeletal: Positive for back pain.  Neurological: Negative for weakness and numbness.    Physical Exam Updated Vital Signs BP (!) 159/73   Pulse 63   Temp 97.9 F (36.6 C)   Resp 16   Ht '5\' 7"'$  (1.702 m)   Wt 99.8 kg   SpO2 99%   BMI 34.46 kg/m   Physical Exam Vitals and nursing note reviewed.  Constitutional:      General: He is not in acute distress.    Appearance: He is well-developed.  HENT:     Head: Normocephalic and atraumatic.  Eyes:     General:        Right eye: No discharge.        Left eye: No discharge.     Conjunctiva/sclera: Conjunctivae normal.  Neck:     Vascular: No JVD.     Trachea: No tracheal deviation.  Cardiovascular:     Rate and Rhythm: Normal rate.  Pulmonary:     Effort: Pulmonary effort is normal.  Abdominal:     General: There is no distension.     Palpations: Abdomen is soft.     Tenderness: There is no abdominal tenderness. There is no guarding or rebound.  Musculoskeletal:         General: Tenderness present.     Comments: Mild soreness on palpation of the right paralumbar region.  No midline lumbar spine tenderness.  No deformity, crepitus, or step-off.  Normal range of motion with flexion and extension of the lumbar spine.  5/5 strength of BLE major muscle groups  Skin:    General: Skin is warm and dry.     Findings: No erythema.  Neurological:     Mental Status: He is alert.     Comments: Sensation intact to light  touch of bilateral lower extremities.  Ambulatory with steady gait and balance.  Able to heel walk and toe walk without difficulty.  Psychiatric:        Behavior: Behavior normal.     ED Results / Procedures / Treatments   Labs (all labs ordered are listed, but only abnormal results are displayed) Labs Reviewed - No data to display  EKG None  Radiology DG Lumbar Spine Complete  Result Date: 07/18/2019 CLINICAL DATA:  Low back pain after fall yesterday. EXAM: LUMBAR SPINE - COMPLETE 4+ VIEW COMPARISON:  None. FINDINGS: Five lumbar type vertebral bodies. No acute fracture or subluxation. Vertebral body heights are preserved. Alignment is normal. Moderate disc height loss at L4-L5. Lower lumbar facet arthropathy. The sacroiliac joints are unremarkable. IMPRESSION: 1. No acute osseous abnormality. 2. 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 Electronically Signed   By: Titus Dubin M.D.   On: 07/18/2019 11:03    Procedures Procedures (including critical care time)  Medications Ordered in ED Medications - No data to display  ED Course  I have reviewed the triage vital signs and the nursing notes.  Pertinent labs & imaging results that were available during my care of the patient were reviewed by me and considered in my medical decision making (see chart for details).  Clinical Course as of Jul 17 1117  Fri Jul 18, 9462  799 69 year old male fell yesterday complaining of right-sided low back pain since.  No numbness or weakness.  No pain at  rest but worse with movement.  Normal gait.  Getting x-rays.  Likely symptomatic treatment and discharge.   [MB]    Clinical Course User Index [MB] Hayden Rasmussen, MD   MDM Rules/Calculators/A&P                      Patient presenting for evaluation of back pain secondary to mechanical fall yesterday.  No head injury or loss of consciousness.  He is afebrile, mildly hypertensive but vital signs otherwise stable.  He is nontoxic in appearance but appears to be a little uncomfortable.  Pain is not reproducible on palpation but does occur with position changes.  He is ambulatory in the ED without difficulty.  No red flag signs concerning for cauda equina or spinal abscess.  Plain films were obtained which show degenerative disc disease but no acute osseous abnormality, no evidence of vertebral body fracture or pars fracture.  Discussed conservative therapy and management of symptoms with NSAIDs, Tylenol, Flexeril as needed for muscle relaxation but we discussed potential side effects of NSAIDs and muscle relaxants with the patient and his daughter who is at the bedside.  He lives at home alone but family can stay with him in the next few days while he is healing from this.  He will follow up with his PCP for reevaluation of symptoms.  Discussed strict ED return precautions.  Patient and daughter verbalized understanding of and agreement with plan and patient is stable for discharge at this time. Final Clinical Impression(s) / ED Diagnoses Final diagnoses:  Acute right-sided low back pain without sciatica    Rx / DC Orders ED Discharge Orders         Ordered    cyclobenzaprine (FLEXERIL) 10 MG tablet  2 times daily PRN     07/18/19 1114    acetaminophen (TYLENOL) 500 MG tablet  Every 6 hours PRN     07/18/19 1114  Renita Papa, PA-C 07/18/19 1120    Hayden Rasmussen, MD 07/18/19 1956

## 2019-07-18 NOTE — ED Notes (Signed)
Pt back from X-ray.  

## 2019-07-29 ENCOUNTER — Ambulatory Visit: Payer: Medicare HMO | Admitting: Nurse Practitioner

## 2019-07-29 ENCOUNTER — Other Ambulatory Visit: Payer: Self-pay

## 2019-07-29 ENCOUNTER — Encounter: Payer: Self-pay | Admitting: Nurse Practitioner

## 2019-07-29 VITALS — BP 110/90 | HR 64 | Ht 67.0 in | Wt 226.8 lb

## 2019-07-29 DIAGNOSIS — E7800 Pure hypercholesterolemia, unspecified: Secondary | ICD-10-CM | POA: Diagnosis not present

## 2019-07-29 DIAGNOSIS — I10 Essential (primary) hypertension: Secondary | ICD-10-CM

## 2019-07-29 DIAGNOSIS — D696 Thrombocytopenia, unspecified: Secondary | ICD-10-CM | POA: Diagnosis not present

## 2019-07-29 DIAGNOSIS — E0865 Diabetes mellitus due to underlying condition with hyperglycemia: Secondary | ICD-10-CM | POA: Diagnosis not present

## 2019-07-29 DIAGNOSIS — I5022 Chronic systolic (congestive) heart failure: Secondary | ICD-10-CM | POA: Diagnosis not present

## 2019-07-29 NOTE — Patient Instructions (Addendum)
After Visit Summary:  We will be checking the following labs today - BMET, CBC, HPF, A1C and Lipids   Medication Instructions:    Continue with your current medicines.    If you need a refill on your cardiac medications before your next appointment, please call your pharmacy.     Testing/Procedures To Be Arranged:  N/A  Follow-Up:   See me in 6 months.     At [facility], you and your health needs are our priority.  As part of our continuing mission to provide you with exceptional heart care, we have created designated Provider Care Teams.  These Care Teams include your primary Cardiologist (physician) and Advanced Practice Providers (APPs -  Physician Assistants and Nurse Practitioners) who all work together to provide you with the care you need, when you need it.  Special Instructions:  . Stay safe, wash your hands for at least 20 seconds and wear a mask when needed.  . It was good to talk with you today.    Call the [facility] office at [phone] if you have any questions, problems or concerns.

## 2019-07-30 LAB — BASIC METABOLIC PANEL
BUN/Creatinine Ratio: 14 (ref 10–24)
BUN: 16 mg/dL (ref 8–27)
CO2: 26 mmol/L (ref 20–29)
Calcium: 9.6 mg/dL (ref 8.6–10.2)
Chloride: 98 mmol/L (ref 96–106)
Creatinine, Ser: 1.11 mg/dL (ref 0.76–1.27)
GFR calc Af Amer: 78 mL/min/{1.73_m2} (ref >59)
GFR calc non Af Amer: 68 mL/min/{1.73_m2} (ref >59)
Glucose: 175 mg/dL — ABNORMAL HIGH (ref 65–99)
Potassium: 5 mmol/L (ref 3.5–5.2)
Sodium: 136 mmol/L (ref 134–144)

## 2019-07-30 LAB — HEMOGLOBIN A1C
Est. average glucose Bld gHb Est-mCnc: 151 mg/dL
Hgb A1c MFr Bld: 6.9 % — ABNORMAL HIGH (ref 4.8–5.6)

## 2019-07-30 LAB — CBC
Hematocrit: 43.8 % (ref 37.5–51.0)
Hemoglobin: 14.5 g/dL (ref 13.0–17.7)
MCH: 27.6 pg (ref 26.6–33.0)
MCHC: 33.1 g/dL (ref 31.5–35.7)
MCV: 83 fL (ref 79–97)
Platelets: 87 10*3/uL — CL (ref 150–450)
RBC: 5.25 x10E6/uL (ref 4.14–5.80)
RDW: 14.5 % (ref 11.6–15.4)
WBC: 5.1 10*3/uL (ref 3.4–10.8)

## 2019-07-30 LAB — HEPATIC FUNCTION PANEL
ALT: 19 IU/L (ref 0–44)
AST: 19 IU/L (ref 0–40)
Albumin: 4.1 g/dL (ref 3.8–4.8)
Alkaline Phosphatase: 87 IU/L (ref 48–121)
Bilirubin Total: 0.9 mg/dL (ref 0.0–1.2)
Bilirubin, Direct: 0.27 mg/dL (ref 0.00–0.40)
Total Protein: 6.7 g/dL (ref 6.0–8.5)

## 2019-07-30 LAB — LIPID PANEL
Chol/HDL Ratio: 3.1 ratio (ref 0.0–5.0)
Cholesterol, Total: 98 mg/dL — ABNORMAL LOW (ref 100–199)
HDL: 32 mg/dL — ABNORMAL LOW (ref >39)
LDL Chol Calc (NIH): 45 mg/dL (ref 0–99)
Triglycerides: 112 mg/dL (ref 0–149)
VLDL Cholesterol Cal: 21 mg/dL (ref 5–40)

## 2019-10-10 ENCOUNTER — Other Ambulatory Visit: Payer: Self-pay | Admitting: Nurse Practitioner

## 2019-10-10 DIAGNOSIS — I420 Dilated cardiomyopathy: Secondary | ICD-10-CM

## 2019-10-10 DIAGNOSIS — R799 Abnormal finding of blood chemistry, unspecified: Secondary | ICD-10-CM

## 2019-10-10 DIAGNOSIS — I5022 Chronic systolic (congestive) heart failure: Secondary | ICD-10-CM

## 2019-10-10 DIAGNOSIS — R7989 Other specified abnormal findings of blood chemistry: Secondary | ICD-10-CM

## 2019-10-10 DIAGNOSIS — I10 Essential (primary) hypertension: Secondary | ICD-10-CM

## 2019-10-10 DIAGNOSIS — E7800 Pure hypercholesterolemia, unspecified: Secondary | ICD-10-CM

## 2019-11-03 ENCOUNTER — Other Ambulatory Visit: Payer: Self-pay | Admitting: Family Medicine

## 2019-11-03 DIAGNOSIS — E119 Type 2 diabetes mellitus without complications: Secondary | ICD-10-CM

## 2019-11-19 DIAGNOSIS — Z961 Presence of intraocular lens: Secondary | ICD-10-CM | POA: Diagnosis not present

## 2019-11-19 DIAGNOSIS — H43312 Vitreous membranes and strands, left eye: Secondary | ICD-10-CM | POA: Diagnosis not present

## 2019-11-19 DIAGNOSIS — H35352 Cystoid macular degeneration, left eye: Secondary | ICD-10-CM | POA: Diagnosis not present

## 2019-11-19 DIAGNOSIS — E119 Type 2 diabetes mellitus without complications: Secondary | ICD-10-CM | POA: Diagnosis not present

## 2019-12-03 LAB — HM DIABETES EYE EXAM

## 2019-12-08 ENCOUNTER — Ambulatory Visit (INDEPENDENT_AMBULATORY_CARE_PROVIDER_SITE_OTHER): Payer: Medicare HMO | Admitting: Ophthalmology

## 2019-12-08 ENCOUNTER — Other Ambulatory Visit: Payer: Self-pay

## 2019-12-08 ENCOUNTER — Encounter (INDEPENDENT_AMBULATORY_CARE_PROVIDER_SITE_OTHER): Payer: Self-pay | Admitting: Ophthalmology

## 2019-12-08 DIAGNOSIS — H35352 Cystoid macular degeneration, left eye: Secondary | ICD-10-CM | POA: Diagnosis not present

## 2019-12-08 DIAGNOSIS — H4302 Vitreous prolapse, left eye: Secondary | ICD-10-CM

## 2019-12-08 DIAGNOSIS — H43822 Vitreomacular adhesion, left eye: Secondary | ICD-10-CM

## 2019-12-08 HISTORY — DX: Vitreomacular adhesion, left eye: H43.822

## 2019-12-08 MED ORDER — TRIAMCINOLONE ACETONIDE 40 MG/ML IJ SUSP FOR KALEIDOSCOPE
4.0000 mg | INTRAMUSCULAR | Status: AC | PRN
  Administered 2019-12-08: 4 mg via INTRAVITREAL

## 2019-12-08 NOTE — Progress Notes (Signed)
12/08/2019     CHIEF COMPLAINT Patient presents for Retina Evaluation   HISTORY OF PRESENT ILLNESS: Jesse Torres is a 69 y.o. male who presents to the clinic today for:   HPI    Retina Evaluation    In left eye.  This started 3 weeks ago.  Duration of 3 weeks.  Context:  distance vision, mid-range vision and near vision.  Treatments tried include no treatments.          Comments    WIP - CME OS - Ref'd by Dr. Fabian Sharp  Pt c/o blurry VA OS x 3 weeks approx. No other symptoms reported OU.       Last edited by Ileana Roup, COA on 12/08/2019  8:36 AM. (History)      Referring physician: Dessa Phi, MD No address on file  HISTORICAL INFORMATION:   Selected notes from the MEDICAL RECORD NUMBER    Lab Results  Component Value Date   HGBA1C 6.9 (H) 07/29/2019     CURRENT MEDICATIONS: Current Outpatient Medications (Ophthalmic Drugs)  Medication Sig  . ketorolac (ACULAR) 0.5 % ophthalmic solution   . PROLENSA 0.07 % SOLN  (Patient not taking: Reported on 12/08/2019)   No current facility-administered medications for this visit. (Ophthalmic Drugs)   Current Outpatient Medications (Other)  Medication Sig  . acetaminophen (TYLENOL) 500 MG tablet Take 1 tablet (500 mg total) by mouth every 6 (six) hours as needed.  . carvedilol (COREG) 12.5 MG tablet TAKE 1 TABLET TWICE DAILY WITH MEALS  . furosemide (LASIX) 20 MG tablet TAKE 1 TABLET EVERY DAY  . glucose blood (ACCU-CHEK GUIDE) test strip Use as instructed twice per day and as directed  . ibuprofen (ADVIL) 200 MG tablet Take 400 mg by mouth every 6 (six) hours as needed for mild pain.  Marland Kitchen lisinopril (ZESTRIL) 40 MG tablet TAKE 1 TABLET TWICE DAILY  . simvastatin (ZOCOR) 5 MG tablet TAKE 1 TABLET EVERY DAY  . spironolactone (ALDACTONE) 25 MG tablet TAKE 1/2 TABLET EVERY DAY   No current facility-administered medications for this visit. (Other)      REVIEW OF SYSTEMS:    ALLERGIES No Known  Allergies  PAST MEDICAL HISTORY Past Medical History:  Diagnosis Date  . 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  . Chronic systolic CHF (congestive heart failure) (HCC)    a. Echocardiogram (02/05/2013): Mild LVH, EF 25-30%, diffuse HK, mild MR, mild LAE, mild RVE, moderately reduced RVSF, mild RAE, PASP 64, trivial effusion.;  b.  Echo (04/2013): EF 30-35%, diffuse HK, Gr 2 DD, mod LAE, mildly reduced RVSF, mild RAE, PASP 46  . Ejection fraction < 50%   . Hypertension   . Pulmonary HTN (HCC)   . Snoring    needs sleep study  . Thigh shingles 01/04/2015   R L2 dermatome   . Thrombocytopenia (HCC)    Past Surgical History:  Procedure Laterality Date  . Cataract surgery      FAMILY HISTORY Family History  Problem Relation Age of Onset  . Heart attack Mother   . Stroke Father     SOCIAL HISTORY Social History   Tobacco Use  . Smoking status: Former Smoker    Types: Cigarettes    Quit date: 02/06/1995    Years since quitting: 24.8  . Smokeless tobacco: Never Used  Vaping Use  . Vaping Use: Unknown  Substance  Use Topics  . Alcohol use: No  . Drug use: No         OPHTHALMIC EXAM: Base Eye Exam    Visual Acuity (ETDRS)      Right Left   Dist [place] 20/20 20/400   Dist ph [place]  NI       Tonometry (Tonopen, 8:37 AM)      Right Left   Pressure 09 14       Pupils      Pupils Dark Light Shape React APD   Right PERRL 4 3 Round Brisk None   Left PERRL 4 3 Round Brisk None       Visual Fields (Counting fingers)      Left Right    Full Full       Extraocular Movement      Right Left    Full Full       Neuro/Psych    Oriented x3: Yes   Mood/Affect: Normal       Dilation    Left eye: 1.0% Mydriacyl, 2.5% Phenylephrine @ 8:40 AM        Slit Lamp and Fundus Exam    Slit Lamp Exam      Right Left   Conjunctiva/Sclera  Long haptic tags  superonasal and inferotemporal subconjunctival, good coverage   Cornea  Clear   Anterior Chamber  Deep and quiet   Lens  Centered posterior chamber intraocular lens   Anterior Vitreous  Normal       Fundus Exam      Right Left   Posterior Vitreous  Normal   Disc  Normal   C/D Ratio  0.35   Macula  Cystoid macular edema, Macular thickening   Vessels  Normal   Periphery  Normal          IMAGING AND PROCEDURES  Imaging and Procedures for 12/08/19  OCT, Retina - OU - Both Eyes       Right Eye Quality was good. Scan locations included subfoveal. Central Foveal Thickness: 260. Progression has been stable. Findings include normal foveal contour.   Left Eye Quality was good. Central Foveal Thickness: 1043. Progression has worsened. Findings include abnormal foveal contour, cystoid macular edema, vitreous traction, vitreomacular adhesion .   Notes Massive CME, pseudophakic, late onset left eye.  Some role of vitreal macular traction and adhesion.  OS       Injection into Tenon's Capsule - OS - Left Eye       Time Out 12/08/2019. 9:41 AM. Confirmed correct patient, procedure, site, and patient consented.   Anesthesia No anesthesia was used.   Procedure A 27 gauge needle was used.   Injection:  4 mg KENALOG-40mg /ml injection 4mg    NDC: 0003-0293-05   Route: Intravitreal, Site: Left Eye  Post-op Post injection exam found visual acuity of at least counting fingers. The patient tolerated the procedure well. There were no complications. The patient received written and verbal post procedure care education.   Notes Delivery was subtenon's retroseptal   Transcutaneous and from infra orbital                ASSESSMENT/PLAN:  Cystoid macular edema of left eye New and slow onset post restoration of pseudophakia left eye via posterior chamber intraocular lens placement via Yamani scleral tunnel.  Most likely this is secondary to vitreal macular  traction's.  Vitreomacular adhesion of left eye This is clearly present and is likely playing a role in the vitreal macular traction particularly  since the patient had a vitreous strand anteriorly that could have been triggering IOL/vitreous chafe or possible vitreal ciliary body chafe  OS we will plan vitrectomy left eye.      ICD-10-CM   1. Cystoid macular edema of left eye  H35.352 Injection into Tenon's Capsule - OS - Left Eye    triamcinolone acetonide (KENALOG-40) injection 4 mg  2. Vitreous prolapse of left eye  H43.02 OCT, Retina - OU - Both Eyes  3. Vitreomacular adhesion of left eye  H43.822     1.  Will attempt medical therapy OS first, continue topical nonsteroidal, ketorolac 4 times daily OS  2.  Today we added retroseptal Kenalog left eye and follow-up in 5 weeks  3.  Discussed the possible need for vitrectomy if CME is not improving left eye  Ophthalmic Meds Ordered this visit:  Meds ordered this encounter  Medications  . triamcinolone acetonide (KENALOG-40) injection 4 mg       Return in about 5 weeks (around 01/12/2020) for dilate, OS, OCT.  There are no Patient Instructions on file for this visit.   Explained the diagnoses, plan, and follow up with the patient and they expressed understanding.  Patient expressed understanding of the importance of proper follow up care.   Alford Highland Yunus Stoklosa M.D. Diseases & Surgery of the Retina and Vitreous [facility] 12/08/19     Abbreviations: M myopia (nearsighted); A astigmatism; H hyperopia (farsighted); P presbyopia; Mrx spectacle prescription;  CTL contact lenses; OD right eye; OS left eye; OU both eyes  XT exotropia; ET esotropia; PEK punctate epithelial keratitis; PEE punctate epithelial erosions; DES dry eye syndrome; MGD meibomian gland dysfunction; ATs artificial tears; PFAT's preservative free artificial tears; NSC nuclear sclerotic cataract; PSC posterior subcapsular cataract; ERM epi-retinal  membrane; PVD posterior vitreous detachment; RD retinal detachment; DM diabetes mellitus; DR diabetic retinopathy; NPDR non-proliferative diabetic retinopathy; PDR proliferative diabetic retinopathy; CSME clinically significant macular edema; DME diabetic macular edema; dbh dot blot hemorrhages; CWS cotton wool spot; POAG primary open angle glaucoma; C/D cup-to-disc ratio; HVF humphrey visual field; GVF goldmann visual field; OCT optical coherence tomography; IOP intraocular pressure; BRVO Branch retinal vein occlusion; CRVO central retinal vein occlusion; CRAO central retinal artery occlusion; BRAO branch retinal artery occlusion; RT retinal tear; SB scleral buckle; PPV pars plana vitrectomy; VH Vitreous hemorrhage; PRP panretinal laser photocoagulation; IVK intravitreal kenalog; VMT vitreomacular traction; MH Macular hole;  NVD neovascularization of the disc; NVE neovascularization elsewhere; AREDS age related eye disease study; ARMD age related macular degeneration; POAG primary open angle glaucoma; EBMD epithelial/anterior basement membrane dystrophy; ACIOL anterior chamber intraocular lens; IOL intraocular lens; PCIOL posterior chamber intraocular lens; Phaco/IOL phacoemulsification with intraocular lens placement; PRK photorefractive keratectomy; LASIK laser assisted in situ keratomileusis; HTN hypertension; DM diabetes mellitus; COPD chronic obstructive pulmonary disease

## 2019-12-08 NOTE — Assessment & Plan Note (Addendum)
This is clearly present and is likely playing a role in the vitreal macular traction particularly since the patient had a vitreous strand anteriorly that could have been triggering IOL/vitreous chafe or possible vitreal ciliary body chafe  OS we will plan vitrectomy left eye.

## 2019-12-08 NOTE — Assessment & Plan Note (Signed)
New and slow onset post restoration of pseudophakia left eye via posterior chamber intraocular lens placement via Yamani scleral tunnel.  Most likely this is secondary to vitreal macular traction's.

## 2020-01-12 ENCOUNTER — Encounter (INDEPENDENT_AMBULATORY_CARE_PROVIDER_SITE_OTHER): Payer: Self-pay | Admitting: Ophthalmology

## 2020-01-12 ENCOUNTER — Other Ambulatory Visit: Payer: Self-pay

## 2020-01-12 ENCOUNTER — Ambulatory Visit (INDEPENDENT_AMBULATORY_CARE_PROVIDER_SITE_OTHER): Payer: Medicare HMO | Admitting: Ophthalmology

## 2020-01-12 DIAGNOSIS — H35352 Cystoid macular degeneration, left eye: Secondary | ICD-10-CM

## 2020-01-12 MED ORDER — KETOROLAC TROMETHAMINE 0.5 % OP SOLN: 1.0000 [drp] | Freq: Four times a day (QID) | OPHTHALMIC | 4 refills | Status: DC

## 2020-01-12 NOTE — Progress Notes (Signed)
01/12/2020     CHIEF COMPLAINT Patient presents for Retina Follow Up   HISTORY OF PRESENT ILLNESS: Jesse Torres is a 69 y.o. male who presents to the clinic today for:   HPI    Retina Follow Up    Patient presents with  Other.  In left eye.  This started 5 weeks ago.  Severity is mild.  Duration of 5 weeks.  Since onset it is stable.          Comments    5 Week F/U OS  Pt sts VA OS "might be a little better." Pt sts he ran out of ketorolac drops the day before Thanksgiving, but sts there were no more refills available.        Last edited by Ileana Roup, COA on 01/12/2020  8:51 AM. (History)      Referring physician: Dessa Phi, MD No address on file  HISTORICAL INFORMATION:   Selected notes from the MEDICAL RECORD NUMBER    Lab Results  Component Value Date   HGBA1C 6.9 (H) 07/29/2019     CURRENT MEDICATIONS: Current Outpatient Medications (Ophthalmic Drugs)  Medication Sig  . ketorolac (ACULAR) 0.5 % ophthalmic solution Place 1 drop into the left eye 4 (four) times daily.  Marland Kitchen PROLENSA 0.07 % SOLN  (Patient not taking: Reported on 12/08/2019)   No current facility-administered medications for this visit. (Ophthalmic Drugs)   Current Outpatient Medications (Other)  Medication Sig  . acetaminophen (TYLENOL) 500 MG tablet Take 1 tablet (500 mg total) by mouth every 6 (six) hours as needed.  . carvedilol (COREG) 12.5 MG tablet TAKE 1 TABLET TWICE DAILY WITH MEALS  . furosemide (LASIX) 20 MG tablet TAKE 1 TABLET EVERY DAY  . glucose blood (ACCU-CHEK GUIDE) test strip Use as instructed twice per day and as directed  . ibuprofen (ADVIL) 200 MG tablet Take 400 mg by mouth every 6 (six) hours as needed for mild pain.  Marland Kitchen lisinopril (ZESTRIL) 40 MG tablet TAKE 1 TABLET TWICE DAILY  . simvastatin (ZOCOR) 5 MG tablet TAKE 1 TABLET EVERY DAY  . spironolactone (ALDACTONE) 25 MG tablet TAKE 1/2 TABLET EVERY DAY   No current facility-administered medications  for this visit. (Other)      REVIEW OF SYSTEMS:    ALLERGIES No Known Allergies  PAST MEDICAL HISTORY Past Medical History:  Diagnosis Date  . 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  . Chronic systolic CHF (congestive heart failure) (HCC)    a. Echocardiogram (02/05/2013): Mild LVH, EF 25-30%, diffuse HK, mild MR, mild LAE, mild RVE, moderately reduced RVSF, mild RAE, PASP 64, trivial effusion.;  b.  Echo (04/2013): EF 30-35%, diffuse HK, Gr 2 DD, mod LAE, mildly reduced RVSF, mild RAE, PASP 46  . Ejection fraction < 50%   . Hypertension   . Pulmonary HTN (HCC)   . Snoring    needs sleep study  . Thigh shingles 01/04/2015   R L2 dermatome   . Thrombocytopenia (HCC)    Past Surgical History:  Procedure Laterality Date  . Cataract surgery      FAMILY HISTORY Family History  Problem Relation Age of Onset  . Heart attack Mother   . Stroke Father     SOCIAL HISTORY Social History   Tobacco Use  . Smoking status: Former Smoker    Types: Cigarettes    Quit date: 02/06/1995  Years since quitting: 24.9  . Smokeless tobacco: Never Used  Vaping Use  . Vaping Use: Unknown  Substance Use Topics  . Alcohol use: No  . Drug use: No         OPHTHALMIC EXAM:  Base Eye Exam    Visual Acuity (ETDRS)      Right Left   Dist [place] 20/20 -2 20/400   Dist ph [place]  20/200 -1       Tonometry (Tonopen, 8:51 AM)      Right Left   Pressure 16 18       Pupils      Pupils Dark Light Shape React APD   Right PERRL 4 3 Round Brisk None   Left PERRL 4 3 Round Brisk None       Visual Fields (Counting fingers)      Left Right    Full Full       Extraocular Movement      Right Left    Full Full       Neuro/Psych    Oriented x3: Yes   Mood/Affect: Normal       Dilation    Left eye: 1.0% Mydriacyl, 2.5% Phenylephrine @ 8:55 AM        Slit Lamp  and Fundus Exam    Slit Lamp Exam      Right Left   Lids/Lashes Normal Normal   Conjunctiva/Sclera White and quiet Long haptic tags superonasal and inferotemporal subconjunctival, good coverage   Cornea Clear Clear   Anterior Chamber Deep and quiet Deep and quiet   Iris Round and reactive Inferior PI stable   Lens Clear Centered posterior chamber intraocular lens   Anterior Vitreous Normal Normal       Fundus Exam      Right Left   Posterior Vitreous  Normal   Disc  Normal   C/D Ratio  0.35   Macula  Cystoid macular edema, Macular thickening   Vessels  Normal   Periphery  Normal          IMAGING AND PROCEDURES  Imaging and Procedures for 01/12/20  OCT, Retina - OU - Both Eyes       Right Eye Quality was good. Scan locations included subfoveal. Central Foveal Thickness: 269. Findings include normal foveal contour.   Left Eye Quality was good. Central Foveal Thickness: 954. Findings include abnormal foveal contour, cystoid macular edema.   Notes OS, mildly improved CME status post injection subtenon's Kenalog OS in addition to topical NSAIDs                ASSESSMENT/PLAN:  Cystoid macular edema of left eye Overall improved CME yet still very active, will repeat emanation in 2 weeks as the subtenon's Kenalog effect wanes.  Patient instructed to maintain his continued use of topical NSAIDs 4 times daily additionally. Multiple refills were provided today      ICD-10-CM   1. Cystoid macular edema of left eye  H35.352 OCT, Retina - OU - Both Eyes    1. Pseudophakic cystoid macular edema left eye, late onset, possible some role of iris IOL chafe. Will reinstitute topical therapy of topical Ketorolac OS 4 times daily. 2. Patient instructed again not to rub or compress or mash the eye.  3. Dilate OS in 2 weeks for consideration of repeat retroseptal injection subtenon's Kenalog  Ophthalmic Meds Ordered this visit:  Meds ordered this encounter  Medications    . ketorolac (ACULAR) 0.5 % ophthalmic solution  Sig: Place 1 drop into the left eye 4 (four) times daily.    Dispense:  5 mL    Refill:  4       Return in about 2 weeks (around 01/26/2020), or ,, Subtenon's Kenalog OS as well, for dilate, OS, OCT.  There are no Patient Instructions on file for this visit.   Explained the diagnoses, plan, and follow up with the patient and they expressed understanding.  Patient expressed understanding of the importance of proper follow up care.   Alford Highland Sharrieff Spratlin M.D. Diseases & Surgery of the Retina and Vitreous [facility] 01/12/20     Abbreviations: M myopia (nearsighted); A astigmatism; H hyperopia (farsighted); P presbyopia; Mrx spectacle prescription;  CTL contact lenses; OD right eye; OS left eye; OU both eyes  XT exotropia; ET esotropia; PEK punctate epithelial keratitis; PEE punctate epithelial erosions; DES dry eye syndrome; MGD meibomian gland dysfunction; ATs artificial tears; PFAT's preservative free artificial tears; NSC nuclear sclerotic cataract; PSC posterior subcapsular cataract; ERM epi-retinal membrane; PVD posterior vitreous detachment; RD retinal detachment; DM diabetes mellitus; DR diabetic retinopathy; NPDR non-proliferative diabetic retinopathy; PDR proliferative diabetic retinopathy; CSME clinically significant macular edema; DME diabetic macular edema; dbh dot blot hemorrhages; CWS cotton wool spot; POAG primary open angle glaucoma; C/D cup-to-disc ratio; HVF humphrey visual field; GVF goldmann visual field; OCT optical coherence tomography; IOP intraocular pressure; BRVO Branch retinal vein occlusion; CRVO central retinal vein occlusion; CRAO central retinal artery occlusion; BRAO branch retinal artery occlusion; RT retinal tear; SB scleral buckle; PPV pars plana vitrectomy; VH Vitreous hemorrhage; PRP panretinal laser photocoagulation; IVK intravitreal kenalog; VMT vitreomacular traction; MH Macular hole;  NVD  neovascularization of the disc; NVE neovascularization elsewhere; AREDS age related eye disease study; ARMD age related macular degeneration; POAG primary open angle glaucoma; EBMD epithelial/anterior basement membrane dystrophy; ACIOL anterior chamber intraocular lens; IOL intraocular lens; PCIOL posterior chamber intraocular lens; Phaco/IOL phacoemulsification with intraocular lens placement; PRK photorefractive keratectomy; LASIK laser assisted in situ keratomileusis; HTN hypertension; DM diabetes mellitus; COPD chronic obstructive pulmonary disease

## 2020-01-12 NOTE — Assessment & Plan Note (Signed)
Overall improved CME yet still very active, will repeat emanation in 2 weeks as the subtenon's Kenalog effect wanes.  Patient instructed to maintain his continued use of topical NSAIDs 4 times daily additionally. Multiple refills were provided today

## 2020-01-13 NOTE — Progress Notes (Signed)
CARDIOLOGY OFFICE NOTE  Date:  01/27/2020    [name] Date of Birth: 28-Oct-1950 Medical Record #[id number]  PCP:  Dessa Phi, MD  Cardiologist:  Tyrone Sage   Chief Complaint  Patient presents with  . Follow-up    History of Present Illness: Jesse Torres is a 69 y.o. male who presents today for a follow up visit. Former patient of Dr. Henrietta Hoover. Has followed with me.    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 Initially his ejection fraction was noted at 35%. There was no ischemia by past nuclear scan. Cardiac catheterization was never done. He is been very compliant with his medications and has had normalization of left ventricular function - last echo from March of 2016. Other issues as noted below and include CKD - which has caused [place] to cut Aldactone back, chronic thrombocytopenia. He has declined referral to hematology despite multiple discussions. We have stopped his aspirin - he has no known CAD.    Last seen in June and was felt to be doing well. Has been vaccinated for COVID 19. No real worries on his part.   Comes in today. Here alone. He is staying busy. Feels good. Has had eye surgery but with some residual vision issues - for repeat surgery next month and this should improve. If he has lower extremity swelling - it will typically go away overnight. No chest pain. Not short of breath. Will get tired but he stays very busy. He "does not have time to sit down" during the day. "If you sit down, you go down". He is doing yards, Magazine features editor, etc.  He is trying to work on his weight. Will check his blood pressure occasionally at his sister's - lower readings noted. He does not have his own cuff. Has just had his AM medicines. He is fasting today. He does take some occasional Advil/Aleve. Some easing bruising but no bleeding that he cannot stop.   Past Medical History:  Diagnosis Date  . Cardiomyopathy (HCC)    probable HTN cardiomyopathy;  Lexiscan Myoview  (02/07/2013): EF 34%, old small apical infarct, no ischemia, moderate apical hypokinesis.  . Chronic systolic CHF (congestive heart failure) (HCC)    a. Echocardiogram (02/05/2013): Mild LVH, EF 25-30%, diffuse HK, mild MR, mild LAE, mild RVE, moderately reduced RVSF, mild RAE, PASP 64, trivial effusion.;  b.  Echo (04/2013): EF 30-35%, diffuse HK, Gr 2 DD, mod LAE, mildly reduced RVSF, mild RAE, PASP 46  . Ejection fraction < 50%   . Hypertension   . Pulmonary HTN (HCC)   . Snoring    needs sleep study  . Thigh shingles 01/04/2015   R L2 dermatome   . Thrombocytopenia (HCC)     Past Surgical History:  Procedure Laterality Date  . Cataract surgery       Medications: Current Meds  Medication Sig  . acetaminophen (TYLENOL) 500 MG tablet Take 1 tablet (500 mg total) by mouth every 6 (six) hours as needed.  . carvedilol (COREG) 12.5 MG tablet TAKE 1 TABLET TWICE DAILY WITH MEALS  . furosemide (LASIX) 20 MG tablet TAKE 1 TABLET EVERY DAY  . glucose blood (ACCU-CHEK GUIDE) test strip Use as instructed twice per day and as directed  . ketorolac (ACULAR) 0.5 % ophthalmic solution Place 1 drop into the left eye 4 (four) times daily.  Marland Kitchen lisinopril (ZESTRIL) 40 MG tablet TAKE 1 TABLET TWICE DAILY  . prednisoLONE acetate (PRED FORTE) 1 % ophthalmic suspension   .  PROLENSA 0.07 % SOLN   . simvastatin (ZOCOR) 5 MG tablet TAKE 1 TABLET EVERY DAY  . spironolactone (ALDACTONE) 25 MG tablet TAKE 1/2 TABLET EVERY DAY  . [DISCONTINUED] ibuprofen (ADVIL) 200 MG tablet Take 400 mg by mouth every 6 (six) hours as needed for mild pain.     Allergies: No Known Allergies  Social History: The patient  reports that he quit smoking about 24 years ago. His smoking use included cigarettes. He has never used smokeless tobacco. He reports that he does not drink alcohol and does not use drugs.   Family History: The patient's family history includes Heart attack in his mother; Stroke in his father.   Review  of Systems: Please see the history of present illness.   All other systems are reviewed and negative.   Physical Exam: VS:  BP (!) 130/92   Pulse 67   Ht 5\' 7"  (1.702 m)   Wt 223 lb (101.2 kg)   SpO2 99%   BMI 34.93 kg/m  .  BMI Body mass index is 34.93 kg/m.  Wt Readings from Last 3 Encounters:  01/27/20 223 lb (101.2 kg)  07/29/19 226 lb 12.8 oz (102.9 kg)  07/18/19 220 lb (99.8 kg)    General: Pleasant. Well developed, well nourished and in no acute distress.   HEENT: Normal.  Neck: Supple, no JVD, carotid bruits, or masses noted.  Cardiac: Regular rate and rhythm. No murmurs, rubs, or gallops. No edema.  Respiratory:  Lungs are clear to auscultation bilaterally with normal work of breathing.  GI: Soft and nontender.  MS: No deformity or atrophy. Gait and ROM intact.  Skin: Warm and dry. Color is normal.  Neuro:  Strength and sensation are intact and no gross focal deficits noted.  Psych: Alert, appropriate and with normal affect.   LABORATORY DATA:  EKG:  EKG is ordered today.  Personally reviewed by me. This demonstrates NSR with PVC and incomplete RBBB. HR is 67.   Lab Results  Component Value Date   WBC 5.1 07/29/2019   HGB 14.5 07/29/2019   HCT 43.8 07/29/2019   PLT 87 (LL) 07/29/2019   GLUCOSE 175 (H) 07/29/2019   CHOL 98 (L) 07/29/2019   TRIG 112 07/29/2019   HDL 32 (L) 07/29/2019   LDLCALC 45 07/29/2019   ALT 19 07/29/2019   AST 19 07/29/2019   NA 136 07/29/2019   K 5.0 07/29/2019   CL 98 07/29/2019   CREATININE 1.11 07/29/2019   BUN 16 07/29/2019   CO2 26 07/29/2019   TSH 3.540 07/24/2017   HGBA1C 6.9 (H) 07/29/2019     BNP (last 3 results) No results for input(s): BNP in the last 8760 hours.  ProBNP (last 3 results) No results for input(s): PROBNP in the last 8760 hours.   Other Studies Reviewed Today:   Echo Study Conclusions 04/2014  - Left ventricle: The cavity size was normal. Wall thickness was   normal. Systolic function was  normal. The estimated ejection   fraction was in the range of 60% to 65%. Wall motion was normal;   there were no regional wall motion abnormalities. - Left atrium: The atrium was mildly dilated. - Pulmonary arteries: Systolic pressure was mildly increased. PA   peak pressure: 33 mm Hg (S).     ASSESSMENT & PLAN:     1. NICM/hypertensive CM/chronic systolic HF - EF recovered per prior echo - he is on good guideline therapy with ACE/beta blocker/diuretic and Aldactone - NYHA I/II -  lab today - no changes made.   2. HTN - BP is 148/80 by me - he has just had his medicines - will give BP cuff for him to use - no changes for now - could try to increase the Aldactone back up if needed and lab ok - has had tendency towards hyperkalemia.   3. HLD - on statin - lab today.   4. Thrombocytopenia - lab today - he has not wanted to see hematology - some easing bruising noted but no bleeding.   5. CKD - lab today  6. DM - he is trying to manage with diet and is wanting to work on his weight.   7. History of bradycardia - not symptomatic - would continue his Coreg. HR is fine today by EKG.    Current medicines are reviewed with the patient today.  The patient does not have concerns regarding medicines other than what has been noted above.  The following changes have been made:  See above.  Labs/ tests ordered today include:    Orders Placed This Encounter  Procedures  . Basic metabolic panel  . CBC  . Hepatic function panel  . Lipid panel  . Hemoglobin A1c  . EKG 12-Lead     Disposition:   FU with Dr. Shari Prows in 3 months. He is aware that I am leaving in February. Lab today. No change in medicines.    Patient is agreeable to this plan and will call if any problems develop in the interim.   SignedNorma Fredrickson, NP  01/27/2020 8:56 AM  [facility] [address] [place], [place]  [address] Phone: [phone] Fax: [phone]

## 2020-01-26 ENCOUNTER — Other Ambulatory Visit: Payer: Self-pay

## 2020-01-26 ENCOUNTER — Encounter (INDEPENDENT_AMBULATORY_CARE_PROVIDER_SITE_OTHER): Payer: Self-pay | Admitting: Ophthalmology

## 2020-01-26 ENCOUNTER — Ambulatory Visit (INDEPENDENT_AMBULATORY_CARE_PROVIDER_SITE_OTHER): Payer: Medicare HMO | Admitting: Ophthalmology

## 2020-01-26 DIAGNOSIS — H43812 Vitreous degeneration, left eye: Secondary | ICD-10-CM | POA: Insufficient documentation

## 2020-01-26 DIAGNOSIS — H35352 Cystoid macular degeneration, left eye: Secondary | ICD-10-CM

## 2020-01-26 HISTORY — DX: Vitreous degeneration, left eye: H43.812

## 2020-01-26 MED ORDER — TRIAMCINOLONE ACETONIDE 40 MG/ML IJ SUSP FOR KALEIDOSCOPE
4.0000 mg | INTRAMUSCULAR | Status: AC | PRN
  Administered 2020-01-26: 4 mg via INTRAVITREAL

## 2020-01-26 NOTE — Assessment & Plan Note (Signed)
New and recent and with increasing CME, likely some triggering cause of the phakic cystoid macular edema with iris and/or iris IOL vitreous chafe

## 2020-01-26 NOTE — Assessment & Plan Note (Addendum)
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  Now spontaneous PVD present  Will deliver retroseptal Kenalog OS today to protect the ocular structures from progression of CME while planning for vitrectomy to release the vitreous as well as minor repositioning of Zeiss CT Lucio lens via haptic manipulation to lengthen the intraocular portions of the haptics

## 2020-01-26 NOTE — Progress Notes (Signed)
01/26/2020     CHIEF COMPLAINT Patient presents for Retina Follow Up (2 WK FU OS, POSS SUBTENON KENALOG INJECTION OS////Pt reports blurry vision that comes and goes OS, no new f/f, no pain or pressure. ////Last A1C: unknown, unsure of when taken/////Last BS: 154 this AM )   HISTORY OF PRESENT ILLNESS: Jesse Torres is a 69 y.o. male who presents to the clinic today for:   HPI    Retina Follow Up    Patient presents with  Other.  In left eye.  This started 2 weeks ago.  Duration of 2 weeks. Additional comments: 2 WK FU OS, POSS SUBTENON KENALOG INJECTION OS    Pt reports blurry vision that comes and goes OS, no new f/f, no pain or pressure.     Last A1C: unknown, unsure of when taken     Last BS: 154 this AM        Last edited by Varney Biles D on 01/26/2020  9:15 AM. (History)      Referring physician: Dessa Phi, MD No address on file  HISTORICAL INFORMATION:   Selected notes from the MEDICAL RECORD NUMBER    Lab Results  Component Value Date   HGBA1C 6.9 (H) 07/29/2019     CURRENT MEDICATIONS: Current Outpatient Medications (Ophthalmic Drugs)  Medication Sig  . ketorolac (ACULAR) 0.5 % ophthalmic solution Place 1 drop into the left eye 4 (four) times daily.  Marland Kitchen PROLENSA 0.07 % SOLN  (Patient not taking: Reported on 12/08/2019)   No current facility-administered medications for this visit. (Ophthalmic Drugs)   Current Outpatient Medications (Other)  Medication Sig  . acetaminophen (TYLENOL) 500 MG tablet Take 1 tablet (500 mg total) by mouth every 6 (six) hours as needed.  . carvedilol (COREG) 12.5 MG tablet TAKE 1 TABLET TWICE DAILY WITH MEALS  . furosemide (LASIX) 20 MG tablet TAKE 1 TABLET EVERY DAY  . glucose blood (ACCU-CHEK GUIDE) test strip Use as instructed twice per day and as directed  . ibuprofen (ADVIL) 200 MG tablet Take 400 mg by mouth every 6 (six) hours as needed for mild pain.  Marland Kitchen lisinopril (ZESTRIL) 40 MG tablet TAKE 1  TABLET TWICE DAILY  . simvastatin (ZOCOR) 5 MG tablet TAKE 1 TABLET EVERY DAY  . spironolactone (ALDACTONE) 25 MG tablet TAKE 1/2 TABLET EVERY DAY   No current facility-administered medications for this visit. (Other)      REVIEW OF SYSTEMS:    ALLERGIES No Known Allergies  PAST MEDICAL HISTORY Past Medical History:  Diagnosis Date  . 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  . Chronic systolic CHF (congestive heart failure) (HCC)    a. Echocardiogram (02/05/2013): Mild LVH, EF 25-30%, diffuse HK, mild MR, mild LAE, mild RVE, moderately reduced RVSF, mild RAE, PASP 64, trivial effusion.;  b.  Echo (04/2013): EF 30-35%, diffuse HK, Gr 2 DD, mod LAE, mildly reduced RVSF, mild RAE, PASP 46  . Ejection fraction < 50%   . Hypertension   . Pulmonary HTN (HCC)   . Snoring    needs sleep study  . Thigh shingles 01/04/2015   R L2 dermatome   . Thrombocytopenia (HCC)    Past Surgical History:  Procedure Laterality Date  . Cataract surgery      FAMILY HISTORY Family History  Problem Relation Age of Onset  . Heart attack Mother   . Stroke Father  SOCIAL HISTORY Social History   Tobacco Use  . Smoking status: Former Smoker    Types: Cigarettes    Quit date: 02/06/1995    Years since quitting: 24.9  . Smokeless tobacco: Never Used  Vaping Use  . Vaping Use: Unknown  Substance Use Topics  . Alcohol use: No  . Drug use: No         OPHTHALMIC EXAM:  Base Eye Exam    Visual Acuity (ETDRS)      Right Left   Dist [place] 20/20 20/200 +1   Dist ph [place]  NI       Tonometry (Tonopen, 9:20 AM)      Right Left   Pressure 21 22       Pupils      Pupils Dark Light Shape React APD   Right PERRL 4 3 Round Brisk None   Left PERRL 4 3 Round Brisk None       Visual Fields (Counting fingers)      Left Right    Full Full       Extraocular Movement       Right Left    Full Full       Neuro/Psych    Oriented x3: Yes   Mood/Affect: Normal       Dilation    Left eye: 1.0% Mydriacyl, 2.5% Phenylephrine @ 9:20 AM        Slit Lamp and Fundus Exam    External Exam      Right Left   External Normal Normal       Slit Lamp Exam      Right Left   Lids/Lashes Normal Normal   Conjunctiva/Sclera White and quiet Long haptic tags superonasal and inferotemporal subconjunctival, good coverage   Cornea Clear Clear   Anterior Chamber Deep and quiet Deep and quiet   Iris Round and reactive Inferior PI stable   Lens Clear Centered posterior chamber intraocular lens   Anterior Vitreous Normal Normal       Fundus Exam      Right Left   Posterior Vitreous  Normal   Disc  Normal   C/D Ratio  0.35   Macula  Cystoid macular edema, Macular thickening   Vessels  Normal   Periphery  Normal          IMAGING AND PROCEDURES  Imaging and Procedures for 01/26/20  OCT, Retina - OU - Both Eyes       Right Eye Quality was good. Scan locations included subfoveal. Central Foveal Thickness: 267. Progression has been stable. Findings include normal foveal contour.   Left Eye Quality was good. Scan locations included subfoveal. Central Foveal Thickness: 915. Progression has worsened. Findings include abnormal foveal contour, cystoid macular edema.   Notes Serous subretinal fluid persist with massive cystoid macular edema not improving at 6 weeks post previous retroseptal, subtenon's Kenalog, PVD now present       Injection into Tenon's Capsule - OS - Left Eye       Time Out 01/26/2020. 9:58 AM. Confirmed correct patient, procedure, site, and patient consented.   Anesthesia No anesthesia was used.   Procedure Injection:  4 mg KENALOG-40mg /ml injection 4mg    NDC: , Lot: [id number]   Route: Intravitreal, Site: Left Eye  Post-op Post injection exam found visual acuity of at least counting fingers. The patient tolerated the  procedure well. There were no complications. The patient received written and verbal post procedure care education. Post injection medications were  not given.                 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  Now spontaneous PVD present  Will deliver retroseptal Kenalog OS today to protect the ocular structures from progression of CME while planning for vitrectomy to release the vitreous as well as minor repositioning of Zeiss CT Lucio lens via haptic manipulation to lengthen the intraocular portions of the haptics  Posterior vitreous detachment, left eye New and recent and with increasing CME, likely some triggering cause of the phakic cystoid macular edema with iris and/or iris IOL vitreous chafe      ICD-10-CM   1. Cystoid macular edema of left eye  H35.352 OCT, Retina - OU - Both Eyes    Injection into Tenon's Capsule - OS - Left Eye    triamcinolone acetonide (KENALOG-40) injection 4 mg  2. Posterior vitreous detachment, left eye  H43.812     1.  Pseudophakic cystoid macular edema left eye continues to worsen, remnants of vitreous, are now evident as a posterior vitreous detachment has developed.  At the time of original surgery taut vitreoretinal adhesion of the posterior hyaloid prevented safe removal via vitrectomy, now this may be a component of pseudophakic cystoid macular edema with iris lens vitreous chafe persisting.  In addition surgical manipulation of the haptics to shorten the externalized portion of the haptics superonasal may allow for some more standoff distance between the iris and the anterior surface of the lens.  2.  May proceed with surgical intervention in the coming weeks OS and benefits were reviewed with the  patient.  3.  Ophthalmic Meds Ordered this visit:  Meds ordered this encounter  Medications  . triamcinolone acetonide (KENALOG-40) injection 4 mg       Return H35.352,, [facility], for Schedule vitrectomy, LMAC, [address], reposition PCIOL.  There are no Patient Instructions on file for this visit.   Explained the diagnoses, plan, and follow up with the patient and they expressed understanding.  Patient expressed understanding of the importance of proper follow up care.   Alford Highland Ediel Unangst M.D. Diseases & Surgery of the Retina and Vitreous [facility] 01/26/20     Abbreviations: M myopia (nearsighted); A astigmatism; H hyperopia (farsighted); P presbyopia; Mrx spectacle prescription;  CTL contact lenses; OD right eye; OS left eye; OU both eyes  XT exotropia; ET esotropia; PEK punctate epithelial keratitis; PEE punctate epithelial erosions; DES dry eye syndrome; MGD meibomian gland dysfunction; ATs artificial tears; PFAT's preservative free artificial tears; NSC nuclear sclerotic cataract; PSC posterior subcapsular cataract; ERM epi-retinal membrane; PVD posterior vitreous detachment; RD retinal detachment; DM diabetes mellitus; DR diabetic retinopathy; NPDR non-proliferative diabetic retinopathy; PDR proliferative diabetic retinopathy; CSME clinically significant macular edema; DME diabetic macular edema; dbh dot blot hemorrhages; CWS cotton wool spot; POAG primary open angle glaucoma; C/D cup-to-disc ratio; HVF humphrey visual field; GVF goldmann visual field; OCT optical coherence tomography; IOP intraocular pressure; BRVO Branch retinal vein occlusion; CRVO central retinal vein occlusion; CRAO central retinal artery occlusion; BRAO branch retinal artery occlusion; RT retinal tear; SB scleral buckle; PPV pars plana vitrectomy; VH Vitreous hemorrhage; PRP panretinal laser photocoagulation; IVK intravitreal kenalog; VMT vitreomacular traction; MH Macular hole;   NVD neovascularization of the disc; NVE  neovascularization elsewhere; AREDS age related eye disease study; ARMD age related macular degeneration; POAG primary open angle glaucoma; EBMD epithelial/anterior basement membrane dystrophy; ACIOL anterior chamber intraocular lens; IOL intraocular lens; PCIOL posterior chamber intraocular lens; Phaco/IOL phacoemulsification with intraocular lens placement; Morton photorefractive keratectomy; LASIK laser assisted in situ keratomileusis; HTN hypertension; DM diabetes mellitus; COPD chronic obstructive pulmonary disease

## 2020-01-27 ENCOUNTER — Encounter: Payer: Self-pay | Admitting: Nurse Practitioner

## 2020-01-27 ENCOUNTER — Ambulatory Visit: Payer: Medicare HMO | Admitting: Nurse Practitioner

## 2020-01-27 VITALS — BP 130/92 | HR 67 | Ht 67.0 in | Wt 223.0 lb

## 2020-01-27 DIAGNOSIS — I5022 Chronic systolic (congestive) heart failure: Secondary | ICD-10-CM | POA: Diagnosis not present

## 2020-01-27 DIAGNOSIS — Z79899 Other long term (current) drug therapy: Secondary | ICD-10-CM

## 2020-01-27 DIAGNOSIS — E7800 Pure hypercholesterolemia, unspecified: Secondary | ICD-10-CM

## 2020-01-27 DIAGNOSIS — I10 Essential (primary) hypertension: Secondary | ICD-10-CM | POA: Diagnosis not present

## 2020-01-27 DIAGNOSIS — D696 Thrombocytopenia, unspecified: Secondary | ICD-10-CM | POA: Diagnosis not present

## 2020-01-27 DIAGNOSIS — E0865 Diabetes mellitus due to underlying condition with hyperglycemia: Secondary | ICD-10-CM

## 2020-01-27 NOTE — Patient Instructions (Addendum)
After Visit Summary:  We will be checking the following labs today - BMET, CBC, HPF, Lipids and A1C   Medication Instructions:    Continue with your current medicines. BUT  STOP Advil/Aleve  OK to use Tylenol/Tylenol Arthritis instead   If you need a refill on your cardiac medications before your next appointment, please call your pharmacy.     Testing/Procedures To Be Arranged:  N/A  Follow-Up:   See Dr. Shari Prows in 3 months as new patient.     At [facility], you and your health needs are our priority.  As part of our continuing mission to provide you with exceptional heart care, we have created designated Provider Care Teams.  These Care Teams include your primary Cardiologist (physician) and Advanced Practice Providers (APPs -  Physician Assistants and Nurse Practitioners) who all work together to provide you with the care you need, when you need it.  Special Instructions:  . Stay safe, wash your hands for at least 20 seconds and wear a mask when needed.  . It was good to talk with you today.  . Try to keep a check on your BP for [place] - we will try to give you a cuff of your own today.    Call the [facility] office at [phone] if you have any questions, problems or concerns.

## 2020-01-29 LAB — BASIC METABOLIC PANEL
BUN/Creatinine Ratio: 19 (ref 10–24)
BUN: 23 mg/dL (ref 8–27)
CO2: 24 mmol/L (ref 20–29)
Calcium: 9.8 mg/dL (ref 8.6–10.2)
Chloride: 97 mmol/L (ref 96–106)
Creatinine, Ser: 1.19 mg/dL (ref 0.76–1.27)
GFR calc Af Amer: 72 mL/min/{1.73_m2} (ref >59)
GFR calc non Af Amer: 62 mL/min/{1.73_m2} (ref >59)
Glucose: 143 mg/dL — ABNORMAL HIGH (ref 65–99)
Potassium: 4.6 mmol/L (ref 3.5–5.2)
Sodium: 134 mmol/L (ref 134–144)

## 2020-01-29 LAB — HEPATIC FUNCTION PANEL
ALT: 11 IU/L (ref 0–44)
AST: 10 IU/L (ref 0–40)
Albumin: 4.2 g/dL (ref 3.8–4.8)
Alkaline Phosphatase: 80 IU/L (ref 44–121)
Bilirubin Total: 1.1 mg/dL (ref 0.0–1.2)
Bilirubin, Direct: 0.33 mg/dL (ref 0.00–0.40)
Total Protein: 6.9 g/dL (ref 6.0–8.5)

## 2020-01-29 LAB — LIPID PANEL
Chol/HDL Ratio: 3.4 ratio (ref 0.0–5.0)
Cholesterol, Total: 119 mg/dL (ref 100–199)
HDL: 35 mg/dL — ABNORMAL LOW (ref >39)
LDL Chol Calc (NIH): 65 mg/dL (ref 0–99)
Triglycerides: 101 mg/dL (ref 0–149)
VLDL Cholesterol Cal: 19 mg/dL (ref 5–40)

## 2020-01-29 LAB — HEMOGLOBIN A1C
Est. average glucose Bld gHb Est-mCnc: 140 mg/dL
Hgb A1c MFr Bld: 6.5 % — ABNORMAL HIGH (ref 4.8–5.6)

## 2020-01-29 LAB — CBC
Hematocrit: 48.2 % (ref 37.5–51.0)
Hemoglobin: 15.9 g/dL (ref 13.0–17.7)
MCH: 28.6 pg (ref 26.6–33.0)
MCHC: 33 g/dL (ref 31.5–35.7)
MCV: 87 fL (ref 79–97)
Platelets: 94 10*3/uL — CL (ref 150–450)
RBC: 5.55 x10E6/uL (ref 4.14–5.80)
RDW: 15.5 % — ABNORMAL HIGH (ref 11.6–15.4)
WBC: 6.2 10*3/uL (ref 3.4–10.8)

## 2020-02-11 ENCOUNTER — Encounter (INDEPENDENT_AMBULATORY_CARE_PROVIDER_SITE_OTHER): Payer: Self-pay

## 2020-02-11 ENCOUNTER — Other Ambulatory Visit: Payer: Self-pay

## 2020-02-11 ENCOUNTER — Ambulatory Visit (INDEPENDENT_AMBULATORY_CARE_PROVIDER_SITE_OTHER): Payer: Medicare HMO

## 2020-02-11 DIAGNOSIS — H35352 Cystoid macular degeneration, left eye: Secondary | ICD-10-CM

## 2020-02-11 MED ORDER — PREDNISOLONE ACETATE 1 % OP SUSP
1.0000 [drp] | Freq: Four times a day (QID) | OPHTHALMIC | 0 refills | Status: DC
Start: 2020-02-16 — End: 2020-02-19

## 2020-02-11 MED ORDER — OFLOXACIN 0.3 % OP SOLN
1.0000 [drp] | Freq: Four times a day (QID) | OPHTHALMIC | 0 refills | Status: DC
Start: 2020-02-16 — End: 2020-02-19

## 2020-02-11 NOTE — Progress Notes (Signed)
02/11/2020     CHIEF COMPLAINT Patient presents for No chief complaint on file.   HISTORY OF PRESENT ILLNESS: Jesse Torres is a 69 y.o. male who presents to the clinic today for:   HPI    Preop   Last edited by Varney Biles D on 02/11/2020  1:32 PM. (History)        HISTORICAL INFORMATION:   Selected notes from the MEDICAL RECORD NUMBER    Lab Results  Component Value Date   HGBA1C 6.5 (H) 01/27/2020     CURRENT MEDICATIONS: Current Outpatient Medications (Ophthalmic Drugs)  Medication Sig  . ketorolac (ACULAR) 0.5 % ophthalmic solution Place 1 drop into the left eye 4 (four) times daily.  Melene Muller ON 02/16/2020] ofloxacin (OCUFLOX) 0.3 % ophthalmic solution Place 1 drop into the left eye 4 (four) times daily for 21 days.  Melene Muller ON 02/16/2020] prednisoLONE acetate (PRED FORTE) 1 % ophthalmic suspension Place 1 drop into the left eye 4 (four) times daily for 21 days.  . prednisoLONE acetate (PRED FORTE) 1 % ophthalmic suspension  (Patient not taking: Reported on 02/11/2020)  . PROLENSA 0.07 % SOLN  (Patient not taking: Reported on 02/11/2020)   No current facility-administered medications for this visit. (Ophthalmic Drugs)   Current Outpatient Medications (Other)  Medication Sig  . acetaminophen (TYLENOL) 500 MG tablet Take 1 tablet (500 mg total) by mouth every 6 (six) hours as needed.  . carvedilol (COREG) 12.5 MG tablet TAKE 1 TABLET TWICE DAILY WITH MEALS  . furosemide (LASIX) 20 MG tablet TAKE 1 TABLET EVERY DAY  . glucose blood (ACCU-CHEK GUIDE) test strip Use as instructed twice per day and as directed  . lisinopril (ZESTRIL) 40 MG tablet TAKE 1 TABLET TWICE DAILY  . simvastatin (ZOCOR) 5 MG tablet TAKE 1 TABLET EVERY DAY  . spironolactone (ALDACTONE) 25 MG tablet TAKE 1/2 TABLET EVERY DAY   No current facility-administered medications for this visit. (Other)     ALLERGIES No Known Allergies  PAST MEDICAL HISTORY Past Medical History:   Diagnosis Date  . 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  . Chronic systolic CHF (congestive heart failure) (HCC)    a. Echocardiogram (02/05/2013): Mild LVH, EF 25-30%, diffuse HK, mild MR, mild LAE, mild RVE, moderately reduced RVSF, mild RAE, PASP 64, trivial effusion.;  b.  Echo (04/2013): EF 30-35%, diffuse HK, Gr 2 DD, mod LAE, mildly reduced RVSF, mild RAE, PASP 46  . Ejection fraction < 50%   . Hypertension   . Pulmonary HTN (HCC)   . Snoring    needs sleep study  . Thigh shingles 01/04/2015   R L2 dermatome   . Thrombocytopenia (HCC)    Past Surgical History:  Procedure Laterality Date  . Cataract surgery      FAMILY HISTORY Family History  Problem Relation Age of Onset  . Heart attack Mother   . Stroke Father     SOCIAL HISTORY Social History   Tobacco Use  . Smoking status: Former Smoker    Types: Cigarettes    Quit date: 02/06/1995    Years since quitting: 25.0  . Smokeless tobacco: Never Used  Vaping Use  . Vaping Use: Unknown  Substance Use Topics  . Alcohol use: No  . Drug use: No         OPHTHALMIC EXAM: Base Eye Exam    Visual Acuity (  ETDRS)      Right Left   Dist [place] 20/20 20/200   Dist ph [place]  NI       Tonometry (Tonopen, 1:37 PM)      Right Left   Pressure 27 24       Pupils      Dark Light Shape React APD   Right 4 3 Round Brisk None   Left 4 3 Round Brisk None       Visual Fields (Counting fingers)      Left Right    Full Full       Extraocular Movement      Right Left    Full Full       Neuro/Psych    Oriented x3: Yes          IMAGING AND PROCEDURES  Imaging and Procedures for @TODAY @           ASSESSMENT/PLAN:  No diagnosis found.  Ophthalmic Meds Ordered this visit:  Meds ordered this encounter  Medications  . prednisoLONE acetate (PRED FORTE) 1 % ophthalmic suspension    Sig:  Place 1 drop into the left eye 4 (four) times daily for 21 days.    Dispense:  5 mL    Refill:  0  . ofloxacin (OCUFLOX) 0.3 % ophthalmic solution    Sig: Place 1 drop into the left eye 4 (four) times daily for 21 days.    Dispense:  5 mL    Refill:  0        Pre-op completed. Operative consent obtained with pre-op eye drops reviewed with and sent via [facility] as needed. Post op instructions reviewed with patient and per patient all questions answered.  [facility] Amauri Keefe

## 2020-02-18 ENCOUNTER — Encounter (AMBULATORY_SURGERY_CENTER): Payer: Medicare HMO | Admitting: Ophthalmology

## 2020-02-18 DIAGNOSIS — H43312 Vitreous membranes and strands, left eye: Secondary | ICD-10-CM | POA: Diagnosis not present

## 2020-02-18 DIAGNOSIS — H35352 Cystoid macular degeneration, left eye: Secondary | ICD-10-CM

## 2020-02-18 DIAGNOSIS — T8522XA Displacement of intraocular lens, initial encounter: Secondary | ICD-10-CM | POA: Diagnosis not present

## 2020-02-18 DIAGNOSIS — H43822 Vitreomacular adhesion, left eye: Secondary | ICD-10-CM | POA: Diagnosis not present

## 2020-02-19 ENCOUNTER — Ambulatory Visit (INDEPENDENT_AMBULATORY_CARE_PROVIDER_SITE_OTHER): Payer: Medicare HMO | Admitting: Ophthalmology

## 2020-02-19 ENCOUNTER — Other Ambulatory Visit: Payer: Self-pay

## 2020-02-19 ENCOUNTER — Encounter (INDEPENDENT_AMBULATORY_CARE_PROVIDER_SITE_OTHER): Payer: Self-pay | Admitting: Ophthalmology

## 2020-02-19 DIAGNOSIS — H43312 Vitreous membranes and strands, left eye: Secondary | ICD-10-CM | POA: Insufficient documentation

## 2020-02-19 DIAGNOSIS — H35352 Cystoid macular degeneration, left eye: Secondary | ICD-10-CM

## 2020-02-19 HISTORY — DX: Vitreous membranes and strands, left eye: H43.312

## 2020-02-19 MED ORDER — KETOROLAC TROMETHAMINE 0.5 % OP SOLN: 1.0000 [drp] | Freq: Four times a day (QID) | OPHTHALMIC | 4 refills | Status: DC

## 2020-02-19 MED ORDER — PREDNISOLONE ACETATE 1 % OP SUSP: 1.0000 [drp] | Freq: Four times a day (QID) | OPHTHALMIC | 0 refills | Status: AC

## 2020-02-19 MED ORDER — PREDNISOLONE ACETATE 1 % OP SUSP: 1.0000 [drp] | Freq: Four times a day (QID) | OPHTHALMIC | 0 refills | Status: DC

## 2020-02-19 MED ORDER — OFLOXACIN 0.3 % OP SOLN: 1.0000 [drp] | Freq: Four times a day (QID) | OPHTHALMIC | 0 refills | Status: AC

## 2020-02-19 NOTE — Patient Instructions (Signed)
Patient instructed to resume topical medications left eye only  Ketorolac 1 drop left eye 4 times daily  Prednisolone acetate 1 drop left eye 4 times daily  Ofloxacin 1 drop left eye 4 times daily  Prescriptions were sent to [facility]

## 2020-02-19 NOTE — Assessment & Plan Note (Signed)
Looks great OS today, postop day #1

## 2020-02-19 NOTE — Progress Notes (Signed)
02/19/2020     CHIEF COMPLAINT Patient presents for Post-op Follow-up (1 Day PO OS, post vitrectomy for membranes and strands, CME OS as well as slight malposition of Yamani tunnel fixated three-piece IOL in the posterior chamber, and allow for repositioning of IOL by depositing 1.5 mm of the haptic internally to the scleral tunnel, with the intention of increasing the distance of the iris to anterior surface of the optic)   HISTORY OF PRESENT ILLNESS: Jesse Torres is a 70 y.o. male who presents to the clinic today for:   HPI    Post-op Follow-up    In left eye.  Discomfort includes pain, foreign body sensation and tearing. Additional comments: 1 Day PO OS, post vitrectomy for membranes and strands, CME OS as well as slight malposition of Yamani tunnel fixated three-piece IOL in the posterior chamber, and allow for repositioning of IOL by depositing 1.5 mm of the haptic internally to the scleral tunnel, with the intention of increasing the distance of the iris to anterior surface of the optic          Comments    1 Day PO OS, post vitrectomy for membranes and strands, CME OS as well as slight malposition of Yamani tunnel fixated three-piece IOL in the posterior chamber, and allow for repositioning of IOL by depositing 1.5 mm of the haptic internally to the scleral tunnel, with the intention of increasing the distance of the iris to anterior surface of the optic,,  At the time of surgery vitrectomy was successful at removing a cortical vitreous layer attached to the retina OS in addition I was able to dissected        Last edited by Edmon Crape, MD on 02/19/2020  8:49 AM. (History)      Referring physician: Dessa Phi, MD No address on file  HISTORICAL INFORMATION:   Selected notes from the MEDICAL RECORD NUMBER    Lab Results  Component Value Date   HGBA1C 6.5 (H) 01/27/2020     CURRENT MEDICATIONS: Current Outpatient Medications (Ophthalmic Drugs)  Medication Sig   . ofloxacin (OCUFLOX) 0.3 % ophthalmic solution Place 1 drop into the left eye 4 (four) times daily for 21 days.  Marland Kitchen ketorolac (ACULAR) 0.5 % ophthalmic solution Place 1 drop into the left eye 4 (four) times daily.  . prednisoLONE acetate (PRED FORTE) 1 % ophthalmic suspension Place 1 drop into the left eye 4 (four) times daily for 21 days.  . prednisoLONE acetate (PRED FORTE) 1 % ophthalmic suspension Place 1 drop into the left eye 4 (four) times daily for 21 days.  Marland Kitchen PROLENSA 0.07 % SOLN  (Patient not taking: Reported on 02/19/2020)   No current facility-administered medications for this visit. (Ophthalmic Drugs)   Current Outpatient Medications (Other)  Medication Sig  . acetaminophen (TYLENOL) 500 MG tablet Take 1 tablet (500 mg total) by mouth every 6 (six) hours as needed.  . carvedilol (COREG) 12.5 MG tablet TAKE 1 TABLET TWICE DAILY WITH MEALS  . furosemide (LASIX) 20 MG tablet TAKE 1 TABLET EVERY DAY  . glucose blood (ACCU-CHEK GUIDE) test strip Use as instructed twice per day and as directed  . lisinopril (ZESTRIL) 40 MG tablet TAKE 1 TABLET TWICE DAILY  . simvastatin (ZOCOR) 5 MG tablet TAKE 1 TABLET EVERY DAY  . spironolactone (ALDACTONE) 25 MG tablet TAKE 1/2 TABLET EVERY DAY   No current facility-administered medications for this visit. (Other)      REVIEW OF SYSTEMS:  ALLERGIES No Known Allergies  PAST MEDICAL HISTORY Past Medical History:  Diagnosis Date  . Cardiomyopathy ([place])    probable HTN cardiomyopathy;  Lexiscan Myoview (02/07/2013): EF 34%, old small apical infarct, no ischemia, moderate apical hypokinesis.  . Chronic systolic CHF (congestive heart failure) ([place])    a. Echocardiogram (02/05/2013): Mild LVH, EF 25-30%, diffuse HK, mild MR, mild LAE, mild RVE, moderately reduced RVSF, mild RAE, PASP 64, trivial effusion.;  b.  Echo (04/2013): EF 30-35%, diffuse HK, Gr 2 DD, mod LAE, mildly reduced RVSF, mild RAE, PASP 46  . Ejection fraction < 50%   .  Hypertension   . Posterior vitreous detachment, left eye 01/26/2020  . Pulmonary HTN ([place])   . Snoring    needs sleep study  . Thigh shingles 01/04/2015   R L2 dermatome   . Thrombocytopenia ([place])    Past Surgical History:  Procedure Laterality Date  . Cataract surgery      FAMILY HISTORY Family History  Problem Relation Age of Onset  . Heart attack Mother   . Stroke Father     SOCIAL HISTORY Social History   Tobacco Use  . Smoking status: Former Smoker    Types: Cigarettes    Quit date: 02/06/1995    Years since quitting: 25.0  . Smokeless tobacco: Never Used  Vaping Use  . Vaping Use: Unknown  Substance Use Topics  . Alcohol use: No  . Drug use: No         OPHTHALMIC EXAM:  Base Eye Exam    Visual Acuity (ETDRS)      Right Left   Dist [place]  20/100 -2   Dist ph [place]  NI       Tonometry (Tonopen, 8:12 AM)      Right Left   Pressure 17 20       Pupils      Dark Light   Right 4 3   Left dilated        Neuro/Psych    Oriented x3: Yes   Mood/Affect: Normal        Slit Lamp and Fundus Exam    External Exam      Right Left   External Normal Normal       Slit Lamp Exam      Right Left   Lids/Lashes Normal Normal   Conjunctiva/Sclera White and quiet  good coverage    Cornea Clear Clear   Anterior Chamber Deep and quiet Deep and quiet   Iris Round and reactive Inferior PI stable, no visible chafe   Lens Clear Centered posterior chamber intraocular lens   Anterior Vitreous Normal Normal, clear vitrectomized       Fundus Exam      Right Left   Posterior Vitreous  Normal clear vitrectomized   Disc  Normal   C/D Ratio  0.35   Macula  Cystoid macular edema, Macular thickening   Vessels  Normal   Periphery  Normal          IMAGING AND PROCEDURES  Imaging and Procedures for 02/19/20           ASSESSMENT/PLAN:  Vitreous membranes and strands, left Looks great OS today, postop day #1  Cystoid macular edema of left eye We will  continue on topical ketorolac to assist in clearance of the CME      ICD-10-CM   1. Vitreous membranes and strands, left  H43.312   2. Cystoid macular edema of left eye  H35.352  1.  2.  3.  Ophthalmic Meds Ordered this visit:  Meds ordered this encounter  Medications  . ketorolac (ACULAR) 0.5 % ophthalmic solution    Sig: Place 1 drop into the left eye 4 (four) times daily.    Dispense:  5 mL    Refill:  4  . prednisoLONE acetate (PRED FORTE) 1 % ophthalmic suspension    Sig: Place 1 drop into the left eye 4 (four) times daily for 21 days.    Dispense:  5 mL    Refill:  0  . prednisoLONE acetate (PRED FORTE) 1 % ophthalmic suspension    Sig: Place 1 drop into the left eye 4 (four) times daily for 21 days.    Dispense:  5 mL    Refill:  0       Return in about 1 week (around 02/26/2020) for dilate, OS, OCT.  Patient Instructions  Patient instructed to resume topical medications left eye only  Ketorolac 1 drop left eye 4 times daily  Prednisolone acetate 1 drop left eye 4 times daily  Ofloxacin 1 drop left eye 4 times daily  [facility]    Explained the diagnoses, plan, and follow up with the patient and they expressed understanding.  Patient expressed understanding of the importance of proper follow up care.   Alford Highland Chardonnay Holzmann M.D. Diseases & Surgery of the Retina and Vitreous [facility] 02/19/20     Abbreviations: M myopia (nearsighted); A astigmatism; H hyperopia (farsighted); P presbyopia; Mrx spectacle prescription;  CTL contact lenses; OD right eye; OS left eye; OU both eyes  XT exotropia; ET esotropia; PEK punctate epithelial keratitis; PEE punctate epithelial erosions; DES dry eye syndrome; MGD meibomian gland dysfunction; ATs artificial tears; PFAT's preservative free artificial tears; NSC nuclear sclerotic cataract; PSC posterior subcapsular cataract; ERM epi-retinal membrane; PVD posterior vitreous detachment;  RD retinal detachment; DM diabetes mellitus; DR diabetic retinopathy; NPDR non-proliferative diabetic retinopathy; PDR proliferative diabetic retinopathy; CSME clinically significant macular edema; DME diabetic macular edema; dbh dot blot hemorrhages; CWS cotton wool spot; POAG primary open angle glaucoma; C/D cup-to-disc ratio; HVF humphrey visual field; GVF goldmann visual field; OCT optical coherence tomography; IOP intraocular pressure; BRVO Branch retinal vein occlusion; CRVO central retinal vein occlusion; CRAO central retinal artery occlusion; BRAO branch retinal artery occlusion; RT retinal tear; SB scleral buckle; PPV pars plana vitrectomy; VH Vitreous hemorrhage; PRP panretinal laser photocoagulation; IVK intravitreal kenalog; VMT vitreomacular traction; MH Macular hole;  NVD neovascularization of the disc; NVE neovascularization elsewhere; AREDS age related eye disease study; ARMD age related macular degeneration; POAG primary open angle glaucoma; EBMD epithelial/anterior basement membrane dystrophy; ACIOL anterior chamber intraocular lens; IOL intraocular lens; PCIOL posterior chamber intraocular lens; Phaco/IOL phacoemulsification with intraocular lens placement; PRK photorefractive keratectomy; LASIK laser assisted in situ keratomileusis; HTN hypertension; DM diabetes mellitus; COPD chronic obstructive pulmonary disease

## 2020-02-19 NOTE — Addendum Note (Signed)
Addended by: Fawn Kirk A on: 02/19/2020 09:26 AM   Modules accepted: Orders

## 2020-02-19 NOTE — Assessment & Plan Note (Signed)
We will continue on topical ketorolac to assist in clearance of the CME

## 2020-02-26 ENCOUNTER — Encounter (INDEPENDENT_AMBULATORY_CARE_PROVIDER_SITE_OTHER): Payer: Self-pay | Admitting: Ophthalmology

## 2020-02-26 ENCOUNTER — Ambulatory Visit (INDEPENDENT_AMBULATORY_CARE_PROVIDER_SITE_OTHER): Payer: Medicare HMO | Admitting: Ophthalmology

## 2020-02-26 ENCOUNTER — Other Ambulatory Visit: Payer: Self-pay

## 2020-02-26 DIAGNOSIS — H35352 Cystoid macular degeneration, left eye: Secondary | ICD-10-CM

## 2020-02-26 DIAGNOSIS — H43312 Vitreous membranes and strands, left eye: Secondary | ICD-10-CM

## 2020-02-26 DIAGNOSIS — H43822 Vitreomacular adhesion, left eye: Secondary | ICD-10-CM

## 2020-02-26 NOTE — Patient Instructions (Signed)
Patient instructed to complete this current topical medications to the left eye  Ketorolac 1 drop left eye 4 times daily, no refill needed  Prednisolone acetate 1 drop left eye 4 times daily no refill needed  Ofloxacin 1 drop left eye 4 times daily no refill needed  If any medications are still remaining at the end of 3 weeks from now, patient is to stop use totally  Patient lifted from all physical activity restrictions in 7 days.  Patient instructed not to mash or compress the eye

## 2020-02-26 NOTE — Progress Notes (Signed)
02/26/2020     CHIEF COMPLAINT Patient presents for Post-op Follow-up (1 Week s\p Vitrectomy OS/Pt states OS vision is better some days than others. Denies pain/discomfort. Using gtts as directed.)   HISTORY OF PRESENT ILLNESS: Jesse Torres is a 70 y.o. male who presents to the clinic today for:   HPI    Post-op Follow-up    In left eye.  Discomfort includes none.  Vision is stable.  I, the attending physician,  performed the HPI with the patient and updated documentation appropriately. Additional comments: 1 Week s\p Vitrectomy OS Pt states OS vision is better some days than others. Denies pain/discomfort. Using gtts as directed.       Last edited by Elyse Jarvis on 02/26/2020  8:32 AM. (History)      Referring physician: Dessa Phi, MD No address on file  HISTORICAL INFORMATION:   Selected notes from the MEDICAL RECORD NUMBER    Lab Results  Component Value Date   HGBA1C 6.5 (H) 01/27/2020     CURRENT MEDICATIONS: Current Outpatient Medications (Ophthalmic Drugs)  Medication Sig  . ketorolac (ACULAR) 0.5 % ophthalmic solution Place 1 drop into the left eye 4 (four) times daily.  Marland Kitchen ofloxacin (OCUFLOX) 0.3 % ophthalmic solution Place 1 drop into the left eye 4 (four) times daily for 21 days.  . prednisoLONE acetate (PRED FORTE) 1 % ophthalmic suspension Place 1 drop into the left eye 4 (four) times daily for 21 days.  Marland Kitchen PROLENSA 0.07 % SOLN  (Patient not taking: Reported on 02/19/2020)   No current facility-administered medications for this visit. (Ophthalmic Drugs)   Current Outpatient Medications (Other)  Medication Sig  . acetaminophen (TYLENOL) 500 MG tablet Take 1 tablet (500 mg total) by mouth every 6 (six) hours as needed.  . carvedilol (COREG) 12.5 MG tablet TAKE 1 TABLET TWICE DAILY WITH MEALS  . furosemide (LASIX) 20 MG tablet TAKE 1 TABLET EVERY DAY  . glucose blood (ACCU-CHEK GUIDE) test strip Use as instructed twice per day and as directed   . lisinopril (ZESTRIL) 40 MG tablet TAKE 1 TABLET TWICE DAILY  . simvastatin (ZOCOR) 5 MG tablet TAKE 1 TABLET EVERY DAY  . spironolactone (ALDACTONE) 25 MG tablet TAKE 1/2 TABLET EVERY DAY   No current facility-administered medications for this visit. (Other)      REVIEW OF SYSTEMS:    ALLERGIES No Known Allergies  PAST MEDICAL HISTORY Past Medical History:  Diagnosis Date  . 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  . Chronic systolic CHF (congestive heart failure) (HCC)    a. Echocardiogram (02/05/2013): Mild LVH, EF 25-30%, diffuse HK, mild MR, mild LAE, mild RVE, moderately reduced RVSF, mild RAE, PASP 64, trivial effusion.;  b.  Echo (04/2013): EF 30-35%, diffuse HK, Gr 2 DD, mod LAE, mildly reduced RVSF, mild RAE, PASP 46  . Ejection fraction < 50%   . Hypertension   . Posterior vitreous detachment, left eye 01/26/2020  . Pulmonary HTN (HCC)   . Snoring    needs sleep study  . Thigh shingles 01/04/2015   R L2 dermatome   . Thrombocytopenia (HCC)    Past Surgical History:  Procedure Laterality Date  . Cataract surgery      FAMILY HISTORY Family History  Problem Relation Age of Onset  . Heart attack Mother   . Stroke Father     SOCIAL HISTORY Social  History   Tobacco Use  . Smoking status: Former Smoker    Types: Cigarettes    Quit date: 02/06/1995    Years since quitting: 25.0  . Smokeless tobacco: Never Used  Vaping Use  . Vaping Use: Unknown  Substance Use Topics  . Alcohol use: No  . Drug use: No         OPHTHALMIC EXAM:  Base Eye Exam    Visual Acuity (Snellen - Linear)      Right Left   Dist [place] 20/20 20/80   Dist ph [place]  20/80 +       Tonometry (Tonopen, 8:36 AM)      Right Left   Pressure 13 10       Pupils      Pupils Dark Light Shape React APD   Right PERRL 4 3 Round Slow None   Left PERRL 4 3 Round Slow  None       Neuro/Psych    Oriented x3: Yes   Mood/Affect: Normal       Dilation    Left eye: 1.0% Mydriacyl, 2.5% Phenylephrine @ 8:36 AM        Slit Lamp and Fundus Exam    External Exam      Right Left   External Normal Normal       Slit Lamp Exam      Right Left   Lids/Lashes Normal Normal   Conjunctiva/Sclera White and quiet  good coverage , no exposed haptics   Cornea Clear Clear   Anterior Chamber Deep and quiet Deep and quiet   Iris Round and reactive Inferior PI stable, no visible chafe   Lens Clear Centered posterior chamber intraocular lens   Anterior Vitreous Normal Normal, clear vitrectomized       Fundus Exam      Right Left   Posterior Vitreous  Normal clear vitrectomized   Disc  Normal   C/D Ratio  0.35   Macula  Normal   Vessels  Normal   Periphery  Normal          IMAGING AND PROCEDURES  Imaging and Procedures for 02/26/20  OCT, Retina - OU - Both Eyes       Right Eye Quality was good. Scan locations included subfoveal. Central Foveal Thickness: 265. Progression has been stable. Findings include vitreomacular adhesion .   Left Eye Quality was good. Scan locations included subfoveal. Central Foveal Thickness: 335. Progression has improved. Findings include abnormal foveal contour, subretinal fluid.   Notes OD remains normal  OS now 1 week status post vitrectomy, repositioning of scleral tunnel fixation of the of intraocular lens.  Vastly improved CME, in fact nearly resolved intraretinal cystoid change.  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 Vitreous membranes and strands, left  H43.312   2. Cystoid macular edema of left eye  H35.352  OCT, Retina - OU - Both Eyes  3. Vitreomacular adhesion of left eye  H43.822     1.  2.  3.  Ophthalmic Meds Ordered this visit:  No orders of the defined types were placed in this encounter.  Return in about 5 weeks (around 04/01/2020) for dilate, OS, OCT, POST OP.  Patient Instructions  Patient instructed to complete this current topical medications to the left eye  Ketorolac 1 drop left eye 4 times daily, no refill needed  Prednisolone acetate 1 drop left eye 4 times daily no refill needed  Ofloxacin 1 drop left eye 4 times daily no refill needed  If any medications are still remaining at the end of 3 weeks from now, patient is to stop use totally  Patient lifted from all physical activity restrictions in 7 days.  Patient instructed not to mash or compress the eye    Explained the diagnoses, plan, and follow up with the patient and they expressed understanding.  Patient expressed understanding of the importance of proper follow up care.   Alford Highland Rhett Mutschler M.D. Diseases & Surgery of the Retina and Vitreous [facility] 02/26/20     Abbreviations: M myopia (nearsighted); A astigmatism; H hyperopia (farsighted); P presbyopia; Mrx spectacle prescription;  CTL contact lenses; OD right eye; OS left eye; OU both eyes  XT exotropia; ET esotropia; PEK punctate epithelial keratitis; PEE punctate epithelial erosions; DES dry eye syndrome; MGD meibomian gland dysfunction; ATs artificial tears; PFAT's preservative free artificial tears; NSC nuclear sclerotic cataract; PSC posterior subcapsular cataract; ERM epi-retinal membrane; PVD posterior vitreous detachment; RD retinal detachment; DM diabetes mellitus; DR diabetic retinopathy; NPDR non-proliferative diabetic retinopathy; PDR proliferative diabetic retinopathy; CSME clinically significant macular edema; DME diabetic macular edema; dbh dot blot hemorrhages; CWS cotton wool spot; POAG primary open angle  glaucoma; C/D cup-to-disc ratio; HVF humphrey visual field; GVF goldmann visual field; OCT optical coherence tomography; IOP intraocular pressure; BRVO Branch retinal vein occlusion; CRVO central retinal vein occlusion; CRAO central retinal artery occlusion; BRAO branch retinal artery occlusion; RT retinal tear; SB scleral buckle; PPV pars plana vitrectomy; VH Vitreous hemorrhage; PRP panretinal laser photocoagulation; IVK intravitreal kenalog; VMT vitreomacular traction; MH Macular hole;  NVD neovascularization of the disc; NVE neovascularization elsewhere; AREDS age related eye disease study; ARMD age related macular degeneration; POAG primary open angle glaucoma; EBMD epithelial/anterior basement membrane dystrophy; ACIOL anterior chamber intraocular lens; IOL intraocular lens; PCIOL posterior chamber intraocular lens; Phaco/IOL phacoemulsification with intraocular lens placement; PRK photorefractive keratectomy; LASIK laser assisted in situ keratomileusis; HTN hypertension; DM diabetes mellitus; COPD chronic obstructive pulmonary disease

## 2020-02-26 NOTE — Assessment & Plan Note (Signed)
Vitrectomy 02-18-2020

## 2020-02-26 NOTE — Assessment & Plan Note (Signed)
Vitrectomy, reposition intraocular lens for chronic CME and vitreomacular adhesion 02-18-20

## 2020-04-01 ENCOUNTER — Other Ambulatory Visit: Payer: Self-pay

## 2020-04-01 ENCOUNTER — Ambulatory Visit (INDEPENDENT_AMBULATORY_CARE_PROVIDER_SITE_OTHER): Payer: Medicare HMO | Admitting: Ophthalmology

## 2020-04-01 ENCOUNTER — Encounter (INDEPENDENT_AMBULATORY_CARE_PROVIDER_SITE_OTHER): Payer: Self-pay | Admitting: Ophthalmology

## 2020-04-01 DIAGNOSIS — H35352 Cystoid macular degeneration, left eye: Secondary | ICD-10-CM | POA: Diagnosis not present

## 2020-04-01 DIAGNOSIS — H43312 Vitreous membranes and strands, left eye: Secondary | ICD-10-CM | POA: Diagnosis not present

## 2020-04-01 DIAGNOSIS — H43822 Vitreomacular adhesion, left eye: Secondary | ICD-10-CM

## 2020-04-01 MED ORDER — KETOROLAC TROMETHAMINE 0.5 % OP SOLN: 1.0000 [drp] | Freq: Four times a day (QID) | OPHTHALMIC | 8 refills | Status: DC

## 2020-04-01 NOTE — Assessment & Plan Note (Signed)
Resolved post vitrectomy 

## 2020-04-01 NOTE — Assessment & Plan Note (Signed)
CME has worsened since last visit date1-13-22 corresponding with cessation of use of topical NSAID.  Will need to resume topical NSAID on a chronic ongoing basis

## 2020-04-01 NOTE — Assessment & Plan Note (Signed)
Resolved post vitrectomy 02-18-20

## 2020-04-01 NOTE — Progress Notes (Signed)
04/01/2020     CHIEF COMPLAINT Patient presents for Post-op Follow-up (5 WK PO OS, vitrectomy, release of vitreous strands and vitreomacular adhesion left eye for CME as well as partial rip positing, repositioning of Zeiss CT [place] by shortening external portion of*external haptic, via internalization SX 02/18/20///Pt reports vision has improved OS. Pt denies any F/F, pain, or pressure OS. ////Last BS: hasn't checked recently. )   HISTORY OF PRESENT ILLNESS: Jesse Torres is a 70 y.o. male who presents to the clinic today for:   HPI    Post-op Follow-up    In left eye.  Vision is improved. Additional comments: 5 WK PO OS, vitrectomy, release of vitreous strands and vitreomacular adhesion left eye for CME as well as partial rip positing, repositioning of Zeiss CT [place] by shortening external portion of*external haptic, via internalization SX 02/18/20   Pt reports vision has improved OS. Pt denies any F/F, pain, or pressure OS.     Last BS: hasn't checked recently.        Last edited by Edmon Crape, MD on 04/01/2020  9:31 AM. (History)      Referring physician: Dessa Phi, MD No address on file  HISTORICAL INFORMATION:   Selected notes from the MEDICAL RECORD NUMBER    Lab Results  Component Value Date   HGBA1C 6.5 (H) 01/27/2020     CURRENT MEDICATIONS: Current Outpatient Medications (Ophthalmic Drugs)  Medication Sig  . ketorolac (ACULAR) 0.5 % ophthalmic solution Place 1 drop into the left eye 4 (four) times daily.  Marland Kitchen PROLENSA 0.07 % SOLN  (Patient not taking: No sig reported)   No current facility-administered medications for this visit. (Ophthalmic Drugs)   Current Outpatient Medications (Other)  Medication Sig  . acetaminophen (TYLENOL) 500 MG tablet Take 1 tablet (500 mg total) by mouth every 6 (six) hours as needed.  . carvedilol (COREG) 12.5 MG tablet TAKE 1 TABLET TWICE DAILY WITH MEALS  . furosemide (LASIX) 20 MG tablet TAKE 1 TABLET EVERY DAY   . glucose blood (ACCU-CHEK GUIDE) test strip Use as instructed twice per day and as directed  . lisinopril (ZESTRIL) 40 MG tablet TAKE 1 TABLET TWICE DAILY  . simvastatin (ZOCOR) 5 MG tablet TAKE 1 TABLET EVERY DAY  . spironolactone (ALDACTONE) 25 MG tablet TAKE 1/2 TABLET EVERY DAY   No current facility-administered medications for this visit. (Other)      REVIEW OF SYSTEMS:    ALLERGIES No Known Allergies  PAST MEDICAL HISTORY Past Medical History:  Diagnosis Date  . 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  . Chronic systolic CHF (congestive heart failure) (HCC)    a. Echocardiogram (02/05/2013): Mild LVH, EF 25-30%, diffuse HK, mild MR, mild LAE, mild RVE, moderately reduced RVSF, mild RAE, PASP 64, trivial effusion.;  b.  Echo (04/2013): EF 30-35%, diffuse HK, Gr 2 DD, mod LAE, mildly reduced RVSF, mild RAE, PASP 46  . Ejection fraction < 50%   . Hypertension   . Posterior vitreous detachment, left eye 01/26/2020  . Pulmonary HTN (HCC)   . Snoring    needs sleep study  . Thigh shingles 01/04/2015   R L2 dermatome   . Thrombocytopenia (HCC)    Past Surgical History:  Procedure Laterality Date  . Cataract surgery      FAMILY HISTORY Family History  Problem Relation Age of Onset  . Heart attack  Mother   . Stroke Father     SOCIAL HISTORY Social History   Tobacco Use  . Smoking status: Former Smoker    Types: Cigarettes    Quit date: 02/06/1995    Years since quitting: 25.1  . Smokeless tobacco: Never Used  Vaping Use  . Vaping Use: Unknown  Substance Use Topics  . Alcohol use: No  . Drug use: No         OPHTHALMIC EXAM:  Base Eye Exam    Visual Acuity (ETDRS)      Right Left   Dist [place] 20/20 20/50 -3   Dist ph [place]  20/40 -2       Tonometry (Tonopen, 9:18 AM)      Right Left   Pressure 13 18       Pupils      Pupils  Dark Light Shape React APD   Right PERRL 4 3 Round Slow None   Left PERRL 4 3 Round Slow None       Visual Fields (Counting fingers)      Left Right    Full Full       Extraocular Movement      Right Left    Full Full       Neuro/Psych    Oriented x3: Yes   Mood/Affect: Normal       Dilation    Left eye: 1.0% Mydriacyl, 2.5% Phenylephrine @ 9:18 AM        Slit Lamp and Fundus Exam    External Exam      Right Left   External Normal Normal       Slit Lamp Exam      Right Left   Lids/Lashes Normal Normal   Conjunctiva/Sclera White and quiet  good coverage , no exposed haptics   Cornea Clear Clear   Anterior Chamber Deep and quiet Deep and quiet   Iris Round and reactive Inferior PI stable, no visible chafe   Lens Clear Centered posterior chamber intraocular lens   Anterior Vitreous Normal Normal, clear vitrectomized, no pigment       Fundus Exam      Right Left   Posterior Vitreous  Normal clear vitrectomized, no pigment   Disc  Normal   C/D Ratio  0.35   Macula  Normal   Vessels  Normal   Periphery  Normal          IMAGING AND PROCEDURES  Imaging and Procedures for 04/01/20  OCT, Retina - OU - Both Eyes       Right Eye Quality was good. Scan locations included subfoveal. Central Foveal Thickness: 263. Progression has been stable.   Left Eye Quality was good. Scan locations included subfoveal. Central Foveal Thickness: 457. Progression has worsened. Findings include cystoid macular edema.   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  Will need to resume topical NSAID on a chronic ongoing  basis      ICD-10-CM   1. Vitreous membranes and strands, left  H43.312 OCT, Retina - OU - Both Eyes  2. Cystoid macular edema of left eye  H35.352   3. Vitreomacular adhesion of left eye  H43.822     1.  We will resume topical NSAID therapy left eye initially 4 times daily ketorolac and will taper in the future as CME resolves left eye  2.  3.  Ophthalmic Meds Ordered this visit:  Meds ordered this encounter  Medications  . ketorolac (ACULAR) 0.5 % ophthalmic solution    Sig: Place 1 drop into the left eye 4 (four) times daily.    Dispense:  5 mL    Refill:  8       Return in about 8 weeks (around 05/27/2020) for dilate, OS, OCT.  There are no Patient Instructions on file for this visit.   Explained the diagnoses, plan, and follow up with the patient and they expressed understanding.  Patient expressed understanding of the importance of proper follow up care.   Alford Highland Myranda Pavone M.D. Diseases & Surgery of the Retina and Vitreous [facility] 04/01/20     Abbreviations: M myopia (nearsighted); A astigmatism; H hyperopia (farsighted); P presbyopia; Mrx spectacle prescription;  CTL contact lenses; OD right eye; OS left eye; OU both eyes  XT exotropia; ET esotropia; PEK punctate epithelial keratitis; PEE punctate epithelial erosions; DES dry eye syndrome; MGD meibomian gland dysfunction; ATs artificial tears; PFAT's preservative free artificial tears; NSC nuclear sclerotic cataract; PSC posterior subcapsular cataract; ERM epi-retinal membrane; PVD posterior vitreous detachment; RD retinal detachment; DM diabetes mellitus; DR diabetic retinopathy; NPDR non-proliferative diabetic retinopathy; PDR proliferative diabetic retinopathy; CSME clinically significant macular edema; DME diabetic  macular edema; dbh dot blot hemorrhages; CWS cotton wool spot; POAG primary open angle glaucoma; C/D cup-to-disc ratio; HVF humphrey visual field; GVF goldmann visual field; OCT optical coherence tomography; IOP intraocular pressure; BRVO Branch retinal vein occlusion; CRVO central retinal vein occlusion; CRAO central retinal artery occlusion; BRAO branch retinal artery occlusion; RT retinal tear; SB scleral buckle; PPV pars plana vitrectomy; VH Vitreous hemorrhage; PRP panretinal laser photocoagulation; IVK intravitreal kenalog; VMT vitreomacular traction; MH Macular hole;  NVD neovascularization of the disc; NVE neovascularization elsewhere; AREDS age related eye disease study; ARMD age related macular degeneration; POAG primary open angle glaucoma; EBMD epithelial/anterior basement membrane dystrophy; ACIOL anterior chamber intraocular lens; IOL intraocular lens; PCIOL posterior chamber intraocular lens; Phaco/IOL phacoemulsification with intraocular lens placement; PRK photorefractive keratectomy; LASIK laser assisted in situ keratomileusis; HTN hypertension; DM diabetes mellitus; COPD chronic obstructive pulmonary disease

## 2020-04-25 NOTE — Progress Notes (Signed)
Cardiology Office Note:    Date:  04/27/2020   ID:  Jesse Torres, DOB 07-17-1950, MRN [id number]  PCP:  Jesse Phi, MD   [facility]  Cardiologist:  No primary care provider on file.  Advanced Practice Provider:  No care team member to display Electrophysiologist:  None    Referring MD: Jesse Phi, MD    History of Present Illness:    Jesse Torres is a 70 y.o. 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 Initially his ejection fraction was noted at 35%. There was no ischemia by past nuclear scan. Cardiac catheterization was never done. He is been very compliant with his medications and has had normalization of left ventricular function - last echo from March of 2016.   Last saw Jesse Torres on 01/26/21 where he was doing well. Remained active and was trying to lose weight.  The patient states that he is doing well. No chest pain, shortness of breath, LE edema, palpitations, or orthopnea. He is active with no exertional symptoms. 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 Blood pressure is 140s at home.   Past Medical History:  Diagnosis Date  . 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  . Chronic systolic CHF (congestive heart failure) (HCC)    a. Echocardiogram (02/05/2013): Mild LVH, EF 25-30%, diffuse HK, mild MR, mild LAE, mild RVE, moderately reduced RVSF, mild RAE, PASP 64, trivial effusion.;  b.  Echo (04/2013): EF 30-35%, diffuse HK, Gr 2 DD, mod LAE, mildly reduced RVSF, mild RAE, PASP 46  . Ejection fraction < 50%   . Hypertension   . Posterior vitreous detachment, left eye 01/26/2020  . Pulmonary  HTN (HCC)   . Snoring    needs sleep study  . Thigh shingles 01/04/2015   R L2 dermatome   . Thrombocytopenia (HCC)     Past Surgical History:  Procedure Laterality Date  . Cataract surgery      Current Medications: Current Meds  Medication Sig  . acetaminophen (TYLENOL) 500 MG tablet Take 1 tablet (500 mg total) by mouth every 6 (six) hours as needed.  Marland Kitchen amLODipine (NORVASC) 2.5 MG tablet Take 1 tablet (2.5 mg total) by mouth daily.  . carvedilol (COREG) 12.5 MG tablet TAKE 1 TABLET TWICE DAILY WITH MEALS  . furosemide (LASIX) 20 MG tablet TAKE 1 TABLET EVERY DAY  . glucose blood (ACCU-CHEK GUIDE) test strip Use as instructed twice per day and as directed  . ketorolac (ACULAR) 0.5 % ophthalmic solution Place 1 drop into the left eye 4 (four) times daily.  Marland Kitchen lisinopril (ZESTRIL) 40 MG tablet TAKE 1 TABLET TWICE DAILY  . PROLENSA 0.07 % SOLN   . simvastatin (ZOCOR) 5 MG tablet TAKE 1 TABLET EVERY DAY  . spironolactone (ALDACTONE) 25 MG tablet Take 1 tablet (25 mg total) by mouth daily.  . [DISCONTINUED] spironolactone (ALDACTONE) 25 MG tablet TAKE 1/2 TABLET EVERY DAY     Allergies:   Patient has no known allergies.   Social History   Socioeconomic History  . Marital status: Married    Spouse name: Not on file  . Number of children: Not on file  . Years of  education: Not on file  . Highest education level: Not on file  Occupational History  . Not on file  Tobacco Use  . Smoking status: Former Smoker    Types: Cigarettes    Quit date: 02/06/1995    Years since quitting: 25.2  . Smokeless tobacco: Never Used  Vaping Use  . Vaping Use: Unknown  Substance and Sexual Activity  . Alcohol use: No  . Drug use: No  . Sexual activity: Not on file  Other Topics Concern  . Not on file  Social History Narrative  . Not on file   Social Determinants of Health   Financial Resource Strain: Not on file  Food Insecurity: Not on file  Transportation Needs: Not on file   Physical Activity: Not on file  Stress: Not on file  Social Connections: Not on file     Family History: The patient's family history includes Heart attack in his mother; Stroke in his father.  ROS:   Please see the history of present illness.    Review of Systems  Constitutional: Negative for chills and fever.  HENT: Negative for sore throat.   Eyes: Negative for blurred vision and redness.  Respiratory: Negative for shortness of breath.   Cardiovascular: Negative for chest pain, palpitations, orthopnea, claudication, leg swelling and PND.  Gastrointestinal: Negative for blood in stool, melena, nausea and vomiting.  Genitourinary: Negative for hematuria.  Musculoskeletal: Positive for joint pain.  Neurological: Negative for dizziness and loss of consciousness.  Endo/Heme/Allergies: Negative for polydipsia.  Psychiatric/Behavioral: Negative for substance abuse.    EKGs/Labs/Other Studies Reviewed:    The following studies were reviewed today: TTE 06/02/2014: - Left ventricle: The cavity size was normal. Wall thickness was  normal. Systolic function was normal. The estimated ejection  fraction was in the range of 60% to 65%. Wall motion was normal;  there were no regional wall motion abnormalities.  - Left atrium: The atrium was mildly dilated.  - Pulmonary arteries: Systolic pressure was mildly increased. PA  peak pressure: 33 mm Hg (S).    Recent Labs: 01/27/2020: ALT 11; BUN 23; Creatinine, Ser 1.19; Hemoglobin 15.9; Platelets 94; Potassium 4.6; Sodium 134  Recent Lipid Panel    Component Value Date/Time   CHOL 119 01/27/2020 0906   TRIG 101 01/27/2020 0906   HDL 35 (L) 01/27/2020 0906   CHOLHDL 3.4 01/27/2020 0906   CHOLHDL 3.8 01/26/2016 0820   VLDL 24 01/26/2016 0820   LDLCALC 65 01/27/2020 0906     Physical Exam:    VS:  BP (!) 146/74   Pulse (!) 55   Ht 5\' 7"  (1.702 m)   Wt 218 lb (98.9 kg)   SpO2 99%   BMI 34.14 kg/m     Wt Readings from Last  3 Encounters:  04/27/20 218 lb (98.9 kg)  01/27/20 223 lb (101.2 kg)  07/29/19 226 lb 12.8 oz (102.9 kg)     GEN:  Well nourished, well developed in no acute distress HEENT: Normal NECK: No JVD; No carotid bruits LYMPHATICS: No lymphadenopathy CARDIAC: Bradycardic, regular, no murmurs, rubs, gallops RESPIRATORY:  Clear to auscultation without rales, wheezing or rhonchi  ABDOMEN: Soft, non-tender, non-distended MUSCULOSKELETAL:  No edema; No deformity  SKIN: Warm and dry NEUROLOGIC:  Alert and oriented x 3 PSYCHIATRIC:  Normal affect   ASSESSMENT:    1. Chronic systolic CHF (congestive heart failure) (HCC)   2. Essential hypertension   3. Medication management   4. Stage 3a chronic kidney disease (  HCC)   5. Pure hypercholesterolemia   6. Diabetes mellitus due to underlying condition, uncontrolled, with hyperglycemia (HCC)   7. Thrombocytopenia (HCC)    PLAN:    In order of problems listed above:  #NICM/hypertensive CM/chronic systolic HF with recovered EF:  Presumed hypertension induced CM with initial EF 35% now recovered back to 60-65% on TTE in 2016. Doing well on GDMT. NYHA class I/II symptoms. Euvolemic on exam today -Continue coreg 12.5mg  BID -Continue lisinopril 40mg  daily -Increase spiro to 25mg  daily -Continue lasix 20mg  daily -Repeat BMET next week -Monitor weights -Low Na diet  #HTN: Elevated to 140s in the office and at home. -Continue coreg 12.5mg  BID -Continue lisinopril 40mg  daily -Increase spironolactone to 25mg  daily -Start amlodipine 2.5mg  daily  #HLD:  LDL 44. -Continue simva 20mg  daily  #Thrombocytopenia: Last plt count 94 omn 01/26/21. -Declined referral to heme -No ASA/AC  #CKD: Stable om labs on 01/26/21 with Cr 1.2 -Repeat BMET next week after increasing spiro  #DM:  Trying to manage with diet and is wanting to work on his weight.  -Follow-up with PCP as scheduled  #History of bradycardia  Asymptomatic -Continue coreg      Medication Adjustments/Labs and Tests Ordered: Current medicines are reviewed at length with the patient today.  Concerns regarding medicines are outlined above.  Orders Placed This Encounter  Procedures  . Basic metabolic panel   Meds ordered this encounter  Medications  . spironolactone (ALDACTONE) 25 MG tablet    Sig: Take 1 tablet (25 mg total) by mouth daily.    Dispense:  90 tablet    Refill:  1    Dose increase  . amLODipine (NORVASC) 2.5 MG tablet    Sig: Take 1 tablet (2.5 mg total) by mouth daily.    Dispense:  90 tablet    Refill:  1    Patient Instructions  Medication Instructions:   INCREASE YOUR SPIRONOLACTONE TO 25 MG BY MOUTH DAILY  START TAKING AMLODIPINE 2.5 MG BY MOUTH DAILY  *If you need a refill on your cardiac medications before your next appointment, please call your pharmacy*   Lab Work:  IN ONE WEEK HERE AT THE OFFICE--WILL CHECK A --BMET  If you have labs (blood work) drawn today and your tests are completely normal, you will receive your results only by: MyChart Message (if you have MyChart) OR . A paper copy in the mail If you have any lab test that is abnormal or we need to change your treatment, we will call you to review the results.   Follow-Up:  AT THE VERY BEGINNING OF THIS AUGUST, FIRST WEEK, WITH DR. IN THE OFFICE       Signed, , MD  04/27/2020 11:38 AM    [facility]

## 2020-04-27 ENCOUNTER — Encounter: Payer: Self-pay | Admitting: Cardiology

## 2020-04-27 ENCOUNTER — Ambulatory Visit: Payer: Medicare HMO | Admitting: Cardiology

## 2020-04-27 ENCOUNTER — Other Ambulatory Visit: Payer: Self-pay

## 2020-04-27 VITALS — BP 146/74 | HR 55 | Ht 67.0 in | Wt 218.0 lb

## 2020-04-27 DIAGNOSIS — I10 Essential (primary) hypertension: Secondary | ICD-10-CM

## 2020-04-27 DIAGNOSIS — N1831 Chronic kidney disease, stage 3a: Secondary | ICD-10-CM | POA: Diagnosis not present

## 2020-04-27 DIAGNOSIS — I5022 Chronic systolic (congestive) heart failure: Secondary | ICD-10-CM | POA: Diagnosis not present

## 2020-04-27 DIAGNOSIS — E7800 Pure hypercholesterolemia, unspecified: Secondary | ICD-10-CM | POA: Diagnosis not present

## 2020-04-27 DIAGNOSIS — D696 Thrombocytopenia, unspecified: Secondary | ICD-10-CM | POA: Diagnosis not present

## 2020-04-27 DIAGNOSIS — E0865 Diabetes mellitus due to underlying condition with hyperglycemia: Secondary | ICD-10-CM | POA: Diagnosis not present

## 2020-04-27 DIAGNOSIS — Z79899 Other long term (current) drug therapy: Secondary | ICD-10-CM | POA: Diagnosis not present

## 2020-04-27 MED ORDER — SPIRONOLACTONE 25 MG PO TABS: 25.0000 mg | ORAL_TABLET | Freq: Every day | ORAL | 1 refills | Status: DC

## 2020-04-27 MED ORDER — AMLODIPINE BESYLATE 2.5 MG PO TABS: 2.5000 mg | ORAL_TABLET | Freq: Every day | ORAL | 1 refills | Status: DC

## 2020-04-27 NOTE — Patient Instructions (Signed)
Medication Instructions:   INCREASE YOUR SPIRONOLACTONE TO 25 MG BY MOUTH DAILY  START TAKING AMLODIPINE 2.5 MG BY MOUTH DAILY  *If you need a refill on your cardiac medications before your next appointment, please call your pharmacy*   Lab Work:  IN ONE WEEK HERE AT THE OFFICE--WILL CHECK A --BMET  If you have labs (blood work) drawn today and your tests are completely normal, you will receive your results only by: Marland Kitchen MyChart Message (if you have MyChart) OR . A paper copy in the mail If you have any lab test that is abnormal or we need to change your treatment, we will call you to review the results.   Follow-Up:  AT THE VERY BEGINNING OF THIS AUGUST, FIRST WEEK, WITH DR. Shari Prows IN THE OFFICE

## 2020-05-04 ENCOUNTER — Other Ambulatory Visit: Payer: Self-pay

## 2020-05-04 ENCOUNTER — Other Ambulatory Visit: Payer: Medicare HMO | Admitting: *Deleted

## 2020-05-04 DIAGNOSIS — Z79899 Other long term (current) drug therapy: Secondary | ICD-10-CM

## 2020-05-04 DIAGNOSIS — I10 Essential (primary) hypertension: Secondary | ICD-10-CM | POA: Diagnosis not present

## 2020-05-04 DIAGNOSIS — N1831 Chronic kidney disease, stage 3a: Secondary | ICD-10-CM | POA: Diagnosis not present

## 2020-05-04 LAB — BASIC METABOLIC PANEL
BUN/Creatinine Ratio: 19 (ref 10–24)
BUN: 20 mg/dL (ref 8–27)
CO2: 23 mmol/L (ref 20–29)
Calcium: 9.4 mg/dL (ref 8.6–10.2)
Chloride: 100 mmol/L (ref 96–106)
Creatinine, Ser: 1.07 mg/dL (ref 0.76–1.27)
Glucose: 178 mg/dL — ABNORMAL HIGH (ref 65–99)
Potassium: 3.9 mmol/L (ref 3.5–5.2)
Sodium: 137 mmol/L (ref 134–144)
eGFR: 75 mL/min/{1.73_m2} (ref >59)

## 2020-05-27 ENCOUNTER — Encounter (INDEPENDENT_AMBULATORY_CARE_PROVIDER_SITE_OTHER): Payer: Self-pay | Admitting: Ophthalmology

## 2020-05-27 ENCOUNTER — Encounter (INDEPENDENT_AMBULATORY_CARE_PROVIDER_SITE_OTHER): Payer: Medicare HMO | Admitting: Ophthalmology

## 2020-05-27 ENCOUNTER — Ambulatory Visit (INDEPENDENT_AMBULATORY_CARE_PROVIDER_SITE_OTHER): Payer: Medicare HMO | Admitting: Ophthalmology

## 2020-05-27 ENCOUNTER — Other Ambulatory Visit: Payer: Self-pay

## 2020-05-27 DIAGNOSIS — H35352 Cystoid macular degeneration, left eye: Secondary | ICD-10-CM | POA: Diagnosis not present

## 2020-05-27 DIAGNOSIS — H43312 Vitreous membranes and strands, left eye: Secondary | ICD-10-CM

## 2020-05-27 DIAGNOSIS — H43822 Vitreomacular adhesion, left eye: Secondary | ICD-10-CM

## 2020-05-27 MED ORDER — KETOROLAC TROMETHAMINE 0.5 % OP SOLN: 1.0000 [drp] | Freq: Two times a day (BID) | OPHTHALMIC | 6 refills | Status: DC

## 2020-05-27 NOTE — Assessment & Plan Note (Signed)
Resolved post vitrectomy 

## 2020-05-27 NOTE — Assessment & Plan Note (Signed)
Improved on topical ketorolac 4 times daily.  Will need to maintain patient on ketorolac topically twice daily likely long-term perhaps indefinitely

## 2020-05-27 NOTE — Progress Notes (Signed)
05/27/2020     CHIEF COMPLAINT Patient presents for Retina Follow Up (8 week fu OS//Pt s/p Vitrectomy OS. Pt states, "My VA seems to be a little better maybe but not much."/Pt reports that he just completed rx gtts at the beginning of this week./ /LBS: 182)   HISTORY OF PRESENT ILLNESS: Jesse Torres is a 70 y.o. male who presents to the clinic today for:   HPI    Retina Follow Up    Patient presents with  Other.  In left eye.  This started 8 weeks ago.  Duration of 8 weeks. Additional comments: 8 week fu OS  Pt s/p Vitrectomy OS. Pt states, "My VA seems to be a little better maybe but not much." Pt reports that he just completed rx gtts at the beginning of this week.   LBS: 182       Last edited by Demetrios Loll, COA on 05/27/2020  8:30 AM. (History)      Referring physician: Dessa Phi, MD No address on file  HISTORICAL INFORMATION:   Selected notes from the MEDICAL RECORD NUMBER    Lab Results  Component Value Date   HGBA1C 6.5 (H) 01/27/2020     CURRENT MEDICATIONS: Current Outpatient Medications (Ophthalmic Drugs)  Medication Sig  . ketorolac (ACULAR) 0.5 % ophthalmic solution Place 1 drop into the left eye 2 (two) times daily.   No current facility-administered medications for this visit. (Ophthalmic Drugs)   Current Outpatient Medications (Other)  Medication Sig  . acetaminophen (TYLENOL) 500 MG tablet Take 1 tablet (500 mg total) by mouth every 6 (six) hours as needed.  Marland Kitchen amLODipine (NORVASC) 2.5 MG tablet Take 1 tablet (2.5 mg total) by mouth daily.  . carvedilol (COREG) 12.5 MG tablet TAKE 1 TABLET TWICE DAILY WITH MEALS  . furosemide (LASIX) 20 MG tablet TAKE 1 TABLET EVERY DAY  . glucose blood (ACCU-CHEK GUIDE) test strip Use as instructed twice per day and as directed  . lisinopril (ZESTRIL) 40 MG tablet TAKE 1 TABLET TWICE DAILY  . simvastatin (ZOCOR) 5 MG tablet TAKE 1 TABLET EVERY DAY  . spironolactone (ALDACTONE) 25 MG tablet Take 1  tablet (25 mg total) by mouth daily.   No current facility-administered medications for this visit. (Other)      REVIEW OF SYSTEMS:    ALLERGIES No Known Allergies  PAST MEDICAL HISTORY Past Medical History:  Diagnosis Date  . 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  . Chronic systolic CHF (congestive heart failure) (HCC)    a. Echocardiogram (02/05/2013): Mild LVH, EF 25-30%, diffuse HK, mild MR, mild LAE, mild RVE, moderately reduced RVSF, mild RAE, PASP 64, trivial effusion.;  b.  Echo (04/2013): EF 30-35%, diffuse HK, Gr 2 DD, mod LAE, mildly reduced RVSF, mild RAE, PASP 46  . Ejection fraction < 50%   . Hypertension   . Posterior vitreous detachment, left eye 01/26/2020  . Pulmonary HTN (HCC)   . Snoring    needs sleep study  . Thigh shingles 01/04/2015   R L2 dermatome   . Thrombocytopenia (HCC)    Past Surgical History:  Procedure Laterality Date  . Cataract surgery      FAMILY HISTORY Family History  Problem Relation Age of Onset  . Heart attack Mother   . Stroke Father     SOCIAL HISTORY Social History   Tobacco Use  .  Smoking status: Former Smoker    Types: Cigarettes    Quit date: 02/06/1995    Years since quitting: 25.3  . Smokeless tobacco: Never Used  Vaping Use  . Vaping Use: Unknown  Substance Use Topics  . Alcohol use: No  . Drug use: No         OPHTHALMIC EXAM: Base Eye Exam    Visual Acuity (ETDRS)      Right Left   Dist [place] 20/20 -1 20/60 +1   Dist ph [place]  20/40 -2       Tonometry (Tonopen, 8:34 AM)      Right Left   Pressure 16 13       Pupils      Pupils Dark Light Shape React APD   Right PERRL 4 3 Round Slow None   Left PERRL 4 3 Round Slow None       Visual Fields (Counting fingers)      Left Right    Full Full       Extraocular Movement      Right Left    Full Full       Neuro/Psych     Oriented x3: Yes   Mood/Affect: Normal       Dilation    Left eye: 1.0% Mydriacyl, 2.5% Phenylephrine @ 8:34 AM        Slit Lamp and Fundus Exam    External Exam      Right Left   External Normal Normal       Slit Lamp Exam      Right Left   Lids/Lashes Normal Normal   Conjunctiva/Sclera White and quiet  good coverage , no exposed haptics   Cornea Clear Clear   Anterior Chamber Deep and quiet Deep and quiet   Iris Round and reactive Inferior PI stable, no visible chafe   Lens Clear Centered posterior chamber intraocular lens   Anterior Vitreous Normal Normal, clear vitrectomized, no pigment       Fundus Exam      Right Left   Posterior Vitreous  Normal clear vitrectomized, no pigment   Disc  Normal   C/D Ratio  0.35   Macula  Normal, , no macular thickening   Vessels  Normal   Periphery  Normal          IMAGING AND PROCEDURES  Imaging and Procedures for 05/27/20  OCT, Retina - OU - Both Eyes       Right Eye Quality was good. Scan locations included subfoveal. Central Foveal Thickness: 263. Progression has been stable.   Left Eye Quality was good. Scan locations included subfoveal. Central Foveal Thickness: 336. Progression has improved. Findings include cystoid macular edema.   Notes CME left eye has recurred returned status post cessation of use of topical NSAID left eye, and today compared to last visit has improved dramatically on topical PROLENSA    we may use generic ketorolac if patient chooses which would be twice daily long-term                ASSESSMENT/PLAN:  Cystoid macular edema of left eye Improved on topical ketorolac 4 times daily.  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 Cystoid macular edema of left eye  H35.352 OCT,  Retina - OU - Both  Eyes  2. Vitreous membranes and strands, left  H43.312   3. Vitreomacular adhesion of left eye  H43.822     1.  Patient instructed to maintain use of topical ketorolac twice daily indefinitely at least for the long-term to maintain resolution of CME that has been acquired again.  2.  Patient instructed to contact the office promptly for new onset visual acuity changes or declines otherwise follow-up in 4 months  3.  Ophthalmic Meds Ordered this visit:  Meds ordered this encounter  Medications  . ketorolac (ACULAR) 0.5 % ophthalmic solution    Sig: Place 1 drop into the left eye 2 (two) times daily.    Dispense:  5 mL    Refill:  6       Return in about 4 months (around 09/26/2020) for dilate, OS, OCT.  There are no Patient Instructions on file for this visit.   Explained the diagnoses, plan, and follow up with the patient and they expressed understanding.  Patient expressed understanding of the importance of proper follow up care.   Alford Highland Takiyah Bohnsack M.D. Diseases & Surgery of the Retina and Vitreous [facility] 05/27/20     Abbreviations: M myopia (nearsighted); A astigmatism; H hyperopia (farsighted); P presbyopia; Mrx spectacle prescription;  CTL contact lenses; OD right eye; OS left eye; OU both eyes  XT exotropia; ET esotropia; PEK punctate epithelial keratitis; PEE punctate epithelial erosions; DES dry eye syndrome; MGD meibomian gland dysfunction; ATs artificial tears; PFAT's preservative free artificial tears; NSC nuclear sclerotic cataract; PSC posterior subcapsular cataract; ERM epi-retinal membrane; PVD posterior vitreous detachment; RD retinal detachment; DM diabetes mellitus; DR diabetic retinopathy; NPDR non-proliferative diabetic retinopathy; PDR proliferative diabetic retinopathy; CSME clinically significant macular edema; DME diabetic macular edema; dbh dot blot hemorrhages; CWS cotton wool spot; POAG primary open angle glaucoma;  C/D cup-to-disc ratio; HVF humphrey visual field; GVF goldmann visual field; OCT optical coherence tomography; IOP intraocular pressure; BRVO Branch retinal vein occlusion; CRVO central retinal vein occlusion; CRAO central retinal artery occlusion; BRAO branch retinal artery occlusion; RT retinal tear; SB scleral buckle; PPV pars plana vitrectomy; VH Vitreous hemorrhage; PRP panretinal laser photocoagulation; IVK intravitreal kenalog; VMT vitreomacular traction; MH Macular hole;  NVD neovascularization of the disc; NVE neovascularization elsewhere; AREDS age related eye disease study; ARMD age related macular degeneration; POAG primary open angle glaucoma; EBMD epithelial/anterior basement membrane dystrophy; ACIOL anterior chamber intraocular lens; IOL intraocular lens; PCIOL posterior chamber intraocular lens; Phaco/IOL phacoemulsification with intraocular lens placement; PRK photorefractive keratectomy; LASIK laser assisted in situ keratomileusis; HTN hypertension; DM diabetes mellitus; COPD chronic obstructive pulmonary disease

## 2020-07-26 ENCOUNTER — Other Ambulatory Visit: Payer: Self-pay

## 2020-07-26 DIAGNOSIS — N1831 Chronic kidney disease, stage 3a: Secondary | ICD-10-CM

## 2020-07-26 DIAGNOSIS — Z79899 Other long term (current) drug therapy: Secondary | ICD-10-CM

## 2020-07-26 DIAGNOSIS — I10 Essential (primary) hypertension: Secondary | ICD-10-CM

## 2020-07-26 MED ORDER — AMLODIPINE BESYLATE 2.5 MG PO TABS: 2.5000 mg | ORAL_TABLET | Freq: Every day | ORAL | 2 refills | Status: DC

## 2020-07-26 NOTE — Telephone Encounter (Signed)
Pt's medication was sent to pt's pharmacy as requested. Confirmation received.  °

## 2020-09-07 NOTE — Progress Notes (Deleted)
Cardiology Office Note:    Date:  09/07/2020   ID:  Jesse Torres, DOB 1950-05-02, MRN [id number]  PCP:  Jesse Phi, MD   [facility]  Cardiologist:  None  Advanced Practice Provider:  No care team member to display Electrophysiologist:  None    Referring MD: Jesse Phi, MD    History of Present Illness:    Jesse Torres is a 70 y.o. 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 Initially his ejection fraction was noted at 35%. There was no ischemia by past nuclear scan. Cardiac catheterization was never done. He is been very compliant with his medications and has had normalization of left ventricular function - last echo from March of 2016.    Last seen in clinic on 04/27/20 where he was doing well. No anginal symptoms. Blood pressure was elevated and therefore he was started on amlodipine 2.5mg  daily and his spironolactone was increased to 25mg  daily.  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   Chronic systolic CHF (congestive heart failure) (HCC)    a. Echocardiogram (02/05/2013): Mild LVH, EF 25-30%, diffuse HK, mild MR, mild LAE, mild RVE, moderately reduced RVSF, mild RAE, PASP 64, trivial effusion.;  b.  Echo (04/2013): EF 30-35%, diffuse HK, Gr 2 DD, mod LAE, mildly reduced RVSF, mild RAE, PASP 46   Ejection fraction < 50%    Hypertension    Posterior vitreous detachment, left eye 01/26/2020   Pulmonary HTN (HCC)    Snoring    needs sleep study   Thigh shingles 01/04/2015   R L2 dermatome    Thrombocytopenia [facility])     Past Surgical History:  Procedure  Laterality Date   Cataract surgery      Current Medications: No outpatient medications have been marked as taking for the 09/14/20 encounter (Appointment) with 11/14/20, MD.     Allergies:   Patient has no known allergies.   Social History   Socioeconomic History   Marital status: Married    Spouse name: Not on file   Number of children: Not on file   Years of education: Not on file   Highest education level: Not on file  Occupational History   Not on file  Tobacco Use   Smoking status: Former    Types: Cigarettes    Quit date: 02/06/1995    Years since quitting: 25.6   Smokeless tobacco: Never  Vaping Use   Vaping Use: Unknown  Substance and Sexual Activity   Alcohol use: No   Drug use: No   Sexual activity: Not on file  Other Topics Concern   Not on file  Social History Narrative   Not on file   Social Determinants of Health   Financial Resource Strain: Not on file  Food Insecurity: Not on file  Transportation Needs: Not on file  Physical Activity: Not on file  Stress: Not on file  Social Connections: Not on file     Family History: The patient's family history includes Heart attack in his mother; Stroke in his father.  ROS:   Please see the history of present illness.    Review of  Systems  Constitutional:  Negative for chills and fever.  HENT:  Negative for sore throat.   Eyes:  Negative for blurred vision and redness.  Respiratory:  Negative for shortness of breath.   Cardiovascular:  Negative for chest pain, palpitations, orthopnea, claudication, leg swelling and PND.  Gastrointestinal:  Negative for blood in stool, melena, nausea and vomiting.  Genitourinary:  Negative for hematuria.  Musculoskeletal:  Positive for joint pain.  Neurological:  Negative for dizziness and loss of consciousness.  Endo/Heme/Allergies:  Negative for polydipsia.  Psychiatric/Behavioral:  Negative for substance abuse.    EKGs/Labs/Other Studies Reviewed:     The following studies were reviewed today: TTE Jun 23, 2014: - Left ventricle: The cavity size was normal. Wall thickness was    normal. Systolic function was normal. The estimated ejection    fraction was in the range of 60% to 65%. Wall motion was normal;    there were no regional wall motion abnormalities.  - Left atrium: The atrium was mildly dilated.  - Pulmonary arteries: Systolic pressure was mildly increased. PA    peak pressure: 33 mm Hg (S).    Recent Labs: 01/27/2020: ALT 11; Hemoglobin 15.9; Platelets 94 05/04/2020: BUN 20; Creatinine, Ser 1.07; Potassium 3.9; Sodium 137  Recent Lipid Panel    Component Value Date/Time   CHOL 119 01/27/2020 0906   TRIG 101 01/27/2020 0906   HDL 35 (L) 01/27/2020 0906   CHOLHDL 3.4 01/27/2020 0906   CHOLHDL 3.8 01/26/2016 0820   VLDL 24 01/26/2016 0820   LDLCALC 65 01/27/2020 0906     Physical Exam:    VS:  There were no vitals taken for this visit.    Wt Readings from Last 3 Encounters:  04/27/20 218 lb (98.9 kg)  01/27/20 223 lb (101.2 kg)  07/29/19 226 lb 12.8 oz (102.9 kg)     GEN:  Well nourished, well developed in no acute distress HEENT: Normal NECK: No JVD; No carotid bruits LYMPHATICS: No lymphadenopathy CARDIAC: Bradycardic, regular, no murmurs, rubs, gallops RESPIRATORY:  Clear to auscultation without rales, wheezing or rhonchi  ABDOMEN: Soft, non-tender, non-distended MUSCULOSKELETAL:  No edema; No deformity  SKIN: Warm and dry NEUROLOGIC:  Alert and oriented x 3 PSYCHIATRIC:  Normal affect   ASSESSMENT:    No diagnosis found.  PLAN:    In order of problems listed above:  #NICM/hypertensive CM/chronic systolic HF with recovered EF:  Presumed hypertension induced CM with initial EF 35% now recovered back to 60-65% on TTE in 06-23-14. Doing well on GDMT. NYHA class I/II symptoms. Euvolemic on exam today -Continue coreg 12.5mg  BID -Continue lisinopril 40mg  daily -Contiunue spiro 25mg  daily -Continue lasix 20mg   daily -Monitor weights -Low Na diet   #HTN: Elevated to 140s in the office and at home. -Continue coreg 12.5mg  BID -Continue lisinopril 40mg  daily -Contiunue spiro 25mg  daily -Continue amlodipine 2.5mg  daily   #HLD:  LDL 44. -Continue simva 20mg  daily  #Thrombocytopenia: Last plt count 94 omn 01/26/21. -Declined referral to heme -No ASA/AC   #CKD: Stable. -Continue to monitor   #DM:  Trying to manage with diet and is wanting to work on his weight.  -Follow-up with PCP as scheduled   #History of bradycardia  Asymptomatic -Continue coreg      Medication Adjustments/Labs and Tests Ordered: Current medicines are reviewed at length with the patient today.  Concerns regarding medicines are outlined above.  No orders of the defined types were placed in this encounter.  No orders of the defined types  were placed in this encounter.   There are no Patient Instructions on file for this visit.    Signed, Meriam Sprague, MD  09/07/2020 9:06 AM    [facility]

## 2020-09-14 ENCOUNTER — Ambulatory Visit: Payer: Medicare HMO | Admitting: Cardiology

## 2020-09-15 ENCOUNTER — Ambulatory Visit: Payer: Medicare HMO | Admitting: Cardiovascular Disease

## 2020-09-15 ENCOUNTER — Other Ambulatory Visit: Payer: Self-pay

## 2020-09-15 ENCOUNTER — Encounter: Payer: Self-pay | Admitting: Cardiovascular Disease

## 2020-09-15 DIAGNOSIS — R799 Abnormal finding of blood chemistry, unspecified: Secondary | ICD-10-CM

## 2020-09-15 DIAGNOSIS — I420 Dilated cardiomyopathy: Secondary | ICD-10-CM

## 2020-09-15 DIAGNOSIS — I10 Essential (primary) hypertension: Secondary | ICD-10-CM | POA: Diagnosis not present

## 2020-09-15 DIAGNOSIS — E7800 Pure hypercholesterolemia, unspecified: Secondary | ICD-10-CM

## 2020-09-15 DIAGNOSIS — Z79899 Other long term (current) drug therapy: Secondary | ICD-10-CM

## 2020-09-15 DIAGNOSIS — I5022 Chronic systolic (congestive) heart failure: Secondary | ICD-10-CM

## 2020-09-15 DIAGNOSIS — R7989 Other specified abnormal findings of blood chemistry: Secondary | ICD-10-CM | POA: Diagnosis not present

## 2020-09-15 DIAGNOSIS — N1831 Chronic kidney disease, stage 3a: Secondary | ICD-10-CM | POA: Diagnosis not present

## 2020-09-15 MED ORDER — FUROSEMIDE 20 MG PO TABS: 20.0000 mg | ORAL_TABLET | Freq: Every day | ORAL | 3 refills | Status: DC

## 2020-09-15 MED ORDER — SIMVASTATIN 5 MG PO TABS: 5.0000 mg | ORAL_TABLET | Freq: Every day | ORAL | 3 refills | Status: DC

## 2020-09-15 MED ORDER — SPIRONOLACTONE 25 MG PO TABS: 25.0000 mg | ORAL_TABLET | Freq: Every day | ORAL | 2 refills | Status: DC

## 2020-09-15 MED ORDER — LISINOPRIL 40 MG PO TABS: 40.0000 mg | ORAL_TABLET | Freq: Two times a day (BID) | ORAL | 3 refills | Status: DC

## 2020-09-15 MED ORDER — CARVEDILOL 12.5 MG PO TABS: 12.5000 mg | ORAL_TABLET | Freq: Two times a day (BID) | ORAL | 3 refills | Status: DC

## 2020-09-15 NOTE — Patient Instructions (Signed)
Medication Instructions:  No changes *If you need a refill on your cardiac medications before your next appointment, please call your pharmacy*   Lab Work: none If you have labs (blood work) drawn today and your tests are completely normal, you will receive your results only by: MyChart Message (if you have MyChart) OR A paper copy in the mail If you have any lab test that is abnormal or we need to change your treatment, we will call you to review the results.   Testing/Procedures: none   Follow-Up: At [facility], you and your health needs are our priority.  As part of our continuing mission to provide you with exceptional heart care, we have created designated Provider Care Teams.  These Care Teams include your primary Cardiologist (physician) and Advanced Practice Providers (APPs -  Physician Assistants and Nurse Practitioners) who all work together to provide you with the care you need, when you need it.  We recommend signing up for the patient portal called "MyChart".  Sign up information is provided on this After Visit Summary.  MyChart is used to connect with patients for Virtual Visits (Telemedicine).  Patients are able to view lab/test results, encounter notes, upcoming appointments, etc.  Non-urgent messages can be sent to your provider as well.   To learn more about what you can do with MyChart, go to [email or web address].    Your next appointment:   6 month(s)  The format for your next appointment:   In Person  Provider:   You may see Laurance Flatten, MD or one of the following Advanced Practice Providers on your designated Care Team:   Tereso Newcomer, PA-C Chelsea Aus, [place]   Other Instructions

## 2020-09-15 NOTE — Progress Notes (Signed)
Chief Complaint  Jesse Torres presents with   Follow-up    NICM    History of Present Illness: 70 yo male with history of NICM, presumed to be from HTN but now recovered LV function, HTN here today as an add on to my schedule. He is followed by Dr. Shari Prows. He tells me today that he is here for his 6 month follow up. He is having no issues. He denies chest pain, dyspnea or dizziness. He has some mild LE edema during the day that resolves at night. He is still taking Lasix daily. He is active.   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   Chronic systolic CHF (congestive heart failure) (HCC)    a. Echocardiogram (02/05/2013): Mild LVH, EF 25-30%, diffuse HK, mild MR, mild LAE, mild RVE, moderately reduced RVSF, mild RAE, PASP 64, trivial effusion.;  b.  Echo (04/2013): EF 30-35%, diffuse HK, Gr 2 DD, mod LAE, mildly reduced RVSF, mild RAE, PASP 46   Ejection fraction < 50%    Hypertension    Posterior vitreous detachment, left eye 01/26/2020   Pulmonary HTN (HCC)    Snoring    needs sleep study   Thigh shingles 01/04/2015   R L2 dermatome    Thrombocytopenia (HCC)     Past Surgical History:  Procedure Laterality Date   Cataract surgery      Current Outpatient Medications  Medication Sig Dispense Refill   acetaminophen (TYLENOL) 500 MG tablet Take 1 tablet (500 mg total) by mouth every 6 (six) hours as needed. 30 tablet 0   amLODipine (NORVASC) 2.5 MG tablet Take 1 tablet (2.5 mg total) by mouth daily. 90 tablet 2   glucose blood (ACCU-CHEK GUIDE) test strip Use as instructed twice per day and as directed 100 each 12   ketorolac (ACULAR) 0.5 % ophthalmic solution Place 1 drop into the left eye 2 (two) times daily. 5 mL 6   carvedilol (COREG) 12.5 MG tablet Take 1 tablet (12.5  mg total) by mouth 2 (two) times daily with a meal. 180 tablet 3   furosemide (LASIX) 20 MG tablet Take 1 tablet (20 mg total) by mouth daily. 90 tablet 3   lisinopril (ZESTRIL) 40 MG tablet Take 1 tablet (40 mg total) by mouth 2 (two) times daily. 180 tablet 3   simvastatin (ZOCOR) 5 MG tablet Take 1 tablet (5 mg total) by mouth daily. 90 tablet 3   spironolactone (ALDACTONE) 25 MG tablet Take 1 tablet (25 mg total) by mouth daily. 90 tablet 2   No current facility-administered medications for this visit.    No Known Allergies  Social History   Socioeconomic History   Marital status: Married    Spouse name: Not on file   Number of children: Not on file   Years of education: Not on file   Highest education level: Not on file  Occupational History   Not on file  Tobacco Use   Smoking status: Former    Types: Cigarettes    Quit date: 02/06/1995    Years since quitting: 25.6   Smokeless tobacco: Never  Vaping Use   Vaping Use: Unknown  Substance and Sexual Activity   Alcohol use: No   Drug use: No   Sexual activity: Not on file  Other Topics Concern  Not on file  Social History Narrative   Not on file   Social Determinants of Health   Financial Resource Strain: Not on file  Food Insecurity: Not on file  Transportation Needs: Not on file  Physical Activity: Not on file  Stress: Not on file  Social Connections: Not on file  Intimate Partner Violence: Not on file    Family History  Problem Relation Age of Onset   Heart attack Mother    Stroke Father     Review of Systems:  As stated in the HPI and otherwise negative.   BP 130/74   Pulse (!) 59   Ht 5\' 7"  (1.702 m)   Wt 222 lb 9.6 oz (101 kg)   SpO2 98%   BMI 34.86 kg/m   Physical Examination: General: Well developed, well nourished, NAD  HEENT: OP clear, mucus membranes moist  SKIN: warm, dry. No rashes. Neuro: No focal deficits  Musculoskeletal: Muscle strength 5/5 all ext  Psychiatric: Mood and  affect normal  Neck: No JVD, no carotid bruits, no thyromegaly, no lymphadenopathy.  Lungs:Clear bilaterally, no wheezes, rhonci, crackles Cardiovascular: Regular rate and rhythm. No murmurs, gallops or rubs. Abdomen:Soft. Bowel sounds present. Non-tender.  Extremities: No lower extremity edema. Pulses are 2 + in the bilateral DP/PT.  EKG:  EKG is not ordered today.   Recent Labs: 01/27/2020: ALT 11; Hemoglobin 15.9; Platelets 94 05/04/2020: BUN 20; Creatinine, Ser 1.07; Potassium 3.9; Sodium 137   Lipid Panel    Component Value Date/Time   CHOL 119 01/27/2020 0906   TRIG 101 01/27/2020 0906   HDL 35 (L) 01/27/2020 0906   CHOLHDL 3.4 01/27/2020 0906   CHOLHDL 3.8 01/26/2016 0820   VLDL 24 01/26/2016 0820   LDLCALC 65 01/27/2020 0906     Wt Readings from Last 3 Encounters:  09/15/20 222 lb 9.6 oz (101 kg)  04/27/20 218 lb (98.9 kg)  01/27/20 223 lb (101.2 kg)     Assessment and Plan:   1. NICM/Chronic systolic CHF: Doing well. No volume overload on exam. Weight is stable. Continue medical therapy with Coreg, Lisinopril, spironolactone. Continue Lasix.   2. HTN: BP controlled. Continue current therapy.   Current medicines are reviewed at length with the Jesse Torres today.  The Jesse Torres does not have concerns regarding medicines.  The following changes have been made:  no change  Labs/ tests ordered today include:  No orders of the defined types were placed in this encounter.    Disposition:   F/U with Dr. 01/29/20 in 6 months.     Signed, Shari Prows, MD 09/15/2020 8:54 AM    [facility] [address] [place], [place], [place]  [place] Phone: [phone]; Fax: [phone]

## 2020-09-27 ENCOUNTER — Other Ambulatory Visit: Payer: Self-pay

## 2020-09-27 ENCOUNTER — Ambulatory Visit (INDEPENDENT_AMBULATORY_CARE_PROVIDER_SITE_OTHER): Payer: Medicare HMO | Admitting: Ophthalmology

## 2020-09-27 ENCOUNTER — Encounter (INDEPENDENT_AMBULATORY_CARE_PROVIDER_SITE_OTHER): Payer: Self-pay | Admitting: Ophthalmology

## 2020-09-27 DIAGNOSIS — H43312 Vitreous membranes and strands, left eye: Secondary | ICD-10-CM

## 2020-09-27 DIAGNOSIS — H35352 Cystoid macular degeneration, left eye: Secondary | ICD-10-CM | POA: Diagnosis not present

## 2020-09-27 DIAGNOSIS — H43822 Vitreomacular adhesion, left eye: Secondary | ICD-10-CM

## 2020-09-27 NOTE — Assessment & Plan Note (Signed)
Condition resolved post vitrectomy 

## 2020-09-27 NOTE — Progress Notes (Signed)
09/27/2020     CHIEF COMPLAINT Patient presents for Retina Follow Up   HISTORY OF PRESENT ILLNESS: Jesse Torres is a 70 y.o. male who presents to the clinic today for:   HPI     Retina Follow Up           Diagnosis: Other   Laterality: left eye   Onset: 4 months ago   Severity: mild   Duration: 4 months   Course: stable         Comments   4 month fu OS OCT Pt states VA OU stable since last visit. Pt denies FOL, floaters, or ocular pain OU.  Pt reports using Ketorolac BID OS Pt states, 'I take my blood sugar but I do not take my medicine." LBS: 211  Vision OS remaining stable, no longer fuzzy or blurred       Last edited by Edmon Crape, MD on 09/27/2020  8:37 AM.      Referring physician: Olivia Canter, MD [address] [place],  [place] [address]  HISTORICAL INFORMATION:   Selected notes from the MEDICAL RECORD NUMBER    Lab Results  Component Value Date   HGBA1C 6.5 (H) 01/27/2020     CURRENT MEDICATIONS: Current Outpatient Medications (Ophthalmic Drugs)  Medication Sig   ketorolac (ACULAR) 0.5 % ophthalmic solution Place 1 drop into the left eye 2 (two) times daily.   No current facility-administered medications for this visit. (Ophthalmic Drugs)   Current Outpatient Medications (Other)  Medication Sig   acetaminophen (TYLENOL) 500 MG tablet Take 1 tablet (500 mg total) by mouth every 6 (six) hours as needed.   amLODipine (NORVASC) 2.5 MG tablet Take 1 tablet (2.5 mg total) by mouth daily.   carvedilol (COREG) 12.5 MG tablet Take 1 tablet (12.5 mg total) by mouth 2 (two) times daily with a meal.   furosemide (LASIX) 20 MG tablet Take 1 tablet (20 mg total) by mouth daily.   glucose blood (ACCU-CHEK GUIDE) test strip Use as instructed twice per day and as directed   lisinopril (ZESTRIL) 40 MG tablet Take 1 tablet (40 mg total) by mouth 2 (two) times daily.   simvastatin (ZOCOR) 5 MG tablet Take 1 tablet (5 mg total) by mouth daily.    spironolactone (ALDACTONE) 25 MG tablet Take 1 tablet (25 mg total) by mouth daily.   No current facility-administered medications for this visit. 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   Chronic systolic CHF (congestive heart failure) (HCC)    a. Echocardiogram (02/05/2013): Mild LVH, EF 25-30%, diffuse HK, mild MR, mild LAE, mild RVE, moderately reduced RVSF, mild RAE, PASP 64, trivial effusion.;  b.  Echo (04/2013): EF 30-35%, diffuse HK, Gr 2 DD, mod LAE, mildly reduced RVSF, mild RAE, PASP 46   Ejection fraction < 50%    Hypertension    Posterior vitreous detachment, left eye 01/26/2020   Pulmonary HTN (HCC)    Snoring    needs sleep study   Thigh shingles 01/04/2015   R L2 dermatome    Thrombocytopenia (HCC)    Past Surgical History:  Procedure Laterality Date   Cataract surgery      FAMILY HISTORY Family History  Problem Relation Age of Onset   Heart attack Mother    Stroke Father     SOCIAL HISTORY Social History   Tobacco Use   Smoking status: Former    Types: Cigarettes    Quit date: 02/06/1995    Years since quitting: 25.6   Smokeless tobacco: Never  Vaping Use   Vaping Use: Unknown  Substance Use Topics   Alcohol use: No   Drug use: No         OPHTHALMIC EXAM:  Base Eye Exam     Visual Acuity (ETDRS)       Right Left   Dist [place] 20/20 -1 20/30 -1   Dist ph [place]  20/25         Tonometry (Tonopen, 8:10 AM)       Right Left   Pressure 17 17         Pupils       Pupils Dark Light Shape React APD   Right PERRL 4 3 Round Slow None   Left PERRL 4 3 Round Slow None         Visual Fields (Counting fingers)       Left Right    Full Full         Extraocular Movement        Right Left    Full Full         Neuro/Psych     Oriented x3: Yes   Mood/Affect: Normal         Dilation     Left eye: 1.0% Mydriacyl, 2.5% Phenylephrine @ 8:10 AM           Slit Lamp and Fundus Exam     External Exam       Right Left   External Normal Normal         Slit Lamp Exam       Right Left   Lids/Lashes Normal Normal   Conjunctiva/Sclera White and quiet  good coverage , no exposed haptics   Cornea Clear Clear   Anterior Chamber Deep and quiet Deep and quiet, no cells no pigment   Iris Round and reactive Inferior PI stable, no visible chafe   Lens Clear Centered posterior chamber intraocular lens   Anterior Vitreous Normal Normal, clear vitrectomized, no pigment         Fundus Exam       Right Left   Posterior Vitreous  Normal clear vitrectomized, no pigment   Disc  Normal   C/D Ratio  0.35   Macula  Normal, , no macular thickening   Vessels  Normal   Periphery  Normal            IMAGING AND PROCEDURES  Imaging and Procedures for 09/27/20  OCT, Retina - OU - Both Eyes       Right Eye Quality was good. Scan locations included subfoveal. Central Foveal Thickness: 261. Progression has been stable.   Left Eye Quality was good. Scan locations included subfoveal. Central Foveal Thickness: 267. Progression has improved. Findings include cystoid macular edema.   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  Patent PI.  No sign of iris chafe.  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 Cystoid macular edema of left eye  H35.352 OCT, Retina - OU - Both Eyes    2. Vitreomacular adhesion of left eye  H43.822     3. Vitreous membranes and strands, left  H43.312       1.  OS with excellent acuity, in fact patient uses a near vision eye left eye after vitrectomy, mention of posterior chamber lens you using Yamane scleral tunnel technique  2.  OS with secondary CME likely from ciliary body irritation because there is no sign of going iris chafe with patent PI present and no pigmentary dispersion.  CME is now resolving controlled on topical NSAIDs use twice daily  3.  OS chronically use topical NSAIDs twice daily to control the minor CME.  Ophthalmic Meds Ordered this visit:  No orders of the defined types were placed in this encounter.      Return in about 6 months (around 03/30/2021) for DILATE OU, OCT.  There are no Patient Instructions on file for this visit.   Explained the diagnoses, plan, and follow up with the patient and they expressed understanding.  Patient expressed understanding of the importance of proper follow up care.   Alford Highland Milicent Acheampong M.D. Diseases & Surgery of the Retina and Vitreous [facility] 09/27/20     Abbreviations: M myopia (nearsighted); A astigmatism; H hyperopia (farsighted); P presbyopia; Mrx spectacle prescription;  CTL contact lenses; OD right eye; OS left eye; OU both eyes  XT exotropia; ET esotropia; PEK punctate epithelial keratitis; PEE punctate epithelial erosions; DES dry eye syndrome; MGD meibomian gland dysfunction; ATs artificial tears; PFAT's preservative free artificial tears; NSC nuclear sclerotic cataract; PSC posterior subcapsular cataract; ERM epi-retinal membrane; PVD posterior vitreous detachment; RD retinal detachment; DM diabetes mellitus; DR diabetic  retinopathy; NPDR non-proliferative diabetic retinopathy; PDR proliferative diabetic retinopathy; CSME clinically significant macular edema; DME diabetic macular edema; dbh dot blot hemorrhages; CWS cotton wool spot; POAG primary open angle glaucoma; C/D cup-to-disc ratio; HVF humphrey visual field; GVF goldmann visual field; OCT optical coherence tomography; IOP intraocular pressure; BRVO Branch retinal vein occlusion; CRVO central retinal vein occlusion; CRAO central retinal artery occlusion; BRAO branch retinal artery occlusion; RT retinal tear; SB scleral buckle; PPV pars plana vitrectomy; VH Vitreous hemorrhage; PRP panretinal laser photocoagulation; IVK intravitreal kenalog; VMT vitreomacular traction; MH Macular hole;  NVD neovascularization of the disc; NVE neovascularization elsewhere; AREDS age related eye disease study; ARMD age related macular degeneration; POAG primary open angle glaucoma; EBMD epithelial/anterior basement membrane dystrophy; ACIOL anterior chamber intraocular lens; IOL intraocular lens; PCIOL posterior chamber intraocular lens; Phaco/IOL phacoemulsification with intraocular lens placement; PRK photorefractive keratectomy; LASIK laser assisted in situ keratomileusis; HTN hypertension; DM diabetes mellitus; COPD chronic obstructive pulmonary disease

## 2020-09-27 NOTE — Assessment & Plan Note (Signed)
OS status post vitrectomy intraocular lens removal and placement of Yamane scleral tunnel supported [employer] CT [place] lens 2021 visit secondary CME has developed and yet is now controlled on topical long-term NSAIDs 2 or 3 times daily.  Patent PI.  No sign of iris chafe.  We will thus continue on topical NSAID twice daily

## 2020-10-09 IMAGING — DX DG LUMBAR SPINE COMPLETE 4+V
5 series · 5 of 5 positions shown · non-contrast
Comparison: None.

CLINICAL DATA: Low back pain after fall yesterday.

EXAM:
LUMBAR SPINE - COMPLETE 4+ VIEW

[t lumbar spine ap]
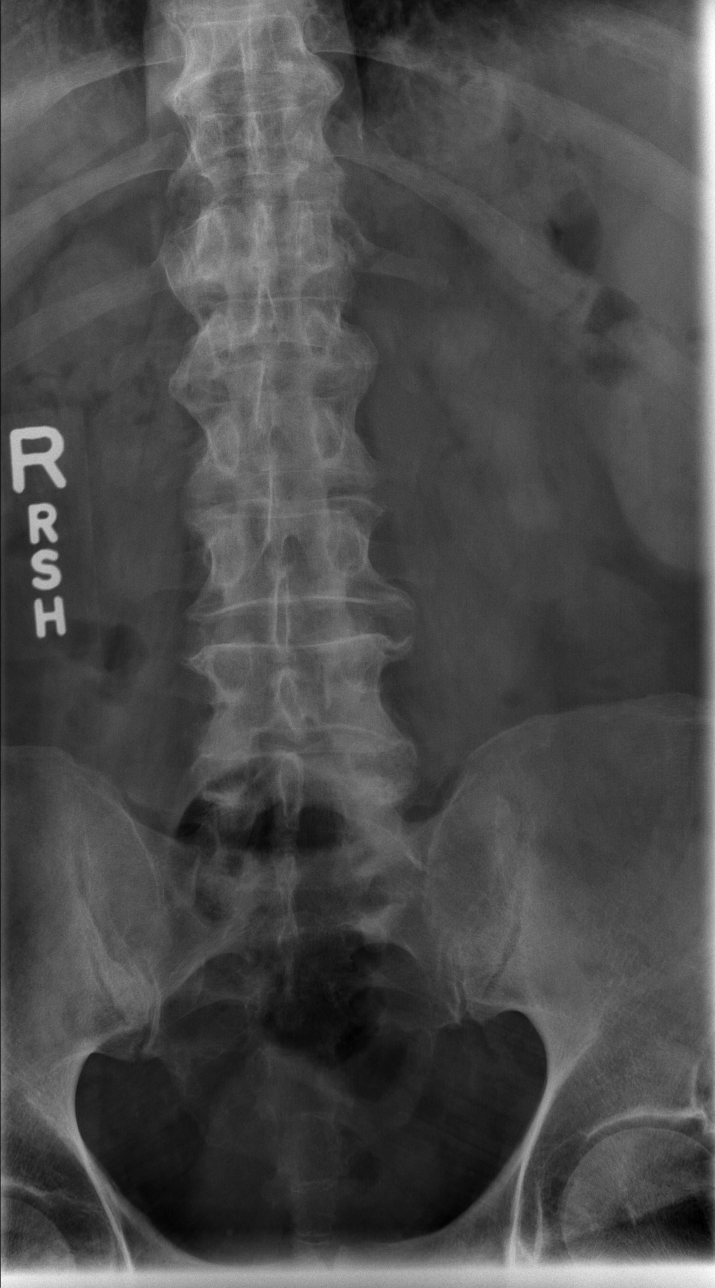

[t lumbar spine obl (1 of 2)]
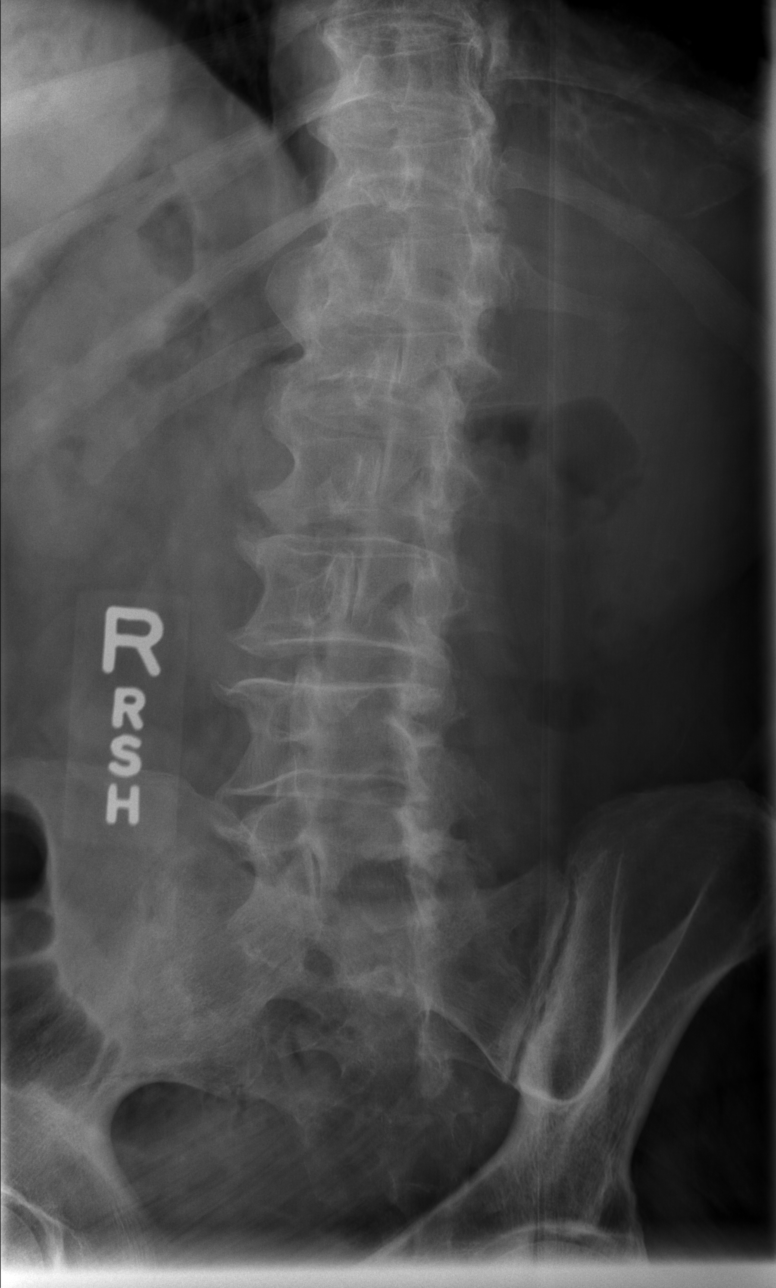

[t lumbar spine obl (2 of 2)]
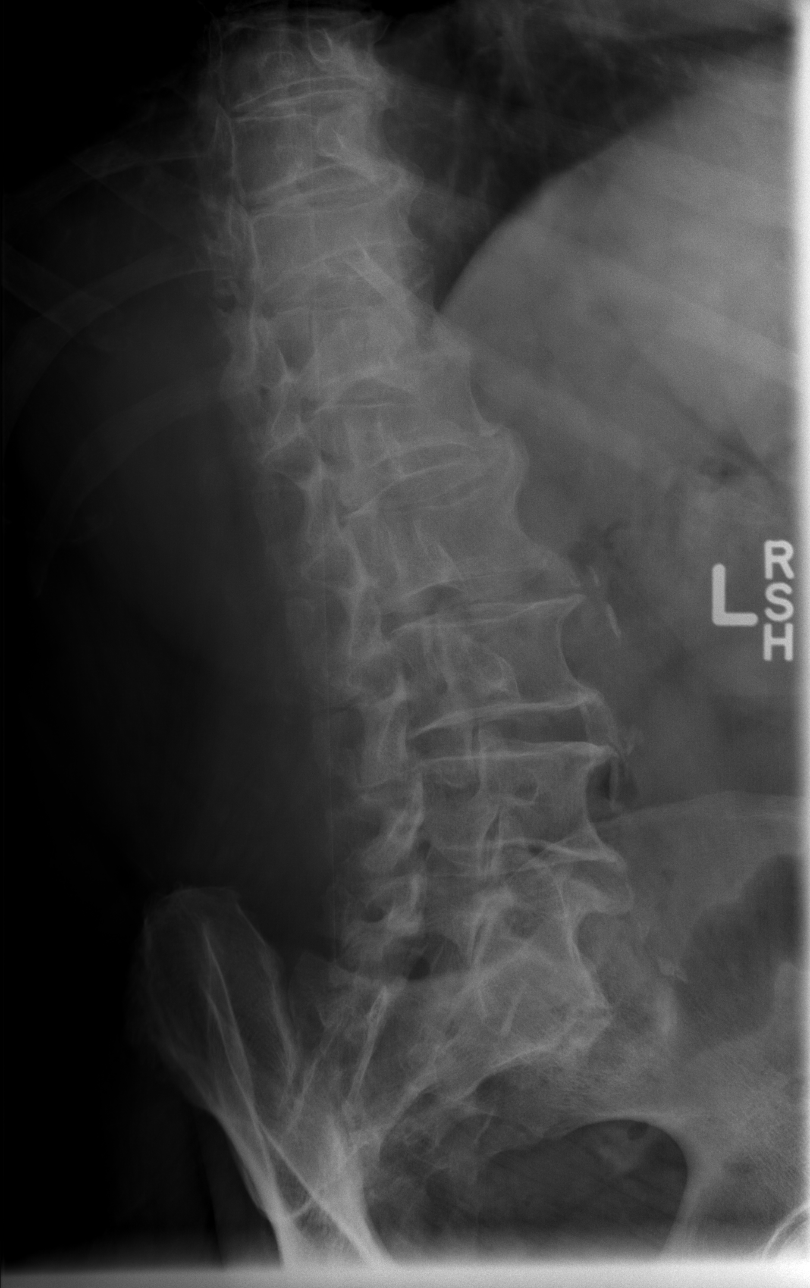

[t lumbar spine lat]
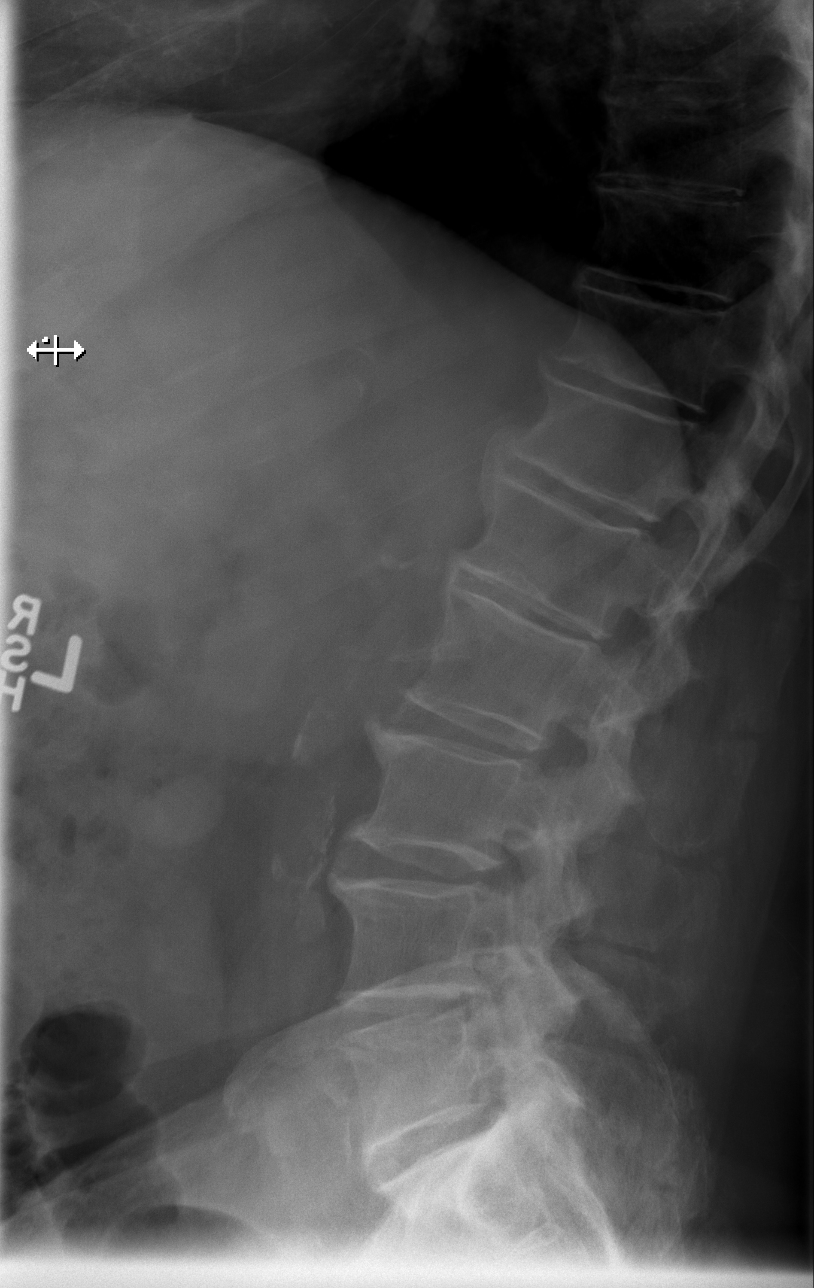

[t lumbar l-5 s-1 spot]
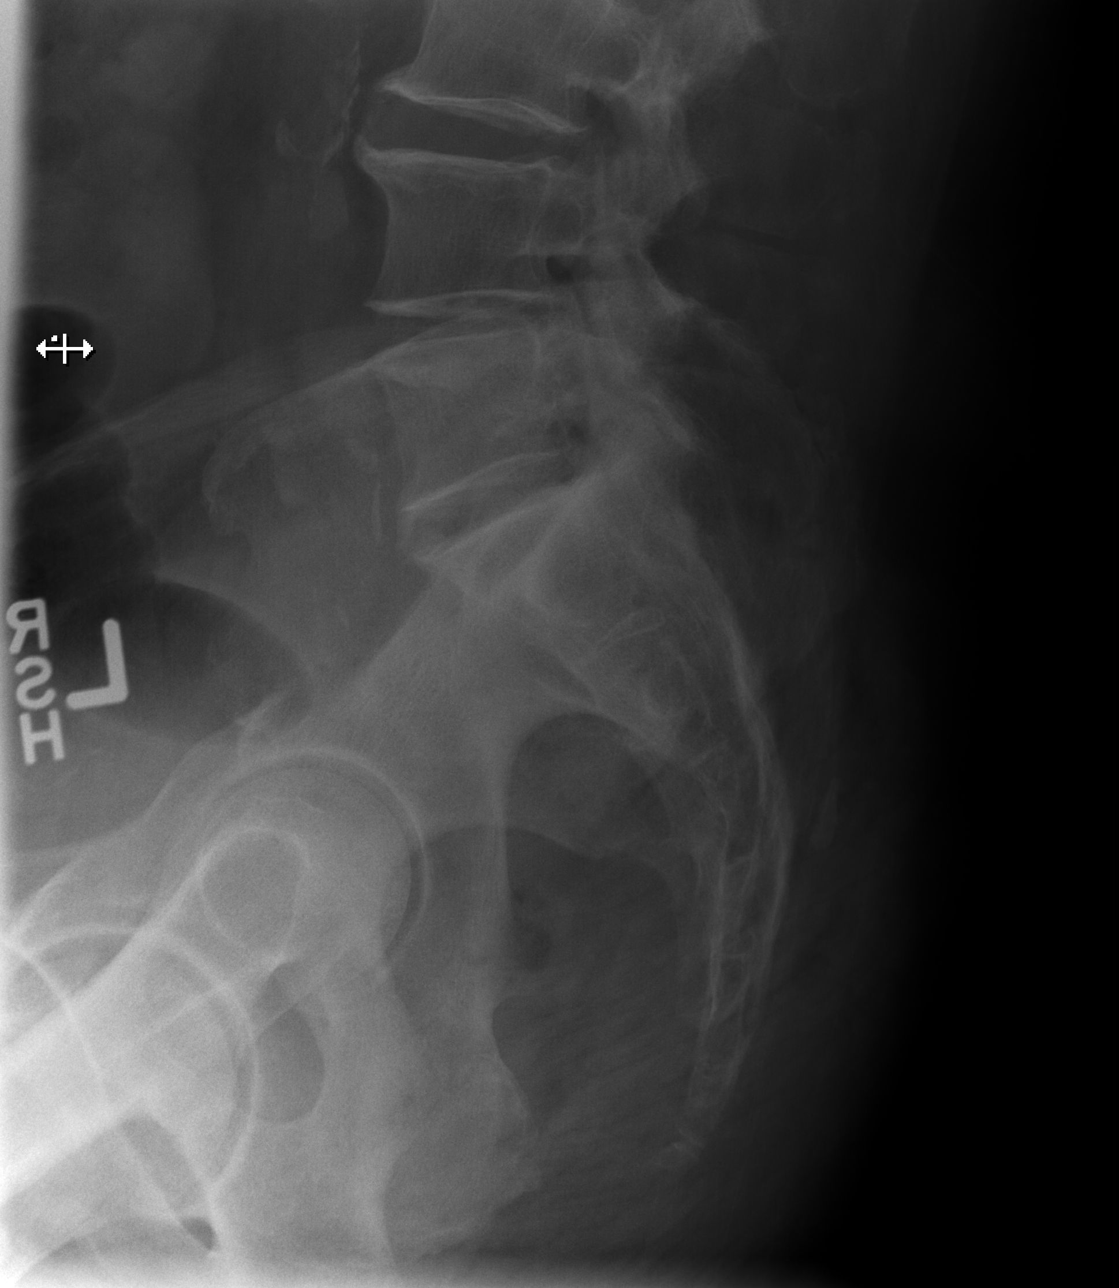

[5 of 5 positions shown; findings below may reference images not displayed]

FINDINGS: Five lumbar type vertebral bodies. No acute fracture or subluxation.
Vertebral body heights are preserved.

Alignment is normal. Moderate disc height loss at L4-L5. Lower
lumbar facet arthropathy.

The sacroiliac joints are unremarkable.
IMPRESSION: 1. No acute osseous abnormality.
2. Moderate degenerative disc disease at L4-L5.

## 2020-10-19 ENCOUNTER — Other Ambulatory Visit (INDEPENDENT_AMBULATORY_CARE_PROVIDER_SITE_OTHER): Payer: Self-pay | Admitting: Ophthalmology

## 2021-03-12 NOTE — Progress Notes (Deleted)
Cardiology Office Note:    Date:  03/12/2021   ID:  Jesse Torres, DOB [birth year], MRN [id number]  PCP:  Boykin Nearing, MD   [place]  Cardiologist:  Freada Bergeron, MD  Advanced Practice Provider:  No care team member to display Electrophysiologist:  None    Referring MD: Boykin Nearing, MD    History of Present Illness:    Jesse Torres is a 71 y.o. 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 Initially his ejection fraction was noted at 35%. There was no ischemia by past nuclear scan. Cardiac catheterization was never done. He is been very compliant with his medications and has had normalization of left ventricular function - last echo from March of 2016.    Last seen in clinic on 09/15/20 by Dr. Angelena Form where he was doing well. No HF symptoms. No changes in medications made.  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   Chronic systolic CHF (congestive heart failure) (HCC)    a. Echocardiogram (02/05/2013): Mild LVH, EF 25-30%, diffuse HK, mild MR, mild LAE, mild RVE, moderately reduced RVSF, mild RAE, PASP 64, trivial effusion.;  b.  Echo (04/2013): EF 30-35%, diffuse HK, Gr 2 DD, mod LAE, mildly reduced RVSF, mild RAE, PASP 46   Ejection fraction < 50%    Hypertension    Posterior vitreous detachment, left eye 01/26/2020   Pulmonary HTN (HCC)    Snoring    needs sleep study   Thigh shingles 01/04/2015   R L2 dermatome    Thrombocytopenia [facility])     Past Surgical History:  Procedure Laterality Date   Cataract surgery      Current Medications: No  outpatient medications have been marked as taking for the 03/16/21 encounter (Appointment) with Freada Bergeron, MD.     Allergies:   Patient has no known allergies.   Social History   Socioeconomic History   Marital status: Married    Spouse name: Not on file   Number of children: Not on file   Years of education: Not on file   Highest education level: Not on file  Occupational History   Not on file  Tobacco Use   Smoking status: Former    Types: Cigarettes    Quit date: 02/06/1995    Years since quitting: 26.1   Smokeless tobacco: Never  Vaping Use   Vaping Use: Unknown  Substance and Sexual Activity   Alcohol use: No   Drug use: No   Sexual activity: Not on file  Other Topics Concern   Not on file  Social History Narrative   Not on file   Social Determinants of Health   Financial Resource Strain: Not on file  Food Insecurity: Not on file  Transportation Needs: Not on file  Physical Activity: Not on file  Stress: Not on file  Social Connections: Not on file     Family History: The patient's family history includes Heart attack in his mother; Stroke in his father.  ROS:   Please see the history of present illness.    Review of Systems  Constitutional:  Negative for chills and fever.  HENT:  Negative for sore throat.   Eyes:  Negative for blurred vision and redness.  Respiratory:  Negative for shortness of breath.   Cardiovascular:  Negative for chest pain, palpitations, orthopnea, claudication, leg swelling and PND.  Gastrointestinal:  Negative for blood in stool, melena, nausea and vomiting.  Genitourinary:  Negative for hematuria.  Musculoskeletal:  Positive for joint pain.  Neurological:  Negative for dizziness and loss of consciousness.  Endo/Heme/Allergies:  Negative for polydipsia.  Psychiatric/Behavioral:  Negative for substance abuse.    EKGs/Labs/Other Studies Reviewed:    The following studies were reviewed today: TTE 05-30-2014: - Left  ventricle: The cavity size was normal. Wall thickness was    normal. Systolic function was normal. The estimated ejection    fraction was in the range of 60% to 65%. Wall motion was normal;    there were no regional wall motion abnormalities.  - Left atrium: The atrium was mildly dilated.  - Pulmonary arteries: Systolic pressure was mildly increased. PA    peak pressure: 33 mm Hg (S).    Recent Labs: 05/04/2020: BUN 20; Creatinine, Ser 1.07; Potassium 3.9; Sodium 137  Recent Lipid Panel    Component Value Date/Time   CHOL 119 01/27/2020 0906   TRIG 101 01/27/2020 0906   HDL 35 (L) 01/27/2020 0906   CHOLHDL 3.4 01/27/2020 0906   CHOLHDL 3.8 01/26/2016 0820   VLDL 24 01/26/2016 0820   LDLCALC 65 01/27/2020 0906     Physical Exam:    VS:  There were no vitals taken for this visit.    Wt Readings from Last 3 Encounters:  09/15/20 222 lb 9.6 oz (101 kg)  04/27/20 218 lb (98.9 kg)  01/27/20 223 lb (101.2 kg)     GEN:  Well nourished, well developed in no acute distress HEENT: Normal NECK: No JVD; No carotid bruits LYMPHATICS: No lymphadenopathy CARDIAC: Bradycardic, regular, no murmurs, rubs, gallops RESPIRATORY:  Clear to auscultation without rales, wheezing or rhonchi  ABDOMEN: Soft, non-tender, non-distended MUSCULOSKELETAL:  No edema; No deformity  SKIN: Warm and dry NEUROLOGIC:  Alert and oriented x 3 PSYCHIATRIC:  Normal affect   ASSESSMENT:    No diagnosis found.  PLAN:    In order of problems listed above:  #NICM/hypertensive CM/chronic systolic HF with recovered EF:  Presumed hypertension induced CM with initial EF 35% now recovered back to 60-65% on TTE in 05-30-14. Doing well on GDMT. NYHA class I/II symptoms. Euvolemic on exam today -Continue coreg 12.5mg  BID -Continue lisinopril 40mg  daily -Continue spiro 25mg  daily -Continue lasix 20mg  daily -Monitor weights -Low Na diet   #HTN: -Continue coreg 12.5mg  BID -Continue lisinopril 40mg  daily -Continue  spironolactone 25mg  daily -Continue amlodipine 2.5mg  daily   #HLD:  LDL 44. -Continue simva 20mg  daily  #Thrombocytopenia: Last plt count 94 omn 01/26/21. -Declined referral to heme -No ASA/AC   #CKD: Stable om labs on 01/26/21 with Cr 1.2 -Repeat BMET next week after increasing spiro   #DM:  Trying to manage with diet and is wanting to work on his weight.  -Follow-up with PCP as scheduled   #History of bradycardia  Asymptomatic -Continue coreg      Medication Adjustments/Labs and Tests Ordered: Current medicines are reviewed at length with the patient today.  Concerns regarding medicines are outlined above.  No orders of the defined types were placed in this encounter.  No orders of the defined types were placed in this encounter.   There are no Patient Instructions on file for  this visit.    Signed, Freada Bergeron, MD  03/12/2021 9:01 PM    [place]

## 2021-03-15 ENCOUNTER — Other Ambulatory Visit: Payer: Self-pay | Admitting: *Deleted

## 2021-03-15 DIAGNOSIS — N1831 Chronic kidney disease, stage 3a: Secondary | ICD-10-CM

## 2021-03-15 DIAGNOSIS — Z79899 Other long term (current) drug therapy: Secondary | ICD-10-CM

## 2021-03-15 DIAGNOSIS — I10 Essential (primary) hypertension: Secondary | ICD-10-CM

## 2021-03-15 MED ORDER — AMLODIPINE BESYLATE 2.5 MG PO TABS: 2.5000 mg | ORAL_TABLET | Freq: Every day | ORAL | 1 refills | Status: DC

## 2021-03-16 ENCOUNTER — Encounter (INDEPENDENT_AMBULATORY_CARE_PROVIDER_SITE_OTHER): Payer: Self-pay

## 2021-03-16 ENCOUNTER — Encounter: Payer: Self-pay | Admitting: Cardiology

## 2021-03-16 ENCOUNTER — Ambulatory Visit: Payer: Medicare HMO | Admitting: Cardiovascular Disease

## 2021-03-16 ENCOUNTER — Other Ambulatory Visit: Payer: Self-pay

## 2021-03-16 ENCOUNTER — Ambulatory Visit: Payer: Medicare HMO | Admitting: Cardiology

## 2021-03-16 VITALS — BP 124/74 | HR 54 | Ht 67.0 in | Wt 223.8 lb

## 2021-03-16 DIAGNOSIS — Z79899 Other long term (current) drug therapy: Secondary | ICD-10-CM

## 2021-03-16 DIAGNOSIS — N1831 Chronic kidney disease, stage 3a: Secondary | ICD-10-CM | POA: Diagnosis not present

## 2021-03-16 DIAGNOSIS — I10 Essential (primary) hypertension: Secondary | ICD-10-CM

## 2021-03-16 DIAGNOSIS — I2510 Atherosclerotic heart disease of native coronary artery without angina pectoris: Secondary | ICD-10-CM

## 2021-03-16 DIAGNOSIS — D696 Thrombocytopenia, unspecified: Secondary | ICD-10-CM | POA: Diagnosis not present

## 2021-03-16 DIAGNOSIS — E7800 Pure hypercholesterolemia, unspecified: Secondary | ICD-10-CM

## 2021-03-16 DIAGNOSIS — E1122 Type 2 diabetes mellitus with diabetic chronic kidney disease: Secondary | ICD-10-CM | POA: Diagnosis not present

## 2021-03-16 DIAGNOSIS — I420 Dilated cardiomyopathy: Secondary | ICD-10-CM | POA: Diagnosis not present

## 2021-03-16 DIAGNOSIS — I5022 Chronic systolic (congestive) heart failure: Secondary | ICD-10-CM | POA: Diagnosis not present

## 2021-03-16 DIAGNOSIS — E119 Type 2 diabetes mellitus without complications: Secondary | ICD-10-CM | POA: Diagnosis not present

## 2021-03-16 NOTE — Progress Notes (Signed)
Cardiology Office Note:    Date:  03/16/2021   ID:  [name], DOB 06-06-1950, MRN [id number]  PCP:  Dessa Phi, MD   [facility]  Cardiologist:  Meriam Sprague, MD  Advanced Practice Provider:  No care team member to display Electrophysiologist:  None    Referring MD: Dessa Phi, MD    History of Present Illness:    Jesse Torres is a 71 y.o. 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 Initially his ejection fraction was noted at 35%. There was no ischemia by past nuclear scan. Cardiac catheterization was never done. He is been very compliant with his medications and has had normalization of left ventricular function - last echo from March of 2016.    Last seen in clinic on 09/15/20 by Dr. Clifton James where he was doing well. No HF symptoms. No changes in medications made.  Today, the patient states that he overall is feeling well.  At home his blood pressure has been stable and well controlled, similar to today's reading of 124/74. He remains active with walking and generally has no concerning symptoms. He also assists with caring for his grandchildren.  Occasionally he may have some LE edema, but this improves by the next morning.  He denies any palpitations, chest pain, or shortness of breath. No lightheadedness, headaches, syncope, orthopnea, or PND.   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   Chronic systolic CHF (congestive heart failure) (HCC)    a. Echocardiogram (02/05/2013): Mild LVH, EF 25-30%, diffuse HK, mild MR, mild LAE, mild RVE, moderately reduced  RVSF, mild RAE, PASP 64, trivial effusion.;  b.  Echo (04/2013): EF 30-35%, diffuse HK, Gr 2 DD, mod LAE, mildly reduced RVSF, mild RAE, PASP 46   Ejection fraction < 50%    Hypertension    Posterior vitreous detachment, left eye 01/26/2020   Pulmonary HTN (HCC)    Snoring    needs sleep study   Thigh shingles 01/04/2015   R L2 dermatome    Thrombocytopenia (HCC)     Past Surgical History:  Procedure Laterality Date   Cataract surgery      Current Medications: Current Meds  Medication Sig   acetaminophen (TYLENOL) 500 MG tablet Take 1 tablet (500 mg total) by mouth every 6 (six) hours as needed.   amLODipine (NORVASC) 2.5 MG tablet Take 1 tablet (2.5 mg total) by mouth daily.   carvedilol (COREG) 12.5 MG tablet Take 1 tablet (12.5 mg total) by mouth 2 (two) times daily with a meal.   furosemide (LASIX) 20 MG tablet Take 1 tablet (20 mg total) by mouth daily.   glucose blood (ACCU-CHEK GUIDE) test strip Use as instructed twice per day and as directed   ketorolac (ACULAR) 0.5 % ophthalmic solution INSTILL 1 DROP INTO LEFT EYE TWICE DAILY   lisinopril (ZESTRIL) 40 MG tablet Take 1 tablet (40 mg total) by mouth 2 (two) times daily.   simvastatin (ZOCOR) 5 MG tablet Take 1 tablet (5 mg total) by mouth daily.   spironolactone (ALDACTONE) 25 MG tablet Take 1 tablet (25 mg total) by mouth daily.     Allergies:  Patient has no known allergies.   Social History   Socioeconomic History   Marital status: Married    Spouse name: Not on file   Number of children: Not on file   Years of education: Not on file   Highest education level: Not on file  Occupational History   Not on file  Tobacco Use   Smoking status: Former    Types: Cigarettes    Quit date: 02/06/1995    Years since quitting: 26.1   Smokeless tobacco: Never  Vaping Use   Vaping Use: Unknown  Substance and Sexual Activity   Alcohol use: No   Drug use: No   Sexual activity: Not on file  Other Topics Concern   Not  on file  Social History Narrative   Not on file   Social Determinants of Health   Financial Resource Strain: Not on file  Food Insecurity: Not on file  Transportation Needs: Not on file  Physical Activity: Not on file  Stress: Not on file  Social Connections: Not on file     Family History: The patient's family history includes Heart attack in his mother; Stroke in his father.  ROS:   Please see the history of present illness.    Review of Systems  Constitutional:  Negative for chills and fever.  HENT:  Negative for sore throat.   Eyes:  Negative for blurred vision and redness.  Respiratory:  Negative for shortness of breath.   Cardiovascular:  Positive for leg swelling. Negative for chest pain, palpitations, orthopnea, claudication and PND.  Gastrointestinal:  Negative for blood in stool, melena, nausea and vomiting.  Genitourinary:  Negative for hematuria.  Musculoskeletal:  Positive for joint pain.  Neurological:  Negative for dizziness and loss of consciousness.  Endo/Heme/Allergies:  Negative for polydipsia.  Psychiatric/Behavioral:  Negative for substance abuse.    EKGs/Labs/Other Studies Reviewed:    The following studies were reviewed today:  TTE 2016: - Left ventricle: The cavity size was normal. Wall thickness was    normal. Systolic function was normal. The estimated ejection    fraction was in the range of 60% to 65%. Wall motion was normal;    there were no regional wall motion abnormalities.  - Left atrium: The atrium was mildly dilated.  - Pulmonary arteries: Systolic pressure was mildly increased. PA    peak pressure: 33 mm Hg (S).   Nuclear Stress 02/07/2013: IMPRESSION:  1. 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 There is no significant reversibility.   2. Systolic LV dysfunction with EF 34% with moderate apical  hypokinesis.   EKG:  EKG is personally reviewed. 03/16/2021: Sinus rhythm. RBBB.  Recent  Labs: 05/04/2020: BUN 20; Creatinine, Ser 1.07; Potassium 3.9; Sodium 137   Recent Lipid Panel    Component Value Date/Time   CHOL 119 01/27/2020 0906   TRIG 101 01/27/2020 0906   HDL 35 (L) 01/27/2020 0906   CHOLHDL 3.4 01/27/2020 0906   CHOLHDL 3.8 01/26/2016 0820   VLDL 24 01/26/2016 0820   LDLCALC 65 01/27/2020 0906     Physical Exam:    VS:  BP 124/74    Pulse (!) 54    Ht 5\' 7"  (1.702 m)    Wt 223 lb 12.8 oz (101.5 kg)    SpO2 96%    BMI 35.05 kg/m     Wt Readings from Last 3 Encounters:  03/16/21 223 lb 12.8 oz (101.5 kg)  09/15/20 222 lb 9.6 oz (  101 kg)  04/27/20 218 lb (98.9 kg)     GEN:  Well nourished, well developed in no acute distress HEENT: Normal NECK: No JVD; No carotid bruits CARDIAC: Bradycardic, regular, no murmurs, rubs, gallops RESPIRATORY:  Clear to auscultation without rales, wheezing or rhonchi  ABDOMEN: Soft, non-tender, non-distended MUSCULOSKELETAL:  No edema; No deformity  SKIN: Warm and dry NEUROLOGIC:  Alert and oriented x 3 PSYCHIATRIC:  Normal affect   ASSESSMENT:    1. Chronic systolic CHF (congestive heart failure) ([place])   2. Medication management   3. Pure hypercholesterolemia   4. Coronary artery disease involving native heart without angina pectoris, unspecified vessel or lesion type   5. Essential hypertension   6. Thrombocytopenia (HCC)   7. Stage 3a chronic kidney disease ([place])   8. Diabetes mellitus with coincident hypertension ([place])   9. Dilated cardiomyopathy ([place])     PLAN:    In order of problems listed above:  #NICM/hypertensive CM/chronic systolic HF with recovered EF:  Presumed hypertension induced CM with initial EF 35% now recovered back to 60-65% on TTE in 2016. Doing well on GDMT. NYHA class I/II symptoms. Euvolemic on exam today -Continue coreg 12.5mg  BID -Continue lisinopril 40mg  daily -Continue spiro 25mg  daily -Continue lasix 20mg  daily -Monitor weights -Low Na diet   #HTN: Very well controlled  and at goal <120/80s. -Continue coreg 12.5mg  BID -Continue lisinopril 40mg  daily -Continue spironolactone 25mg  daily -Continue amlodipine 2.5mg  daily   #HLD:  Has not had lipid panel since 2021. Previously well controlled with LDL 65 -Continue simva 20mg  daily -Repeat lipids next week  #Thrombocytopenia: Last plt count 94 omn 01/26/21. -Declined referral to heme -No ASA/AC   #CKD: Stable om labs on 01/26/21 with Cr 1.2 -Monitor   #DM:  Trying to manage with diet and is wanting to work on his weight.  -Follow-up with PCP as scheduled   #History of bradycardia  Asymptomatic -Continue coreg   Follow-up:  6 months   Medication Adjustments/Labs and Tests Ordered: Current medicines are reviewed at length with the patient today.  Concerns regarding medicines are outlined above.   Orders Placed This Encounter  Procedures   Lipid Profile   EKG 12-Lead   No orders of the defined types were placed in this encounter.  Patient Instructions  Medication Instructions:   Your physician recommends that you continue on your current medications as directed. Please refer to the Current Medication list given to you today.  *If you need a refill on your cardiac medications before your next appointment, please call your pharmacy*   Lab Work:  NEXT WEEK HERE IN THE OFFICE TO CHECK LIPIDS--PLEASE COME FASTING TO THIS LAB APPOINTMENT  If you have labs (blood work) drawn today and your tests are completely normal, you will receive your results only by: [place] (if you have MyChart) OR A paper copy in the mail If you have any lab test that is abnormal or we need to change your treatment, we will call you to review the results.   Follow-Up: At [facility], you and your health needs are our priority.  As part of our continuing mission to provide you with exceptional heart care, we have created designated Provider Care Teams.  These Care Teams include your primary Cardiologist  (physician) and Advanced Practice Providers (APPs -  Physician Assistants and Nurse Practitioners) who all work together to provide you with the care you need, when you need it.  We recommend signing up for the patient  portal called "MyChart".  Sign up information is provided on this After Visit Summary.  MyChart is used to connect with patients for Virtual Visits (Telemedicine).  Patients are able to view lab/test results, encounter notes, upcoming appointments, etc.  Non-urgent messages can be sent to your provider as well.   To learn more about what you can do with MyChart, go to [email or web address].    Your next appointment:   6 month(s)  The format for your next appointment:   In Person  Provider:   Freada Bergeron, MD {     [facility],acting as a scribe for Freada Bergeron, MD.,have documented all relevant documentation on the behalf of Freada Bergeron, MD,as directed by  Freada Bergeron, MD while in the presence of Freada Bergeron, MD.  I, Freada Bergeron, MD, have reviewed all documentation for this visit. The documentation on 03/16/21 for the exam, diagnosis, procedures, and orders are all accurate and complete.    Signed, Freada Bergeron, MD  03/16/2021 8:21 AM    [facility]

## 2021-03-16 NOTE — Patient Instructions (Signed)
Medication Instructions:   Your physician recommends that you continue on your current medications as directed. Please refer to the Current Medication list given to you today.  *If you need a refill on your cardiac medications before your next appointment, please call your pharmacy*   Lab Work:  NEXT WEEK HERE IN THE OFFICE TO CHECK LIPIDS--PLEASE COME FASTING TO THIS LAB APPOINTMENT  If you have labs (blood work) drawn today and your tests are completely normal, you will receive your results only by: MyChart Message (if you have MyChart) OR A paper copy in the mail If you have any lab test that is abnormal or we need to change your treatment, we will call you to review the results.   Follow-Up: At [facility], you and your health needs are our priority.  As part of our continuing mission to provide you with exceptional heart care, we have created designated Provider Care Teams.  These Care Teams include your primary Cardiologist (physician) and Advanced Practice Providers (APPs -  Physician Assistants and Nurse Practitioners) who all work together to provide you with the care you need, when you need it.  We recommend signing up for the patient portal called "MyChart".  Sign up information is provided on this After Visit Summary.  MyChart is used to connect with patients for Virtual Visits (Telemedicine).  Patients are able to view lab/test results, encounter notes, upcoming appointments, etc.  Non-urgent messages can be sent to your provider as well.   To learn more about what you can do with MyChart, go to [email or web address].    Your next appointment:   6 month(s)  The format for your next appointment:   In Person  Provider:   Meriam Sprague, MD {

## 2021-03-24 ENCOUNTER — Other Ambulatory Visit: Payer: Medicare HMO

## 2021-03-24 ENCOUNTER — Other Ambulatory Visit: Payer: Self-pay

## 2021-03-24 DIAGNOSIS — E7800 Pure hypercholesterolemia, unspecified: Secondary | ICD-10-CM | POA: Diagnosis not present

## 2021-03-24 DIAGNOSIS — Z79899 Other long term (current) drug therapy: Secondary | ICD-10-CM

## 2021-03-24 DIAGNOSIS — I2510 Atherosclerotic heart disease of native coronary artery without angina pectoris: Secondary | ICD-10-CM

## 2021-03-24 LAB — LIPID PANEL
Chol/HDL Ratio: 3.3 ratio (ref 0.0–5.0)
Cholesterol, Total: 97 mg/dL — ABNORMAL LOW (ref 100–199)
HDL: 29 mg/dL — ABNORMAL LOW (ref >39)
LDL Chol Calc (NIH): 48 mg/dL (ref 0–99)
Triglycerides: 104 mg/dL (ref 0–149)
VLDL Cholesterol Cal: 20 mg/dL (ref 5–40)

## 2021-04-04 ENCOUNTER — Ambulatory Visit (INDEPENDENT_AMBULATORY_CARE_PROVIDER_SITE_OTHER): Payer: Medicare HMO | Admitting: Ophthalmology

## 2021-04-04 ENCOUNTER — Other Ambulatory Visit: Payer: Self-pay

## 2021-04-04 ENCOUNTER — Encounter (INDEPENDENT_AMBULATORY_CARE_PROVIDER_SITE_OTHER): Payer: Self-pay | Admitting: Ophthalmology

## 2021-04-04 DIAGNOSIS — H43312 Vitreous membranes and strands, left eye: Secondary | ICD-10-CM

## 2021-04-04 DIAGNOSIS — H43822 Vitreomacular adhesion, left eye: Secondary | ICD-10-CM

## 2021-04-04 DIAGNOSIS — H35352 Cystoid macular degeneration, left eye: Secondary | ICD-10-CM

## 2021-04-04 NOTE — Assessment & Plan Note (Signed)
Condition resolved 

## 2021-04-04 NOTE — Progress Notes (Signed)
04/04/2021     CHIEF COMPLAINT Patient presents for  Chief Complaint  Patient presents with   Retina Evaluation      HISTORY OF PRESENT ILLNESS: Jesse Torres is a 71 y.o. male who presents to the clinic today for:   HPI     Retina Evaluation           Laterality: both eyes         Comments   No change in vision over the last year, and no interval change in medical hx, as patient continues on topical ketorolac twice daily OS  Past history of pseudophakia with dislocated IOL, and no posterior capsule support  Post past history of pseudophakia restoration using Yamane scleral tunnel suspended Zeiss CT [place].  Ongoing chronic CME  no interval change, and controlled in the past with twice daily ketorolac      Last edited by Hurman Horn, MD on 04/04/2021  8:37 AM.      Referring physician: Boykin Nearing, MD No address on file  HISTORICAL INFORMATION:   Selected notes from the [place]    Lab Results  Component Value Date   HGBA1C 6.5 (H) 01/27/2020     CURRENT MEDICATIONS: Current Outpatient Medications (Ophthalmic Drugs)  Medication Sig   ketorolac (ACULAR) 0.5 % ophthalmic solution INSTILL 1 DROP INTO LEFT EYE TWICE DAILY   No current facility-administered medications for this visit. (Ophthalmic Drugs)   Current Outpatient Medications (Other)  Medication Sig   acetaminophen (TYLENOL) 500 MG tablet Take 1 tablet (500 mg total) by mouth every 6 (six) hours as needed.   amLODipine (NORVASC) 2.5 MG tablet Take 1 tablet (2.5 mg total) by mouth daily.   carvedilol (COREG) 12.5 MG tablet Take 1 tablet (12.5 mg total) by mouth 2 (two) times daily with a meal.   furosemide (LASIX) 20 MG tablet Take 1 tablet (20 mg total) by mouth daily.   glucose blood (ACCU-CHEK GUIDE) test strip Use as instructed twice per day and as directed   lisinopril (ZESTRIL) 40 MG tablet Take 1 tablet (40 mg total) by mouth 2 (two) times daily.   simvastatin (ZOCOR)  5 MG tablet Take 1 tablet (5 mg total) by mouth daily.   spironolactone (ALDACTONE) 25 MG tablet Take 1 tablet (25 mg total) by mouth daily.   No current facility-administered medications for this visit. (Other)      REVIEW OF SYSTEMS: ROS   Negative for: Constitutional, Gastrointestinal, Neurological, Skin, Genitourinary, Musculoskeletal, HENT, Endocrine, Cardiovascular, Eyes, Respiratory, Psychiatric, Allergic/Imm, Heme/Lymph Last edited by Silvestre Moment on 04/04/2021  8:18 AM.       ALLERGIES No Known Allergies  PAST MEDICAL HISTORY Past Medical History:  Diagnosis Date   Cardiomyopathy ([place])    probable HTN cardiomyopathy;  Lexiscan Myoview (02/07/2013): EF 34%, old small apical infarct, no ischemia, moderate apical hypokinesis.   Chronic systolic CHF (congestive heart failure) (HCC)    a. Echocardiogram (02/05/2013): Mild LVH, EF 25-30%, diffuse HK, mild MR, mild LAE, mild RVE, moderately reduced RVSF, mild RAE, PASP 64, trivial effusion.;  b.  Echo (04/2013): EF 30-35%, diffuse HK, Gr 2 DD, mod LAE, mildly reduced RVSF, mild RAE, PASP 46   Ejection fraction < 50%    Hypertension    Posterior vitreous detachment, left eye 01/26/2020   Pulmonary HTN (HCC)    Snoring    needs sleep study   Thigh shingles 01/04/2015   R L2 dermatome    Thrombocytopenia ([place])  Past Surgical History:  Procedure Laterality Date   Cataract surgery      FAMILY HISTORY Family History  Problem Relation Age of Onset   Heart attack Mother    Stroke Father     SOCIAL HISTORY Social History   Tobacco Use   Smoking status: Former    Types: Cigarettes    Quit date: 02/06/1995    Years since quitting: 26.1   Smokeless tobacco: Never  Vaping Use   Vaping Use: Unknown  Substance Use Topics   Alcohol use: No   Drug use: No         OPHTHALMIC EXAM:  Base Eye Exam     Visual Acuity (Snellen - Linear)       Right Left   Dist [place] 20/15 -2 20/25 -2         Tonometry (Tonopen, 8:15  AM)       Right Left   Pressure 21 9         Pupils       Pupils   Right PERRL   Left PERRL         Visual Fields       Left Right    Full Full         Extraocular Movement       Right Left    Full, Ortho Full, Ortho         Neuro/Psych     Oriented x3: Yes   Mood/Affect: Normal         Dilation     Both eyes: 1.0% Mydriacyl, 2.5% Phenylephrine @ 8:13 AM           Slit Lamp and Fundus Exam     External Exam       Right Left   External Normal Normal         Slit Lamp Exam       Right Left   Lids/Lashes Normal Normal   Conjunctiva/Sclera White and quiet good coverage, no exposed haptics, visible haptic's at 11 and 5, subconjunctival   Cornea Clear Clear   Anterior Chamber Deep and quiet Deep and quiet, no cells no pigment   Iris Round and reactive Inferior PI stable, no visible chafe   Lens Clear Centered posterior chamber intraocular lens   Anterior Vitreous Normal Normal, clear vitrectomized, no pigment         Fundus Exam       Right Left   Posterior Vitreous Normal Normal clear vitrectomized, no pigment   Disc Normal Normal   C/D Ratio 0.35 0.35   Macula Normal, , no macular thickening Normal, , no macular thickening   Vessels Normal Normal   Periphery Normal, no holes or tears Normal            IMAGING AND PROCEDURES  Imaging and Procedures for 04/04/21           ASSESSMENT/PLAN:  Vitreomacular adhesion of left eye Condition resolved  Vitreous membranes and strands, left Condition resolved  Cystoid macular edema of left eye Condition stabilized and controlled with topical ketorolac twice daily     ICD-10-CM   1. Vitreomacular adhesion of left eye  H43.822     2. Vitreous membranes and strands, left  H43.312     3. Cystoid macular edema of left eye  H35.352       1.  OS, looks great, stabilized acuity.  CME at risk long-term because of Yamane scleral tunnel suspended Zeiss CT [place] lens,  will  continue on topical NSAID twice daily indefinitely  2.  Follow-up Dr. Katy Fitch as scheduled  3.  Follow-up here every 6 months  Ophthalmic Meds Ordered this visit:  No orders of the defined types were placed in this encounter.      Return in about 6 months (around 10/02/2021) for DILATE OU, COLOR FP, OD, OS.  There are no Patient Instructions on file for this visit.   Explained the diagnoses, plan, and follow up with the patient and they expressed understanding.  Patient expressed understanding of the importance of proper follow up care.   Clent Demark Beldon Nowling M.D. Diseases & Surgery of the Retina and Vitreous [facility] 04/04/21     Abbreviations: M myopia (nearsighted); A astigmatism; H hyperopia (farsighted); P presbyopia; Mrx spectacle prescription;  CTL contact lenses; OD right eye; OS left eye; OU both eyes  XT exotropia; ET esotropia; PEK punctate epithelial keratitis; PEE punctate epithelial erosions; DES dry eye syndrome; MGD meibomian gland dysfunction; ATs artificial tears; PFAT's preservative free artificial tears; [place] nuclear sclerotic cataract; PSC posterior subcapsular cataract; ERM epi-retinal membrane; PVD posterior vitreous detachment; RD retinal detachment; DM diabetes mellitus; DR diabetic retinopathy; NPDR non-proliferative diabetic retinopathy; PDR proliferative diabetic retinopathy; CSME clinically significant macular edema; DME diabetic macular edema; dbh dot blot hemorrhages; CWS cotton wool spot; POAG primary open angle glaucoma; C/D cup-to-disc ratio; HVF humphrey visual field; GVF goldmann visual field; OCT optical coherence tomography; IOP intraocular pressure; BRVO Branch retinal vein occlusion; CRVO central retinal vein occlusion; CRAO central retinal artery occlusion; BRAO branch retinal artery occlusion; RT retinal tear; SB scleral buckle; PPV pars plana vitrectomy; VH Vitreous hemorrhage; PRP panretinal laser photocoagulation; IVK intravitreal  kenalog; VMT vitreomacular traction; MH Macular hole;  NVD neovascularization of the disc; NVE neovascularization elsewhere; AREDS age related eye disease study; ARMD age related macular degeneration; POAG primary open angle glaucoma; EBMD epithelial/anterior basement membrane dystrophy; ACIOL anterior chamber intraocular lens; IOL intraocular lens; PCIOL posterior chamber intraocular lens; Phaco/IOL phacoemulsification with intraocular lens placement; [place] photorefractive keratectomy; LASIK laser assisted in situ keratomileusis; HTN hypertension; DM diabetes mellitus; COPD chronic obstructive pulmonary disease

## 2021-04-04 NOTE — Assessment & Plan Note (Signed)
Condition stabilized and controlled with topical ketorolac twice daily

## 2021-05-19 ENCOUNTER — Other Ambulatory Visit: Payer: Self-pay

## 2021-05-19 DIAGNOSIS — I10 Essential (primary) hypertension: Secondary | ICD-10-CM

## 2021-05-19 DIAGNOSIS — Z79899 Other long term (current) drug therapy: Secondary | ICD-10-CM

## 2021-05-19 DIAGNOSIS — N1831 Chronic kidney disease, stage 3a: Secondary | ICD-10-CM

## 2021-05-19 MED ORDER — SPIRONOLACTONE 25 MG PO TABS: 25.0000 mg | ORAL_TABLET | Freq: Every day | ORAL | 3 refills | Status: DC

## 2021-08-01 ENCOUNTER — Other Ambulatory Visit: Payer: Self-pay

## 2021-08-01 DIAGNOSIS — I420 Dilated cardiomyopathy: Secondary | ICD-10-CM

## 2021-08-01 DIAGNOSIS — I5022 Chronic systolic (congestive) heart failure: Secondary | ICD-10-CM

## 2021-08-01 DIAGNOSIS — R7989 Other specified abnormal findings of blood chemistry: Secondary | ICD-10-CM

## 2021-08-01 DIAGNOSIS — I10 Essential (primary) hypertension: Secondary | ICD-10-CM

## 2021-08-01 DIAGNOSIS — R799 Abnormal finding of blood chemistry, unspecified: Secondary | ICD-10-CM

## 2021-08-01 DIAGNOSIS — E7800 Pure hypercholesterolemia, unspecified: Secondary | ICD-10-CM

## 2021-08-01 MED ORDER — FUROSEMIDE 20 MG PO TABS: 20.0000 mg | ORAL_TABLET | Freq: Every day | ORAL | 0 refills | Status: DC

## 2021-08-01 MED ORDER — SIMVASTATIN 5 MG PO TABS: 5.0000 mg | ORAL_TABLET | Freq: Every day | ORAL | 0 refills | Status: DC

## 2021-08-03 ENCOUNTER — Other Ambulatory Visit: Payer: Self-pay

## 2021-08-03 MED ORDER — CARVEDILOL 12.5 MG PO TABS: 12.5000 mg | ORAL_TABLET | Freq: Two times a day (BID) | ORAL | 2 refills | Status: DC

## 2021-08-03 MED ORDER — LISINOPRIL 40 MG PO TABS: 40.0000 mg | ORAL_TABLET | Freq: Two times a day (BID) | ORAL | 2 refills | Status: DC

## 2021-08-17 ENCOUNTER — Other Ambulatory Visit: Payer: Self-pay | Admitting: Cardiovascular Disease

## 2021-08-17 DIAGNOSIS — N1831 Chronic kidney disease, stage 3a: Secondary | ICD-10-CM

## 2021-08-17 DIAGNOSIS — Z79899 Other long term (current) drug therapy: Secondary | ICD-10-CM

## 2021-08-17 DIAGNOSIS — I10 Essential (primary) hypertension: Secondary | ICD-10-CM

## 2021-09-25 DIAGNOSIS — E7800 Pure hypercholesterolemia, unspecified: Secondary | ICD-10-CM | POA: Insufficient documentation

## 2021-09-25 NOTE — Progress Notes (Signed)
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 male with the above problem list.  He was last seen by Dr. Johney Frame in Feb 2023. He returns for f/u. He is here alone. He denies chest pain, shortness of breath, syncope, orthopnea, PND or significant pedal edema.         Past Medical History:  Diagnosis Date   Cardiomyopathy ([place])    probable HTN cardiomyopathy;  Lexiscan Myoview (02/07/2013): EF 34%, old small apical infarct, no ischemia, moderate apical hypokinesis.   Chronic systolic CHF (congestive heart failure) (HCC)    a. Echocardiogram (02/05/2013): Mild LVH, EF 25-30%, diffuse HK, mild MR, mild LAE, mild RVE, moderately reduced RVSF, mild RAE, PASP 64, trivial effusion.;  b.  Echo (04/2013): EF 30-35%, diffuse HK, Gr 2 DD, mod LAE, mildly reduced RVSF, mild RAE, PASP 46   Ejection fraction < 50%    Hypertension    Posterior vitreous detachment, left eye 01/26/2020   Pulmonary HTN (HCC)    Snoring    needs sleep study   Thigh shingles 01/04/2015   R L2 dermatome    Thrombocytopenia (HCC)    Current Medications: Current Meds  Medication Sig   acetaminophen (TYLENOL) 500 MG tablet Take 1 tablet  (500 mg total) by mouth every 6 (six) hours as needed.   amLODipine (NORVASC) 2.5 MG tablet TAKE 1 TABLET (2.5 MG TOTAL) BY MOUTH DAILY.   carvedilol (COREG) 12.5 MG tablet Take 1 tablet (12.5 mg total) by mouth 2 (two) times daily with a meal.   furosemide (LASIX) 20 MG tablet Take 1 tablet (20 mg total) by mouth daily. Please call [phone] to schedule an appointment for future refills. Thank you.   glucose blood (ACCU-CHEK GUIDE) test strip Use as instructed twice per day and as directed   ketorolac (ACULAR) 0.5 % ophthalmic solution INSTILL 1 DROP INTO LEFT EYE TWICE DAILY   lisinopril (ZESTRIL) 40 MG tablet Take 1 tablet (40 mg total) by mouth 2 (two) times daily.   simvastatin (ZOCOR) 5 MG tablet Take 1 tablet (5 mg total) by mouth daily. Please call [phone] to schedule an appointment for future refills. Thank you.   spironolactone (ALDACTONE) 25 MG tablet Take 1 tablet (25 mg total) by mouth daily.    Allergies:   Patient has no known allergies.   Social History   Tobacco Use   Smoking status: Former    Types: Cigarettes    Quit date: 02/06/1995    Years since quitting: 26.6   Smokeless tobacco: Never  Vaping Use   Vaping Use: Unknown  Substance Use Topics   Alcohol use: No   Drug use: No    Family Hx: The patient's family  history includes Heart attack in his mother; Stroke in his father.  Review of Systems  Cardiovascular:  Negative for claudication.  Hematologic/Lymphatic: Does not bruise/bleed easily.  Gastrointestinal:  Negative for hematochezia.  Genitourinary:  Negative for hematuria.     EKGs/Labs/Other Test Reviewed:    EKG:  EKG is not ordered today.  The ekg ordered today demonstrates n/a  Recent Labs: No results found for requested labs within last 365 days.   Recent Lipid Panel Recent Labs    03/24/21 0948  CHOL 97*  TRIG 104  HDL 29*  LDLCALC 48     Risk Assessment/Calculations/Metrics:              Physical Exam:    VS:  BP  112/60   Pulse 64   Ht $R'5\' 7"'ju$  (1.702 m)   Wt 220 lb 6.4 oz (100 kg)   SpO2 97%   BMI 34.52 kg/m     Wt Readings from Last 3 Encounters:  09/26/21 220 lb 6.4 oz (100 kg)  03/16/21 223 lb 12.8 oz (101.5 kg)  09/15/20 222 lb 9.6 oz (101 kg)    Constitutional:      Appearance: Healthy appearance. Not in distress.  Neck:     Vascular: No carotid bruit. JVD normal.  Pulmonary:     Effort: Pulmonary effort is normal.     Breath sounds: No wheezing. No rales.  Cardiovascular:     Normal rate. Regular rhythm. Normal S1. Normal S2.      Murmurs: There is no murmur.  Edema:    Peripheral edema absent.  Abdominal:     Palpations: Abdomen is soft.  Skin:    General: Skin is warm and dry.  Neurological:     General: No focal deficit present.     Mental Status: Alert and oriented to person, place and time.         ASSESSMENT & PLAN:   HFimpEF (heart failure with improved EF EF improved from 25-30 to 60-65 by echocardiogram in 2016. He is doing well without signs or symptoms to suggest volume excess. NYHA II. Continue with GDMT: Corege 12.5 mg twice daily, Lasix 20 mg once daily, Lisinopril 40 mg once daily, Spironolactone 25 mg once daily. F/u 6 mos. Labs prior to OV in 6 mos: CMET, fasting Lipids.   Essential hypertension The patient's blood pressure is controlled on his current regimen.  Continue current therapy with Norvasc 2.5 mg once daily, Carvedilol 12.5 mg twice daily, Lisinopril 40 mg once daily, Spironolactone 25 mg once daily. Obtain CMET prior to next visit.   Pure hypercholesterolemia LDL optimal on most recent lab work.  Continue current Rx with Simvastatin 5 mg once daily.   Thrombocytopenia No signs of bruising or bleeding. Obtain CBC prior to next OV.   Type 2 diabetes mellitus without complication (HCC) Diet controlled. He does not have regular follow up with primary care. Will get CMET, Hgb A1c prior to next OV.             Dispo:  Return in about 6 months  (around 03/29/2022) for Routine Follow Up, w/ Dr. Johney Frame.   Medication Adjustments/Labs and Tests Ordered: Current medicines are reviewed at length with the patient today.  Concerns regarding medicines are outlined above.  Tests Ordered: Orders Placed This Encounter  Procedures   Comp Met (CMET)   HgB A1c   Lipid panel   CBC   Medication Changes: No orders of the defined types were placed in this  encounter.  Signed, Richardson Dopp, PA-C  09/26/2021 11:27 AM    [facility] [address], [place], [place]  [address] Phone: [phone]; Fax: [phone]

## 2021-09-26 ENCOUNTER — Encounter: Payer: Self-pay | Admitting: Physician Assistant

## 2021-09-26 ENCOUNTER — Ambulatory Visit: Payer: Medicare HMO | Admitting: Physician Assistant

## 2021-09-26 VITALS — BP 112/60 | HR 64 | Ht 67.0 in | Wt 220.4 lb

## 2021-09-26 DIAGNOSIS — E7800 Pure hypercholesterolemia, unspecified: Secondary | ICD-10-CM

## 2021-09-26 DIAGNOSIS — I5032 Chronic diastolic (congestive) heart failure: Secondary | ICD-10-CM | POA: Diagnosis not present

## 2021-09-26 DIAGNOSIS — E119 Type 2 diabetes mellitus without complications: Secondary | ICD-10-CM

## 2021-09-26 DIAGNOSIS — E1165 Type 2 diabetes mellitus with hyperglycemia: Secondary | ICD-10-CM | POA: Insufficient documentation

## 2021-09-26 DIAGNOSIS — D696 Thrombocytopenia, unspecified: Secondary | ICD-10-CM

## 2021-09-26 DIAGNOSIS — I10 Essential (primary) hypertension: Secondary | ICD-10-CM

## 2021-09-26 NOTE — Assessment & Plan Note (Signed)
The patient's blood pressure is controlled on his current regimen.  Continue current therapy with Norvasc 2.5 mg once daily, Carvedilol 12.5 mg twice daily, Lisinopril 40 mg once daily, Spironolactone 25 mg once daily. Obtain CMET prior to next visit.

## 2021-09-26 NOTE — Assessment & Plan Note (Signed)
Diet controlled. He does not have regular follow up with primary care. Will get CMET, Hgb A1c prior to next OV.

## 2021-09-26 NOTE — Assessment & Plan Note (Signed)
No signs of bruising or bleeding. Obtain CBC prior to next OV.

## 2021-09-26 NOTE — Assessment & Plan Note (Signed)
LDL optimal on most recent lab work.  Continue current Rx with Simvastatin 5 mg once daily.

## 2021-09-26 NOTE — Patient Instructions (Signed)
Medication Instructions:  Your physician recommends that you continue on your current medications as directed. Please refer to the Current Medication list given to you today.  *If you need a refill on your cardiac medications before your next appointment, please call your pharmacy*   Lab Work: None ordered today, but please come in 1 week before you see Dr. Shari Prows (in 6 months) For:  CMET, LIPID, CBC, & HGBA1C  If you have labs (blood work) drawn today and your tests are completely normal, you will receive your results only by: MyChart Message (if you have MyChart) OR A paper copy in the mail If you have any lab test that is abnormal or we need to change your treatment, we will call you to review the results.   Testing/Procedures: None ordered   Follow-Up: At [facility], you and your health needs are our priority.  As part of our continuing mission to provide you with exceptional heart care, we have created designated Provider Care Teams.  These Care Teams include your primary Cardiologist (physician) and Advanced Practice Providers (APPs -  Physician Assistants and Nurse Practitioners) who all work together to provide you with the care you need, when you need it.  We recommend signing up for the patient portal called "MyChart".  Sign up information is provided on this After Visit Summary.  MyChart is used to connect with patients for Virtual Visits (Telemedicine).  Patients are able to view lab/test results, encounter notes, upcoming appointments, etc.  Non-urgent messages can be sent to your provider as well.   To learn more about what you can do with MyChart, go to [email or web address].    Your next appointment:   6 month(s)  The format for your next appointment:   In Person  Provider:   Meriam Sprague, MD     Other Instructions   Important Information About Sugar

## 2021-09-26 NOTE — Assessment & Plan Note (Signed)
EF improved from 25-30 to 60-65 by echocardiogram in 2016. He is doing well without signs or symptoms to suggest volume excess. NYHA II. Continue with GDMT: Corege 12.5 mg twice daily, Lasix 20 mg once daily, Lisinopril 40 mg once daily, Spironolactone 25 mg once daily. F/u 6 mos. Labs prior to OV in 6 mos: CMET, fasting Lipids.

## 2021-10-03 ENCOUNTER — Encounter (INDEPENDENT_AMBULATORY_CARE_PROVIDER_SITE_OTHER): Payer: Medicare HMO | Admitting: Ophthalmology

## 2021-10-11 ENCOUNTER — Ambulatory Visit (INDEPENDENT_AMBULATORY_CARE_PROVIDER_SITE_OTHER): Payer: Medicare HMO | Admitting: Ophthalmology

## 2021-10-11 ENCOUNTER — Encounter (INDEPENDENT_AMBULATORY_CARE_PROVIDER_SITE_OTHER): Payer: Self-pay | Admitting: Ophthalmology

## 2021-10-11 DIAGNOSIS — H35352 Cystoid macular degeneration, left eye: Secondary | ICD-10-CM

## 2021-10-11 DIAGNOSIS — H43822 Vitreomacular adhesion, left eye: Secondary | ICD-10-CM

## 2021-10-11 DIAGNOSIS — H43312 Vitreous membranes and strands, left eye: Secondary | ICD-10-CM | POA: Diagnosis not present

## 2021-10-11 NOTE — Assessment & Plan Note (Signed)
Condition resolved 

## 2021-10-11 NOTE — Progress Notes (Signed)
10/11/2021     CHIEF COMPLAINT Patient presents for  Chief Complaint  Patient presents with   Retina Evaluation      HISTORY OF PRESENT ILLNESS: Jesse Torres is a 71 y.o. male who presents to the clinic today for:   HPI   6 mths dilate ou color fp od os  History of dislocated IOL OS, WITH Yamani tunnel IOL fixation.  Confirms use ketorolac twice daily left eye Pt states his vision has been stable Pt denies any new floaters or FOL  Last edited by Edmon Crape, MD on 10/11/2021  9:49 AM.      Referring physician: Dessa Phi, MD No address on file  HISTORICAL INFORMATION:   Selected notes from the MEDICAL RECORD NUMBER    Lab Results  Component Value Date   HGBA1C 6.5 (H) 01/27/2020     CURRENT MEDICATIONS: Current Outpatient Medications (Ophthalmic Drugs)  Medication Sig   ketorolac (ACULAR) 0.5 % ophthalmic solution INSTILL 1 DROP INTO LEFT EYE TWICE DAILY   No current facility-administered medications for this visit. (Ophthalmic Drugs)   Current Outpatient Medications (Other)  Medication Sig   acetaminophen (TYLENOL) 500 MG tablet Take 1 tablet (500 mg total) by mouth every 6 (six) hours as needed.   amLODipine (NORVASC) 2.5 MG tablet TAKE 1 TABLET (2.5 MG TOTAL) BY MOUTH DAILY.   carvedilol (COREG) 12.5 MG tablet Take 1 tablet (12.5 mg total) by mouth 2 (two) times daily with a meal.   furosemide (LASIX) 20 MG tablet Take 1 tablet (20 mg total) by mouth daily. Please call [phone] to schedule an appointment for future refills. Thank you.   glucose blood (ACCU-CHEK GUIDE) test strip Use as instructed twice per day and as directed   lisinopril (ZESTRIL) 40 MG tablet Take 1 tablet (40 mg total) by mouth 2 (two) times daily.   simvastatin (ZOCOR) 5 MG tablet Take 1 tablet (5 mg total) by mouth daily. Please call [phone] to schedule an appointment for future refills. Thank you.   spironolactone (ALDACTONE) 25 MG tablet Take 1 tablet (25 mg  total) by mouth daily.   No current facility-administered medications for this visit. (Other)      REVIEW OF SYSTEMS: ROS   Negative for: Constitutional, Gastrointestinal, Neurological, Skin, Genitourinary, Musculoskeletal, HENT, Endocrine, Cardiovascular, Eyes, Respiratory, Psychiatric, Allergic/Imm, Heme/Lymph Last edited by Aleene Davidson, CMA on 10/11/2021  8:56 AM.       ALLERGIES No Known Allergies  PAST MEDICAL HISTORY Past Medical History:  Diagnosis Date   Cardiomyopathy (HCC)    probable HTN cardiomyopathy;  Lexiscan Myoview (02/07/2013): EF 34%, old small apical infarct, no ischemia, moderate apical hypokinesis.   Chronic systolic CHF (congestive heart failure) (HCC)    a. Echocardiogram (02/05/2013): Mild LVH, EF 25-30%, diffuse HK, mild MR, mild LAE, mild RVE, moderately reduced RVSF, mild RAE, PASP 64, trivial effusion.;  b.  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   Vitrectomy 02-18-2020   Vitreous membranes and strands, left 02/19/2020   Contributing to pseudophakic CME in a scleral fixated posterior chamber lens  Vitrectomy 02-18-20, removal of cortical vitreous   Past Surgical History:  Procedure Laterality Date   Cataract surgery      FAMILY HISTORY Family History  Problem Relation Age of Onset   Heart attack Mother    Stroke Father     SOCIAL HISTORY Social History   Tobacco Use   Smoking status: Former    Types: Cigarettes    Quit date: 02/06/1995    Years since quitting: 26.6   Smokeless tobacco: Never  Vaping Use   Vaping Use: Unknown  Substance Use Topics   Alcohol use: No   Drug use: No         OPHTHALMIC  EXAM:  Base Eye Exam     Visual Acuity (ETDRS)       Right Left   Dist [place] 20/20 20/30         Tonometry (Tonopen, 9:00 AM)       Right Left   Pressure 19 14         Pupils       Pupils APD   Right PERRL None   Left PERRL None         Visual Fields       Left Right    Full Full         Extraocular Movement       Right Left    Full, Ortho Full, Ortho         Neuro/Psych     Oriented x3: Yes   Mood/Affect: Normal         Dilation     Both eyes: 1.0% Mydriacyl, 2.5% Phenylephrine @ 8:58 AM           Slit Lamp and Fundus Exam     External Exam       Right Left   External Normal Normal         Slit Lamp Exam       Right Left   Lids/Lashes Normal Normal   Conjunctiva/Sclera White and quiet good coverage, no exposed haptics, visible haptic's at 11 and 5, subconjunctival   Cornea Clear Clear   Anterior Chamber Deep and quiet Deep and quiet, no cells no pigment   Iris Round and reactive Inferior PI stable, no visible chafe   Lens Clear Centered posterior chamber intraocular lens   Anterior Vitreous Normal Normal, clear vitrectomized, no pigment         Fundus Exam       Right Left   Posterior Vitreous Normal Normal clear vitrectomized, no pigment   Disc Normal Normal   C/D Ratio 0.35 0.35   Macula Normal, , no macular thickening Normal, , no macular thickening   Vessels Normal Normal   Periphery Normal, no holes or tears Normal            IMAGING AND PROCEDURES  Imaging and Procedures for 10/11/21  Color Fundus Photography Optos - OU - Both Eyes       Right Eye Progression has been stable. Disc findings include normal observations. Macula : normal observations. Vessels : normal observations. Periphery : normal observations.   Left Eye Progression has been stable. Vessels : normal observations. Periphery : normal observations.   Notes CME resolved OS              ASSESSMENT/PLAN:  Cystoid macular edema  of left eye OS doing very well post vitrectomy as well as surgical placement of three-piece IOL posterior chamber via Yamane scleral tunnel.  Chronic CME in the past resolved and also controlled on chronic twice daily use of ketorolac  Patient reports excellent compliance  Vitreomacular adhesion of left eye Condition resolved  Vitreous membranes and strands, left Condition resolved     ICD-10-CM   1. Vitreomacular adhesion of left eye  H43.822 Color Fundus Photography Optos - OU - Both Eyes    2. Cystoid macular edema of left eye  H35.352     3. Vitreous membranes and strands, left  H43.312       1.  OS doing well agree with the pseudophakic condition with [facility] scleral tunnel fixation of three-piece IOL.  Excellent acuity.  CME recurrent in the past has been treated successfully and now controlled on chronic use of ketorolac twice daily.  2.  3.  Ophthalmic Meds Ordered this visit:  No orders of the defined types were placed in this encounter.      Return in about 9 months (around 07/12/2022) for DILATE OU, OCT.  There are no Patient Instructions on file for this visit.   Explained the diagnoses, plan, and follow up with the patient and they expressed understanding.  Patient expressed understanding of the importance of proper follow up care.   Alford Highland Voshon Petro M.D. Diseases & Surgery of the Retina and Vitreous [facility] 10/11/21     Abbreviations: M myopia (nearsighted); A astigmatism; H hyperopia (farsighted); P presbyopia; Mrx spectacle prescription;  CTL contact lenses; OD right eye; OS left eye; OU both eyes  XT exotropia; ET esotropia; PEK punctate epithelial keratitis; PEE punctate epithelial erosions; DES dry eye syndrome; MGD meibomian gland dysfunction; ATs artificial tears; PFAT's preservative free artificial tears; NSC nuclear sclerotic cataract; PSC posterior subcapsular cataract; ERM epi-retinal membrane; PVD posterior vitreous  detachment; RD retinal detachment; DM diabetes mellitus; DR diabetic retinopathy; NPDR non-proliferative diabetic retinopathy; PDR proliferative diabetic retinopathy; CSME clinically significant macular edema; DME diabetic macular edema; dbh dot blot hemorrhages; CWS cotton wool spot; POAG primary open angle glaucoma; C/D cup-to-disc ratio; HVF humphrey visual field; GVF goldmann visual field; OCT optical coherence tomography; IOP intraocular pressure; BRVO Branch retinal vein occlusion; CRVO central retinal vein occlusion; CRAO central retinal artery occlusion; BRAO branch retinal artery occlusion; RT retinal tear; SB scleral buckle; PPV pars plana vitrectomy; VH Vitreous hemorrhage; PRP panretinal laser photocoagulation; IVK intravitreal kenalog; VMT vitreomacular traction; MH Macular hole;  NVD neovascularization of the disc; NVE neovascularization elsewhere; AREDS age related eye disease study; ARMD age related macular degeneration; POAG primary open angle glaucoma; EBMD epithelial/anterior basement membrane dystrophy; ACIOL anterior chamber intraocular lens; IOL intraocular lens; PCIOL posterior chamber intraocular lens; Phaco/IOL phacoemulsification with intraocular lens placement; PRK photorefractive keratectomy; LASIK laser assisted in situ keratomileusis; HTN hypertension; DM diabetes mellitus; COPD chronic obstructive pulmonary disease

## 2021-10-11 NOTE — Assessment & Plan Note (Signed)
OS doing very well post vitrectomy as well as surgical placement of three-piece IOL posterior chamber via Yamane scleral tunnel.  Chronic CME in the past resolved and also controlled on chronic twice daily use of ketorolac  Patient reports excellent compliance

## 2021-11-23 ENCOUNTER — Other Ambulatory Visit: Payer: Self-pay

## 2021-11-23 DIAGNOSIS — I420 Dilated cardiomyopathy: Secondary | ICD-10-CM

## 2021-11-23 DIAGNOSIS — I10 Essential (primary) hypertension: Secondary | ICD-10-CM

## 2021-11-23 DIAGNOSIS — R799 Abnormal finding of blood chemistry, unspecified: Secondary | ICD-10-CM

## 2021-11-23 DIAGNOSIS — I5022 Chronic systolic (congestive) heart failure: Secondary | ICD-10-CM

## 2021-11-23 DIAGNOSIS — R7989 Other specified abnormal findings of blood chemistry: Secondary | ICD-10-CM

## 2021-11-23 DIAGNOSIS — E7800 Pure hypercholesterolemia, unspecified: Secondary | ICD-10-CM

## 2021-11-23 MED ORDER — SIMVASTATIN 5 MG PO TABS: 5.0000 mg | ORAL_TABLET | Freq: Every day | ORAL | 3 refills | Status: DC

## 2021-11-23 MED ORDER — FUROSEMIDE 20 MG PO TABS: 20.0000 mg | ORAL_TABLET | Freq: Every day | ORAL | 3 refills | Status: DC

## 2021-11-23 NOTE — Addendum Note (Signed)
Addended by: Carter Kitten D on: 11/23/2021 02:51 PM   Modules accepted: Orders

## 2022-03-20 NOTE — Progress Notes (Deleted)
Cardiology Office Note:    Date:  03/20/2022   ID:  Jesse Torres, DOB 05-13-1950, MRN [id number]  PCP:  Boykin Nearing, MD   [place]  Cardiologist:  Freada Bergeron, MD  Advanced Practice Provider:  No care team member to display Electrophysiologist:  None    Referring MD: Boykin Nearing, MD    History of Present Illness:    Jesse Torres is a 72 y.o. 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 Initially his ejection fraction was noted at 35%. There was no ischemia by past nuclear scan. Cardiac catheterization was never done. He is been very compliant with his medications and has had normalization of left ventricular function - last echo from March of 2016.    Last seen in clinic on 09/26/21 by Richardson Dopp where he was doing well without significant CV symptoms.   Today, ***   Past Medical History:  Diagnosis Date   Cardiomyopathy ([place])    probable HTN cardiomyopathy;  Lexiscan Myoview (02/07/2013): EF 34%, old small apical infarct, no ischemia, moderate apical hypokinesis.   Chronic systolic CHF (congestive heart failure) (HCC)    a. Echocardiogram (02/05/2013): Mild LVH, EF 25-30%, diffuse HK, mild MR, mild LAE, mild RVE, moderately reduced RVSF, mild RAE, PASP 64, trivial effusion.;  b.  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     Allergies:   Patient has no known allergies.   Social History   Socioeconomic History   Marital status: Married    Spouse name: Not on file   Number of children: Not on file   Years of education: Not on file   Highest education level: Not on file  Occupational History   Not on file  Tobacco Use   Smoking status: Former    Types: Cigarettes    Quit date: 02/06/1995    Years since quitting: 27.1   Smokeless tobacco: Never  Vaping Use   Vaping Use: Unknown  Substance and Sexual Activity   Alcohol use: No   Drug use: No   Sexual activity: Not on file  Other Topics Concern   Not on file  Social History Narrative   Not on file   Social Determinants of Health   Financial Resource Strain: Not on file  Food Insecurity: Not on file  Transportation Needs: Not on file  Physical Activity: Not on file  Stress: Not on file  Social Connections: Not on file     Family History: The  patient's family history includes Heart attack in his mother; Stroke in his father.  ROS:   Please see the history of present illness.    Review of Systems  Constitutional:  Negative for chills and fever.  HENT:  Negative for sore throat.   Eyes:  Negative for blurred vision and redness.  Respiratory:  Negative for shortness of breath.   Cardiovascular:  Positive for leg swelling. Negative for chest pain, palpitations, orthopnea, claudication and PND.  Gastrointestinal:  Negative for blood in stool, melena, nausea and vomiting.  Genitourinary:  Negative for hematuria.  Musculoskeletal:  Positive for joint pain.  Neurological:  Negative for  dizziness and loss of consciousness.  Endo/Heme/Allergies:  Negative for polydipsia.  Psychiatric/Behavioral:  Negative for substance abuse.     EKGs/Labs/Other Studies Reviewed:    The following studies were reviewed today:  TTE 2016: - Left ventricle: The cavity size was normal. Wall thickness was    normal. Systolic function was normal. The estimated ejection    fraction was in the range of 60% to 65%. Wall motion was normal;    there were no regional wall motion abnormalities.  - Left atrium: The atrium was mildly dilated.  - Pulmonary arteries: Systolic pressure was mildly increased. PA    peak pressure: 33 mm Hg (S).   Nuclear Stress 02/07/2013: IMPRESSION:  1. 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 There is no significant reversibility.   2. Systolic LV dysfunction with EF 34% with moderate apical  hypokinesis.   EKG:  EKG is personally reviewed. 03/16/2021: Sinus rhythm. RBBB.  Recent Labs: No results found for requested labs within last 365 days.   Recent Lipid Panel    Component Value Date/Time   CHOL 97 (L) 03/24/2021 0948   TRIG 104 03/24/2021 0948   HDL 29 (L) 03/24/2021 0948   CHOLHDL 3.3 03/24/2021 0948   CHOLHDL 3.8 01/26/2016 0820   VLDL 24 01/26/2016 0820   LDLCALC 48 03/24/2021 0948     Physical Exam:    VS:  There were no vitals taken for this visit.    Wt Readings from Last 3 Encounters:  09/26/21 220 lb 6.4 oz (100 kg)  03/16/21 223 lb 12.8 oz (101.5 kg)  09/15/20 222 lb 9.6 oz (101 kg)     GEN:  Well nourished, well developed in no acute distress HEENT: Normal NECK: No JVD; No carotid bruits CARDIAC: Bradycardic, regular, no murmurs, rubs, gallops RESPIRATORY:  Clear to auscultation without rales, wheezing or rhonchi  ABDOMEN: Soft, non-tender, non-distended MUSCULOSKELETAL:  No edema; No deformity  SKIN: Warm and dry NEUROLOGIC:  Alert and oriented x 3 PSYCHIATRIC:  Normal affect    ASSESSMENT:    No diagnosis found.   PLAN:    In order of problems listed above:  #NICM/hypertensive CM/chronic systolic HF with recovered EF:  Presumed hypertension induced CM with initial EF 35% now recovered back to 60-65% on TTE in 2016. Doing well on GDMT. NYHA class I/II symptoms. Euvolemic on exam today -Continue coreg 12.5mg  BID -Continue lisinopril 40mg  daily -Continue spiro 25mg  daily -Continue lasix 20mg  daily -Monitor weights -Low Na diet   #HTN: Very well controlled and at goal <120/80s. -Continue coreg 12.5mg  BID -Continue lisinopril 40mg  daily -Continue spironolactone 25mg  daily -Continue amlodipine 2.5mg  daily   #HLD:  LDL 48 in 03/2021.  -Continue simva 20mg  daily -Repeat lipids   #Thrombocytopenia: Last plt count 94 omn 01/26/21. -Declined referral to heme -No ASA/AC   #CKD: -  Monitor   #DM:  Trying to manage with diet and is wanting to work on his weight.  -Follow-up with PCP as scheduled   #History of bradycardia  Asymptomatic -Continue coreg   Follow-up:  6 months   Medication Adjustments/Labs and Tests Ordered: Current medicines are reviewed at length with the patient today.  Concerns regarding medicines are outlined above.   No orders of the defined types were placed in this encounter.  No orders of the defined types were placed in this encounter.  There are no Patient Instructions on file for this visit.     Signed, Freada Bergeron, MD  03/20/2022 12:34 PM    [place]

## 2022-03-24 ENCOUNTER — Other Ambulatory Visit: Payer: Medicare HMO

## 2022-03-27 ENCOUNTER — Other Ambulatory Visit: Payer: Self-pay

## 2022-03-27 ENCOUNTER — Ambulatory Visit: Payer: Medicare HMO | Admitting: Cardiology

## 2022-03-27 DIAGNOSIS — N1831 Chronic kidney disease, stage 3a: Secondary | ICD-10-CM

## 2022-03-27 DIAGNOSIS — Z79899 Other long term (current) drug therapy: Secondary | ICD-10-CM

## 2022-03-27 DIAGNOSIS — I10 Essential (primary) hypertension: Secondary | ICD-10-CM

## 2022-03-27 MED ORDER — AMLODIPINE BESYLATE 2.5 MG PO TABS: 2.5000 mg | ORAL_TABLET | Freq: Every day | ORAL | 1 refills | Status: DC

## 2022-04-06 ENCOUNTER — Other Ambulatory Visit: Payer: Self-pay

## 2022-04-06 DIAGNOSIS — Z79899 Other long term (current) drug therapy: Secondary | ICD-10-CM

## 2022-04-06 DIAGNOSIS — N1831 Chronic kidney disease, stage 3a: Secondary | ICD-10-CM

## 2022-04-06 DIAGNOSIS — I10 Essential (primary) hypertension: Secondary | ICD-10-CM

## 2022-04-06 MED ORDER — SPIRONOLACTONE 25 MG PO TABS: 25.0000 mg | ORAL_TABLET | Freq: Every day | ORAL | 2 refills | Status: DC

## 2022-04-06 MED ORDER — LISINOPRIL 40 MG PO TABS: 40.0000 mg | ORAL_TABLET | Freq: Two times a day (BID) | ORAL | 2 refills | Status: DC

## 2022-04-06 MED ORDER — CARVEDILOL 12.5 MG PO TABS: 12.5000 mg | ORAL_TABLET | Freq: Two times a day (BID) | ORAL | 2 refills | Status: DC

## 2022-04-06 NOTE — Addendum Note (Signed)
Addended by: Carter Kitten D on: 04/06/2022 03:38 PM   Modules accepted: Orders

## 2022-07-12 ENCOUNTER — Encounter (INDEPENDENT_AMBULATORY_CARE_PROVIDER_SITE_OTHER): Payer: Medicare HMO | Admitting: Ophthalmology

## 2022-07-12 DIAGNOSIS — H43821 Vitreomacular adhesion, right eye: Secondary | ICD-10-CM | POA: Diagnosis not present

## 2022-07-12 DIAGNOSIS — H43312 Vitreous membranes and strands, left eye: Secondary | ICD-10-CM | POA: Diagnosis not present

## 2022-07-12 DIAGNOSIS — H35352 Cystoid macular degeneration, left eye: Secondary | ICD-10-CM | POA: Diagnosis not present

## 2022-07-12 DIAGNOSIS — H35372 Puckering of macula, left eye: Secondary | ICD-10-CM | POA: Diagnosis not present

## 2022-08-09 NOTE — Progress Notes (Unsigned)
Cardiology Office Note:    Date:  08/14/2022   ID:  Jesse Torres, DOB [birth year], MRN [id number]  PCP:  Dessa Phi, MD   [facility]  Cardiologist:  Meriam Sprague, MD  Advanced Practice Provider:  No care team member to display Electrophysiologist:  None    Referring MD: Dessa Phi, MD    History of Present Illness:    Jesse Torres is a 72 y.o. 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 Initially his ejection fraction was noted at 35%. There was no ischemia by past nuclear scan. Cardiac catheterization was never done. He is been very compliant with his medications and has had normalization of left ventricular function - last echo from March of 2016.    Last seen in clinic on 09/2021 by Tereso Newcomer where he was doing well from a CV standpoint.  Today, the patient overall feels well. No chest pain, SOB, orthopnea, PND or palpitations. Has some LE edema by end of the day but resolves by the morning. He remains very active without exertional symptoms. No lightheadedness, dizziness or syncope. Tolerating medications as prescribed.    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   Chronic systolic CHF (congestive heart failure) (HCC)    a. Echocardiogram (02/05/2013): Mild LVH, EF 25-30%, diffuse HK, mild MR, mild LAE, mild RVE, moderately reduced RVSF, mild RAE, PASP 64, trivial effusion.;  b.  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   glucose blood (ACCU-CHEK GUIDE) test strip Use as instructed twice per day and as directed   lisinopril (ZESTRIL) 40 MG tablet Take 1 tablet (40 mg total) by mouth daily.   [DISCONTINUED] amLODipine (NORVASC) 2.5 MG tablet Take 1 tablet (2.5 mg total) by mouth daily.   [DISCONTINUED] carvedilol (COREG) 12.5 MG tablet Take 1 tablet (12.5 mg total) by mouth 2 (two) times daily with a meal.   [DISCONTINUED] furosemide (LASIX) 20 MG tablet Take 1 tablet (20 mg total) by mouth daily.   [DISCONTINUED] ketorolac (ACULAR) 0.5 % ophthalmic solution INSTILL 1 DROP INTO LEFT EYE TWICE DAILY   [DISCONTINUED] lisinopril (ZESTRIL) 40 MG tablet Take 1 tablet (40 mg total) by mouth 2 (two) times daily.   [DISCONTINUED] simvastatin (ZOCOR) 5 MG tablet Take 1 tablet (5 mg total) by mouth daily.   [  DISCONTINUED] spironolactone (ALDACTONE) 25 MG tablet Take 1 tablet (25 mg total) by mouth daily.     Allergies:   Patient has no known allergies.   Social History   Socioeconomic History   Marital status: Married    Spouse name: Not on file   Number of children: Not on file   Years of education: Not on file   Highest education level: Not on file  Occupational History   Not on file  Tobacco Use   Smoking status: Former    Types: Cigarettes    Quit date: 02/06/1995    Years since  quitting: 27.5   Smokeless tobacco: Never  Vaping Use   Vaping Use: Unknown  Substance and Sexual Activity   Alcohol use: No   Drug use: No   Sexual activity: Not on file  Other Topics Concern   Not on file  Social History Narrative   Not on file   Social Determinants of Health   Financial Resource Strain: Not on file  Food Insecurity: Not on file  Transportation Needs: Not on file  Physical Activity: Not on file  Stress: Not on file  Social Connections: Not on file     Family History: The patient's family history includes Heart attack in his mother; Stroke in his father.  ROS:   Please see the history of present illness.      EKGs/Labs/Other Studies Reviewed:    The following studies were reviewed today:  TTE 2016: - Left ventricle: The cavity size was normal. Wall thickness was    normal. Systolic function was normal. The estimated ejection    fraction was in the range of 60% to 65%. Wall motion was normal;    there were no regional wall motion abnormalities.  - Left atrium: The atrium was mildly dilated.  - Pulmonary arteries: Systolic pressure was mildly increased. PA    peak pressure: 33 mm Hg (S).   Nuclear Stress 02/07/2013: IMPRESSION:  1. 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 There is no significant reversibility.   2. Systolic LV dysfunction with EF 34% with moderate apical  hypokinesis.   EKG:  EKG is personally reviewed. NSR, RBBB, inferior q, PVCs  Recent Labs: No results found for requested labs within last 365 days.   Recent Lipid Panel    Component Value Date/Time   CHOL 97 (L) 03/24/2021 0948   TRIG 104 03/24/2021 0948   HDL 29 (L) 03/24/2021 0948   CHOLHDL 3.3 03/24/2021 0948   CHOLHDL 3.8 01/26/2016 0820   VLDL 24 01/26/2016 0820   LDLCALC 48 03/24/2021 0948     Physical Exam:    VS:  BP 100/60   Pulse 64   Ht 5\' 7"  (1.702 m)   Wt 210 lb 3.2 oz (95.3 kg)   SpO2 99%   BMI 32.92 kg/m      Wt Readings from Last 3 Encounters:  08/14/22 210 lb 3.2 oz (95.3 kg)  09/26/21 220 lb 6.4 oz (100 kg)  03/16/21 223 lb 12.8 oz (101.5 kg)     GEN:  Well nourished, well developed in no acute distress HEENT: Normal NECK: No JVD; No carotid bruits CARDIAC: Bradycardic, regular, no murmurs, rubs, gallops RESPIRATORY:  Clear to auscultation without rales, wheezing or rhonchi  ABDOMEN: Soft, non-tender, non-distended MUSCULOSKELETAL:  No edema; No deformity  SKIN: Warm and dry NEUROLOGIC:  Alert and oriented x 3 PSYCHIATRIC:  Normal affect   ASSESSMENT:    1. Essential  hypertension   2. Pure hypercholesterolemia   3. Dilated cardiomyopathy (HCC)   4. Chronic systolic CHF (congestive heart failure) (HCC)   5. Medication management   6. Stage 3a chronic kidney disease (HCC)   7. Elevated serum creatinine   8. Type 2 diabetes mellitus without complication, without long-term current use of insulin (HCC)   9. Thrombocytopenia (HCC)   10. Coronary artery disease involving native heart without angina pectoris, unspecified vessel or lesion type   11. Diabetes mellitus with coincident hypertension (HCC)     PLAN:    In order of problems listed above:  #NICM/hypertensive CM/chronic systolic HF with recovered EF:  Presumed hypertension induced CM with initial EF 35% now recovered back to 60-65% on TTE in 2016. Doing well on GDMT. NYHA class I/II symptoms. Euvolemic on exam today. -Continue coreg 12.5mg  BID -Continue lisinopril 40mg  daily (looks like he is taking it twice per day>>told him it should be daily) -Continue spiro 25mg  daily -Continue lasix 20mg  daily -Monitor weights -Low Na diet   #HTN: Very well controlled and at goal <120/80s. -Continue coreg 12.5mg  BID -Continue lisinopril 40mg  daily (looks like he is taking it twice per day>>told him it should be daily) -Continue spironolactone 25mg  daily -Continue amlodipine 2.5mg  daily   #HLD:  Has not had lipid panel  since 2021. Previously well controlled with LDL 65 -Continue simva 20mg  daily -Repeat lipids today  #Thrombocytopenia: Last plt count 94 omn 01/26/21; will repeat today -Declined referral to heme -No ASA/AC -Repeat CBC today   #CKD: Stable at 1.1-1.2. -Check BMET today   #DM:  Trying to manage with diet and is wanting to work on his weight.  -Follow-up with PCP as scheduled -Check A1C today   #History of bradycardia  Asymptomatic -Continue coreg   #PVCs on ECG today: -No significant palpitations or ischemic symptoms -Wishes to defer zio unless TTE abnormal -Repeat TTE -Continue coreg 12.5mg  BID  Follow-up:  6 months with Dr. Kirke Corin   Medication Adjustments/Labs and Tests Ordered: Current medicines are reviewed at length with the patient today.  Concerns regarding medicines are outlined above.   Orders Placed This Encounter  Procedures   Comprehensive metabolic panel   CBC   Hemoglobin A1c   Lipid panel   EKG 12-Lead   ECHOCARDIOGRAM COMPLETE   Meds ordered this encounter  Medications   simvastatin (ZOCOR) 5 MG tablet    Sig: Take 1 tablet (5 mg total) by mouth daily.    Dispense:  90 tablet    Refill:  3   spironolactone (ALDACTONE) 25 MG tablet    Sig: Take 1 tablet (25 mg total) by mouth daily.    Dispense:  90 tablet    Refill:  2    Dose increase   furosemide (LASIX) 20 MG tablet    Sig: Take 1 tablet (20 mg total) by mouth daily.    Dispense:  90 tablet    Refill:  3   carvedilol (COREG) 12.5 MG tablet    Sig: Take 1 tablet (12.5 mg total) by mouth 2 (two) times daily with a meal.    Dispense:  180 tablet    Refill:  2   amLODipine (NORVASC) 2.5 MG tablet    Sig: Take 1 tablet (2.5 mg total) by mouth daily.    Dispense:  90 tablet    Refill:  1    PT IS DUE TO SEE DR. Shari Prows IN AUGUST, PLEASE HAVE PT CALL TO SCHEDULE   lisinopril (ZESTRIL)  40 MG tablet    Sig: Take 1 tablet (40 mg total) by mouth daily.    Dispense:  90 tablet    Refill:   3   Patient Instructions  Medication Instructions:  Your physician has recommended you make the following change in your medication:   Decrease Lisinopril to 40 mg 1 time daily  *If you need a refill on your cardiac medications before your next appointment, please call your pharmacy*   Lab Work: Lipids, CMET, CBC, HAIC If you have labs (blood work) drawn today and your tests are completely normal, you will receive your results only by: MyChart Message (if you have MyChart) OR A paper copy in the mail If you have any lab test that is abnormal or we need to change your treatment, we will call you to review the results.   Testing/Procedures: ECHO Your physician has requested that you have an echocardiogram. Echocardiography is a painless test that uses sound waves to create images of your heart. It provides your doctor with information about the size and shape of your heart and how well your heart's chambers and valves are working. This procedure takes approximately one hour. There are no restrictions for this procedure. Please do NOT wear cologne, perfume, aftershave, or lotions (deodorant is allowed). Please arrive 15 minutes prior to your appointment time.    Follow-Up: At [facility], you and your health needs are our priority.  As part of our continuing mission to provide you with exceptional heart care, we have created designated Provider Care Teams.  These Care Teams include your primary Cardiologist (physician) and Advanced Practice Providers (APPs -  Physician Assistants and Nurse Practitioners) who all work together to provide you with the care you need, when you need it.  We recommend signing up for the patient portal called "MyChart".  Sign up information is provided on this After Visit Summary.  MyChart is used to connect with patients for Virtual Visits (Telemedicine).  Patients are able to view lab/test results, encounter notes, upcoming appointments, etc.   Non-urgent messages can be sent to your provider as well.   To learn more about what you can do with MyChart, go to [email or web address].    Your next appointment:   6 month(s)  Provider:   Lorine Bears, MD         Signed, Meriam Sprague, MD  08/14/2022 9:52 AM    [facility]

## 2022-08-14 ENCOUNTER — Ambulatory Visit: Payer: Medicare HMO | Attending: Cardiology | Admitting: Cardiology

## 2022-08-14 ENCOUNTER — Encounter: Payer: Self-pay | Admitting: Cardiology

## 2022-08-14 VITALS — BP 100/60 | HR 64 | Ht 67.0 in | Wt 210.2 lb

## 2022-08-14 DIAGNOSIS — E119 Type 2 diabetes mellitus without complications: Secondary | ICD-10-CM

## 2022-08-14 DIAGNOSIS — I5022 Chronic systolic (congestive) heart failure: Secondary | ICD-10-CM

## 2022-08-14 DIAGNOSIS — N1831 Chronic kidney disease, stage 3a: Secondary | ICD-10-CM

## 2022-08-14 DIAGNOSIS — Z79899 Other long term (current) drug therapy: Secondary | ICD-10-CM | POA: Diagnosis not present

## 2022-08-14 DIAGNOSIS — E7800 Pure hypercholesterolemia, unspecified: Secondary | ICD-10-CM

## 2022-08-14 DIAGNOSIS — I420 Dilated cardiomyopathy: Secondary | ICD-10-CM

## 2022-08-14 DIAGNOSIS — I10 Essential (primary) hypertension: Secondary | ICD-10-CM

## 2022-08-14 DIAGNOSIS — R7989 Other specified abnormal findings of blood chemistry: Secondary | ICD-10-CM | POA: Diagnosis not present

## 2022-08-14 DIAGNOSIS — I2510 Atherosclerotic heart disease of native coronary artery without angina pectoris: Secondary | ICD-10-CM

## 2022-08-14 DIAGNOSIS — D696 Thrombocytopenia, unspecified: Secondary | ICD-10-CM

## 2022-08-14 LAB — LIPID PANEL
Chol/HDL Ratio: 3.5 ratio (ref 0.0–5.0)
LDL Chol Calc (NIH): 57 mg/dL (ref 0–99)

## 2022-08-14 LAB — CBC

## 2022-08-14 MED ORDER — FUROSEMIDE 20 MG PO TABS: 20.0000 mg | ORAL_TABLET | Freq: Every day | ORAL | 3 refills | Status: DC

## 2022-08-14 MED ORDER — SIMVASTATIN 5 MG PO TABS: 5.0000 mg | ORAL_TABLET | Freq: Every day | ORAL | 3 refills | Status: DC

## 2022-08-14 MED ORDER — LISINOPRIL 40 MG PO TABS: 40.0000 mg | ORAL_TABLET | Freq: Every day | ORAL | 3 refills | Status: DC

## 2022-08-14 MED ORDER — AMLODIPINE BESYLATE 2.5 MG PO TABS: 2.5000 mg | ORAL_TABLET | Freq: Every day | ORAL | 1 refills | Status: DC

## 2022-08-14 MED ORDER — CARVEDILOL 12.5 MG PO TABS: 12.5000 mg | ORAL_TABLET | Freq: Two times a day (BID) | ORAL | 2 refills | Status: DC

## 2022-08-14 MED ORDER — SPIRONOLACTONE 25 MG PO TABS: 25.0000 mg | ORAL_TABLET | Freq: Every day | ORAL | 2 refills | Status: DC

## 2022-08-14 NOTE — Patient Instructions (Signed)
Medication Instructions:  Your physician has recommended you make the following change in your medication:   Decrease Lisinopril to 40 mg 1 time daily  *If you need a refill on your cardiac medications before your next appointment, please call your pharmacy*   Lab Work: Lipids, CMET, CBC, HAIC If you have labs (blood work) drawn today and your tests are completely normal, you will receive your results only by: MyChart Message (if you have MyChart) OR A paper copy in the mail If you have any lab test that is abnormal or we need to change your treatment, we will call you to review the results.   Testing/Procedures: ECHO Your physician has requested that you have an echocardiogram. Echocardiography is a painless test that uses sound waves to create images of your heart. It provides your doctor with information about the size and shape of your heart and how well your heart's chambers and valves are working. This procedure takes approximately one hour. There are no restrictions for this procedure. Please do NOT wear cologne, perfume, aftershave, or lotions (deodorant is allowed). Please arrive 15 minutes prior to your appointment time.    Follow-Up: At [facility], you and your health needs are our priority.  As part of our continuing mission to provide you with exceptional heart care, we have created designated Provider Care Teams.  These Care Teams include your primary Cardiologist (physician) and Advanced Practice Providers (APPs -  Physician Assistants and Nurse Practitioners) who all work together to provide you with the care you need, when you need it.  We recommend signing up for the patient portal called "MyChart".  Sign up information is provided on this After Visit Summary.  MyChart is used to connect with patients for Virtual Visits (Telemedicine).  Patients are able to view lab/test results, encounter notes, upcoming appointments, etc.  Non-urgent messages can be sent to  your provider as well.   To learn more about what you can do with MyChart, go to [email or web address].    Your next appointment:   6 month(s)  Provider:   Lorine Bears, MD

## 2022-08-15 ENCOUNTER — Telehealth: Payer: Self-pay | Admitting: Cardiology

## 2022-08-15 LAB — CBC
Hemoglobin: 14 g/dL (ref 13.0–17.7)
MCH: 28.1 pg (ref 26.6–33.0)
MCHC: 33.6 g/dL (ref 31.5–35.7)
MCV: 84 fL (ref 79–97)
RBC: 4.99 x10E6/uL (ref 4.14–5.80)
WBC: 5.7 10*3/uL (ref 3.4–10.8)

## 2022-08-15 LAB — LIPID PANEL
Cholesterol, Total: 112 mg/dL (ref 100–199)
HDL: 32 mg/dL — ABNORMAL LOW (ref >39)
Triglycerides: 125 mg/dL (ref 0–149)
VLDL Cholesterol Cal: 23 mg/dL (ref 5–40)

## 2022-08-15 LAB — COMPREHENSIVE METABOLIC PANEL
ALT: 9 IU/L (ref 0–44)
AST: 11 IU/L (ref 0–40)
Albumin: 4.2 g/dL (ref 3.8–4.8)
Alkaline Phosphatase: 83 IU/L (ref 44–121)
BUN/Creatinine Ratio: 23 (ref 10–24)
BUN: 28 mg/dL — ABNORMAL HIGH (ref 8–27)
Bilirubin Total: 1 mg/dL (ref 0.0–1.2)
CO2: 23 mmol/L (ref 20–29)
Calcium: 9.5 mg/dL (ref 8.6–10.2)
Chloride: 99 mmol/L (ref 96–106)
Creatinine, Ser: 1.23 mg/dL (ref 0.76–1.27)
Globulin, Total: 2.5 g/dL (ref 1.5–4.5)
Glucose: 261 mg/dL — ABNORMAL HIGH (ref 70–99)
Potassium: 4.2 mmol/L (ref 3.5–5.2)
Sodium: 135 mmol/L (ref 134–144)
Total Protein: 6.7 g/dL (ref 6.0–8.5)
eGFR: 63 mL/min/{1.73_m2} (ref >59)

## 2022-08-15 LAB — HEMOGLOBIN A1C
Est. average glucose Bld gHb Est-mCnc: 194 mg/dL
Hgb A1c MFr Bld: 8.4 % — ABNORMAL HIGH (ref 4.8–5.6)

## 2022-08-15 MED ORDER — METFORMIN HCL 500 MG PO TABS: 500.0000 mg | ORAL_TABLET | Freq: Two times a day (BID) | ORAL | 1 refills | Status: DC

## 2022-08-15 NOTE — Telephone Encounter (Signed)
Spoke with patient and discussed lab results.  Per Dr. Shari Prows: Kidney function and electrolytes look good. Blood counts are stable. Cholesterol is well controlled.    A1C is 8.5 which is significantly up from 6.5 previously. He is now solidly within the diabetic range. Does he have a primary who can help get him on the appropriate medications? We can start metformin 500mg  BID for now and if he does not have a primary, would recommend Hillard Danker at [place].  Patient states he does not currently have a PCP, he will call to schedule appt to see Dr. Okey Dupre as recommended by Dr. Shari Prows.  Patient agreeable to starting metformin 500mg  BID, sent to [facility] per patient request.

## 2022-08-15 NOTE — Telephone Encounter (Signed)
-----   Message from Meriam Sprague, MD sent at 08/15/2022  7:08 AM EDT ----- Kidney function and electrolytes look good. Blood counts are stable. Cholesterol is well controlled.   A1C is 8.5 which is significantly up from 6.5 previously. He is now solidly within the diabetic range. Does he have a primary who can help get him on the appropriate medications? We can start metformin 500mg  BID for now and if he does not have a primary, would recommend Hillard Danker at [place].

## 2022-09-11 ENCOUNTER — Ambulatory Visit (HOSPITAL_COMMUNITY): Payer: Medicare HMO | Attending: Cardiology

## 2022-09-11 DIAGNOSIS — I5022 Chronic systolic (congestive) heart failure: Secondary | ICD-10-CM | POA: Insufficient documentation

## 2022-09-11 LAB — ECHOCARDIOGRAM COMPLETE
Area-P 1/2: 3.65 cm2
S' Lateral: 3.2 cm

## 2023-02-09 ENCOUNTER — Other Ambulatory Visit: Payer: Self-pay | Admitting: *Deleted

## 2023-02-09 DIAGNOSIS — N1831 Chronic kidney disease, stage 3a: Secondary | ICD-10-CM

## 2023-02-09 DIAGNOSIS — E7800 Pure hypercholesterolemia, unspecified: Secondary | ICD-10-CM

## 2023-02-09 DIAGNOSIS — I420 Dilated cardiomyopathy: Secondary | ICD-10-CM

## 2023-02-09 DIAGNOSIS — I5022 Chronic systolic (congestive) heart failure: Secondary | ICD-10-CM

## 2023-02-09 DIAGNOSIS — Z79899 Other long term (current) drug therapy: Secondary | ICD-10-CM

## 2023-02-09 DIAGNOSIS — R7989 Other specified abnormal findings of blood chemistry: Secondary | ICD-10-CM

## 2023-02-09 DIAGNOSIS — I10 Essential (primary) hypertension: Secondary | ICD-10-CM

## 2023-02-09 MED ORDER — SIMVASTATIN 5 MG PO TABS
5.0000 mg | ORAL_TABLET | Freq: Every day | ORAL | 0 refills | Status: DC
Start: 2023-02-09 — End: 2023-05-02

## 2023-02-09 MED ORDER — FUROSEMIDE 20 MG PO TABS
20.0000 mg | ORAL_TABLET | Freq: Every day | ORAL | 0 refills | Status: DC
Start: 2023-02-09 — End: 2023-05-02

## 2023-02-09 MED ORDER — METFORMIN HCL 500 MG PO TABS: 500.0000 mg | ORAL_TABLET | Freq: Two times a day (BID) | ORAL | 0 refills | Status: DC

## 2023-02-09 MED ORDER — LISINOPRIL 40 MG PO TABS: 40.0000 mg | ORAL_TABLET | Freq: Every day | ORAL | 0 refills | Status: DC

## 2023-02-09 MED ORDER — SPIRONOLACTONE 25 MG PO TABS: 25.0000 mg | ORAL_TABLET | Freq: Every day | ORAL | 0 refills | Status: DC

## 2023-02-09 MED ORDER — CARVEDILOL 12.5 MG PO TABS: 12.5000 mg | ORAL_TABLET | Freq: Two times a day (BID) | ORAL | 0 refills | Status: DC

## 2023-02-09 NOTE — Progress Notes (Signed)
Refills have been sent.  

## 2023-04-06 ENCOUNTER — Telehealth: Payer: Self-pay | Admitting: Cardiovascular Disease

## 2023-04-06 DIAGNOSIS — Z79899 Other long term (current) drug therapy: Secondary | ICD-10-CM

## 2023-04-06 DIAGNOSIS — I10 Essential (primary) hypertension: Secondary | ICD-10-CM

## 2023-04-06 DIAGNOSIS — N1831 Chronic kidney disease, stage 3a: Secondary | ICD-10-CM

## 2023-04-06 MED ORDER — AMLODIPINE BESYLATE 2.5 MG PO TABS: 2.5000 mg | ORAL_TABLET | Freq: Every day | ORAL | 0 refills | Status: DC

## 2023-04-06 NOTE — Telephone Encounter (Signed)
*  STAT* If patient is at the pharmacy, call can be transferred to refill team.   1. Which medications need to be refilled? (please list name of each medication and dose if known) Amlodopine 2.5 MG Tab   2. Would you like to learn more about the convenience, safety, & potential cost savings by using the [facility]? no     3. Are you open to using the [facility] (Type [facility]. no ).   4. Which pharmacy/location (including street and city if local pharmacy) is medication to be sent to? [facility]  [address]  [place] [address]  [phone] [employer]/mail order   5. Do they need a 30 day or 90 day supply? 90

## 2023-04-06 NOTE — Telephone Encounter (Signed)
 Refill Request.

## 2023-04-06 NOTE — Telephone Encounter (Signed)
 Pt's medication was sent to pt's pharmacy as requested. Confirmation received.

## 2023-04-17 ENCOUNTER — Encounter: Payer: Self-pay | Admitting: *Deleted

## 2023-04-24 ENCOUNTER — Ambulatory Visit: Payer: Medicare HMO | Admitting: Cardiovascular Disease

## 2023-04-24 NOTE — Progress Notes (Deleted)
 Cardiology Office Note   Date:  04/24/2023   ID:  LOUIE FLENNER, DOB 19-Nov-1950, MRN [id number]  PCP:  Dessa Phi, MD  Cardiologist:   Lorine Bears, MD   No chief complaint on file.     History of Present Illness: Jesse Torres is a 73 y.o. male who presents to establish cardiovascular care with me.  She was previously followed by Dr. Shari Prows.  She has known history of chronic systolic heart failure due to nonischemic cardiomyopathy with recovered ejection fraction, chronic kidney disease, pulmonary hypertension and chronic thrombocytopenia. Initial ejection fraction was 35% in 2014 with no previous cardiac catheterization.  Stress testing was negative for ischemia.  Most recent echocardiogram in July 2024 showed normal LV systolic function, mild pulmonary hypertension and mild to moderate tricuspid regurgitation.    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   Chronic systolic CHF (congestive heart failure) (HCC)    a. Echocardiogram (02/05/2013): Mild LVH, EF 25-30%, diffuse HK, mild MR, mild LAE, mild RVE, moderately reduced RVSF, mild RAE, PASP 64, trivial effusion.;  b.  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 30 tablet 0   amLODipine (NORVASC) 2.5 MG tablet Take 1 tablet (2.5 mg total) by mouth daily. 90 tablet 0   carvedilol (COREG) 12.5 MG tablet Take 1 tablet (12.5 mg total) by mouth 2 (two) times daily with a meal. 180 tablet 0   furosemide (LASIX) 20 MG tablet Take 1 tablet (20 mg total) by mouth daily. 90 tablet 0   glucose blood (ACCU-CHEK GUIDE) test strip Use as instructed twice per day and as directed 100 each 12   lisinopril (ZESTRIL) 40 MG tablet Take 1 tablet (40 mg total) by mouth daily. 90 tablet 0   metFORMIN (GLUCOPHAGE) 500 MG tablet Take 1 tablet (500 mg total) by mouth 2 (two) times daily with a meal. 180 tablet 0   simvastatin (ZOCOR) 5 MG tablet Take 1 tablet (5 mg total) by mouth daily. 90 tablet 0   spironolactone (ALDACTONE) 25 MG tablet Take 1 tablet (25 mg total) by mouth daily. 90 tablet 0   No current facility-administered medications for this visit.    Allergies:   Patient has no known allergies.    Social History:  The patient  reports that he quit smoking about 28 years ago. His smoking use included cigarettes. He has never used smokeless tobacco. He reports that he does not drink alcohol and does not use drugs.  Family History:  The patient's ***family history includes Heart attack in his mother; Stroke in his father.    ROS:  Please see the history of present illness.   Otherwise, review of systems are positive for {NONE DEFAULTED:18576}.   All other systems are reviewed and negative.    PHYSICAL EXAM: VS:  There were no vitals taken for this visit. , BMI There is no height or weight on file to calculate BMI. GEN: Well nourished, well developed, in no acute distress  HEENT: normal  Neck: no JVD, carotid bruits, or masses Cardiac: ***RRR; no murmurs, rubs, or gallops,no edema   Respiratory:  clear to auscultation bilaterally, normal work of breathing GI: soft, nontender, nondistended, + BS MS: no deformity or atrophy  Skin: warm and dry, no rash Neuro:  Strength and sensation are intact Psych: euthymic mood, full affect   EKG:  EKG {ACTION; IS/IS ZOX:09604540} ordered today. The ekg ordered today demonstrates ***   Recent Labs: 08/14/2022: ALT 9; BUN 28; Creatinine, Ser 1.23; Hemoglobin 14.0; Platelets 103; Potassium 4.2; Sodium 135    Lipid Panel    Component Value Date/Time   CHOL 112 08/14/2022 0958   TRIG 125 08/14/2022 0958   HDL 32 (L) 08/14/2022 0958   CHOLHDL 3.5 08/14/2022 0958   CHOLHDL 3.8 01/26/2016 0820   VLDL 24 01/26/2016 0820   LDLCALC 57 08/14/2022 0958      Wt Readings from Last 3 Encounters:  08/14/22 210 lb 3.2 oz (95.3 kg)  09/26/21 220 lb 6.4 oz (100 kg)  03/16/21 223 lb 12.8 oz (101.5 kg)      Other studies Reviewed: Additional studies/ records that were reviewed today include: ***. Review of the above records demonstrates: ***      No data to display            ASSESSMENT AND PLAN:  1.  Chronic systolic heart failure with improved ejection fraction:  2.  Essential hypertension:  3.  Hyperlipidemia: Recommend a target LDL of less than 70 given that she is diabetic.    Disposition:   FU with *** in {gen number 9-81:191478} {Days to years:10300}  Signed,  Lorine Bears, MD  04/24/2023 1:44 PM    [facility]

## 2023-04-29 NOTE — Progress Notes (Unsigned)
 Cardiology Office Note    Date:  05/02/2023   ID:  Jesse Torres, DOB January 01, 1951, MRN [id number]  PCP:  Dessa Phi, MD  Cardiologist:  Meriam Sprague, MD (Inactive)  Electrophysiologist:  None   Chief Complaint: Follow-up  History of Present Illness:   Jesse Torres is a 73 y.o. male with history of presumed non-ischemic cardiomyopathy due to hypertension, HFimpEF, hypertension, CKD, hyperlipidemia, diabetes mellitus, chronic thrombocytopenia, and pulmonary hypertension who presents for follow up on HFimpEF.   Patient was admitted 02/2013 with volume overload for presumed HTN cardiomyopathy. Initial echo 01/2013 showing LVEF 25-30%. Nuclear stress testing at that time without ischemia. He has been compliant with GDMT with improved EF 60-65% in 04/2014. He was most recently seen 08/14/2022 for routine follow up and was doing well from a cardiac standpoint with NYHA class I/II symptoms. No medication changes were made. Echo 08/2022 showed LVEF 60-65%. A1C was checked and within range for diagnosis of type 2 diabetes mellitus. He was started on metformin and encouraged to follow up with PCP for further management.   His prior cardiologist with [facility] moved and he would like to transfer his care here to [facility] since it is closer to his house. Today he is doing well from a cardiac perspective. 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 He does report mild lower extremity swelling when he spends a lot of time on his feet which resolves in the evening. He takes his blood pressure every morning right after taking his medications and tells me it typically runs 120-130/70-80. He denies chest pain, shortness of breath, palpitations, lightheadedness, dizziness, orthopnea, and PND. He is taking his medications as prescribed with no adverse effects. He tells me his prior PCP's office closed down over two years ago and he has not established with someone  new yet.   Labs independently reviewed: 08/2022 - TC 112, TG 125, HDL 32, LDL 57, A1c 8.4, Hgb 14.0, PLT 103, BUN 20, serum creatinine 1.23, potassium 4.2, albumin 4.2, AST/ALT normal 07/2017 - TSH normal  Past Medical History:  Diagnosis Date   Cardiomyopathy (HCC)    probable HTN cardiomyopathy;  Lexiscan Myoview (02/07/2013): EF 34%, old small apical infarct, no ischemia, moderate apical hypokinesis.   Chronic systolic CHF (congestive heart failure) (HCC)    a. Echocardiogram (02/05/2013): Mild LVH, EF 25-30%, diffuse HK, mild MR, mild LAE, mild RVE, moderately reduced RVSF, mild RAE, PASP 64, trivial effusion.;  b.  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   [DISCONTINUED] carvedilol (COREG) 12.5 MG tablet Take 1 tablet (12.5 mg total) by mouth 2 (  two) times daily with a meal.   [DISCONTINUED] furosemide (LASIX) 20 MG tablet Take 1 tablet (20 mg total) by mouth daily.   [DISCONTINUED] lisinopril (ZESTRIL) 40 MG tablet Take 1 tablet (40 mg total) by mouth daily.   [DISCONTINUED] metFORMIN (GLUCOPHAGE) 500 MG tablet Take 1 tablet (500 mg total) by mouth 2 (two) times daily with a meal.   [DISCONTINUED] simvastatin (ZOCOR) 5 MG tablet Take 1  tablet (5 mg total) by mouth daily.   [DISCONTINUED] spironolactone (ALDACTONE) 25 MG tablet Take 1 tablet (25 mg total) by mouth daily.    Allergies:   Patient has no known allergies.   Social History   Socioeconomic History   Marital status: Married    Spouse name: Not on file   Number of children: Not on file   Years of education: Not on file   Highest education level: Not on file  Occupational History   Not on file  Tobacco Use   Smoking status: Former    Current packs/day: 0.00    Types: Cigarettes    Quit date: 02/06/1995    Years since quitting: 28.2   Smokeless tobacco: Never  Vaping Use   Vaping status: Unknown  Substance and Sexual Activity   Alcohol use: No   Drug use: No   Sexual activity: Not on file  Other Topics Concern   Not on file  Social History Narrative   Not on file   Social Drivers of Health   Financial Resource Strain: Not on file  Food Insecurity: Not on file  Transportation Needs: Not on file  Physical Activity: Not on file  Stress: Not on file  Social Connections: Not on file     Family History:  The patient's family history includes Heart attack in his mother; Stroke in his father.  ROS:   12-point review of systems is negative unless otherwise noted in the HPI.   EKGs/Labs/Other Studies Reviewed:    Studies reviewed were summarized above. The additional studies were reviewed today:  2D echo 09/11/2022: 1. Left ventricular ejection fraction, by estimation, is 60 to 65%. The  left ventricle has normal function. The left ventricle has no regional  wall motion abnormalities. Left ventricular diastolic parameters are  consistent with Grade I diastolic  dysfunction (impaired relaxation). The average left ventricular global  longitudinal strain is -24.7 %. The global longitudinal strain is normal.   2. Right ventricular systolic function is normal. The right ventricular  size is mildly enlarged. There is mildly elevated pulmonary  artery  systolic pressure. The estimated right ventricular systolic pressure is  41.7 mmHg.   3. The mitral valve is normal in structure. Trivial mitral valve  regurgitation. No evidence of mitral stenosis.   4. Tricuspid valve regurgitation is mild to moderate.   5. The aortic valve is tricuspid. Aortic valve regurgitation is not  visualized. Aortic valve sclerosis is present, with no evidence of aortic  valve stenosis.   6. The inferior vena cava is normal in size with greater than 50%  respiratory variability, suggesting right atrial pressure of 3 mmHg.   Comparison(s): No significant change from prior study.  __________  2D echo 05/13/2014: - Left ventricle: The cavity size was normal. Wall thickness was    normal. Systolic function was normal. The estimated ejection    fraction was in the range of 60% to 65%. Wall motion was normal;    there were no regional wall motion abnormalities.  - Left atrium: The atrium was  mildly dilated.  - Pulmonary arteries: Systolic pressure was mildly increased. PA    peak pressure: 33 mm Hg (S).  __________  2D echo 05/06/2013: - Left ventricle: The cavity size was mildly dilated. Wall    thickness was normal. Systolic function was moderately to    severely reduced. The estimated ejection fraction was in    the range of 30% to 35%. Diffuse hypokinesis. Features are    consistent with a pseudonormal left ventricular filling    pattern, with concomitant abnormal relaxation and    increased filling pressure (grade 2 diastolic    dysfunction). Doppler parameters are consistent with high    ventricular filling pressure.  - Left atrium: The atrium was moderately dilated.  - Right ventricle: Systolic function was mildly reduced.  - Right atrium: The atrium was mildly dilated.  - Pulmonary arteries: Systolic pressure was moderately    increased. PA peak pressure: 46mm Hg (S).  Impressions:   - Compared to 02/05/13 LV function slightly improved but     remains moderate to severely depressed.  __________  Eugenie Birks MPI 02/07/2013: IMPRESSION:  1. 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 There is no significant reversibility.   2. Systolic LV dysfunction with EF 34% with moderate apical  hypokinesis.  __________  2D echo 02/05/2013: - Left ventricle: The cavity size was mildly dilated. Wall    thickness was increased in a pattern of mild LVH. Systolic    function was severely reduced. The estimated ejection    fraction was in the range of 25% to 30%. Diffuse    hypokinesis.  - Mitral valve: Mild regurgitation.  - Left atrium: The atrium was mildly dilated.  - Right ventricle: The cavity size was mildly dilated.    Systolic function was moderately reduced.  - Right atrium: The atrium was mildly dilated.  - Pulmonary arteries: Systolic pressure was severely    increased. PA peak pressure: 64mm Hg (S).  - Pericardium, extracardiac: A trivial pericardial effusion    was identified.    EKG:  EKG is ordered today.  The EKG ordered today demonstrates sinus rhythm with rare PVC, low voltage QRS, left axis deviation, and incomplete RBBB  Recent Labs: 08/14/2022: ALT 9; BUN 28; Creatinine, Ser 1.23; Hemoglobin 14.0; Platelets 103; Potassium 4.2; Sodium 135  Recent Lipid Panel    Component Value Date/Time   CHOL 112 08/14/2022 0958   TRIG 125 08/14/2022 0958   HDL 32 (L) 08/14/2022 0958   CHOLHDL 3.5 08/14/2022 0958   CHOLHDL 3.8 01/26/2016 0820   VLDL 24 01/26/2016 0820   LDLCALC 57 08/14/2022 0958    PHYSICAL EXAM:    VS:  BP 122/70 (BP Location: Left Arm, Patient Position: Sitting, Cuff Size: Normal)   Pulse 76   Ht 5\' 7"  (1.702 m)   Wt 214 lb 3.2 oz (97.2 kg)   SpO2 99%   BMI 33.55 kg/m   BMI: Body mass index is 33.55 kg/m.  Physical Exam Constitutional:      Appearance: Normal appearance.  Neck:     Vascular: No carotid bruit.  Cardiovascular:     Rate and Rhythm: Normal  rate and regular rhythm.     Pulses: Normal pulses.     Heart sounds: Normal heart sounds.  Pulmonary:     Effort: Pulmonary effort is normal.     Breath sounds: Normal breath sounds.  Musculoskeletal:     Right lower leg: Edema (trace) present.  Left lower leg: Edema (trace) present.  Skin:    General: Skin is warm and dry.  Neurological:     Mental Status: He is alert and oriented to person, place, and time.  Psychiatric:        Mood and Affect: Mood normal.        Behavior: Behavior normal.    Wt Readings from Last 3 Encounters:  05/02/23 214 lb 3.2 oz (97.2 kg)  08/14/22 210 lb 3.2 oz (95.3 kg)  09/26/21 220 lb 6.4 oz (100 kg)     ASSESSMENT & PLAN:   Hypertensive cardiomyopathy Chronic systolic HF with recovered EF - Presumed hypertensive cardiomyopathy, echo 01/2023 with EF 25-30%. Most recent echo 08/2022 with EF 60-65%. Appears euvolemic and well compensated on exam today. Continue carvedilol 12.5 mg twice daily, furosemide 20 mg daily, lisinopril 40 mg daily, and spironolactone 25 mg daily. Monitor weights. Low sodium diet. Consider addition of SGLT2i, however patient would not like to make any medication changes today as he is asymptomatic.   Hypertension - BP well controlled in clinic and per reported at home readings. Continue amlodipine 2.5 mg daily, carvedilol, spironolactone, and lisinopril as above  Hyperlipidemia - Most recent lipid panel 08/2022 with LDL 57, at goal. Continue simvastatin 5 mg daily.   Type 2 diabetes mellitus - A1C 08/2022 8.4. Started on metformin 500 mg twice daily at last visit. He has been compliant with this without adverse effect. Will recheck A1C today. Referral placed to establish with primary care for further management.   Thrombocytopenia - Longstanding history with most recent plt count 08/2022 of 103. Denies excess bleeding. No ASA or anticoagulation. Declined referral to heme in the past. CBC ordered today.   Chronic kidney  disease - Most recent BMP 08/2022 with Cr 1.23. BMP ordered today.   Disposition: F/u with Dr. Kirke Corin in 6 months.  Medication Adjustments/Labs and Tests Ordered: Current medicines are reviewed at length with the patient today.  Concerns regarding medicines are outlined above. Medication changes, Labs and Tests ordered today are summarized above and listed in the Patient Instructions accessible in Encounters.   Velora Mediate, PA-C 05/02/2023 1:58 PM     [facility] [place] [address] [place], [place] [address]

## 2023-05-02 ENCOUNTER — Encounter: Payer: Self-pay | Admitting: Physician Assistant

## 2023-05-02 ENCOUNTER — Ambulatory Visit: Attending: Physician Assistant | Admitting: Physician Assistant

## 2023-05-02 VITALS — BP 122/70 | HR 76 | Ht 67.0 in | Wt 214.2 lb

## 2023-05-02 DIAGNOSIS — I10 Essential (primary) hypertension: Secondary | ICD-10-CM

## 2023-05-02 DIAGNOSIS — I4510 Unspecified right bundle-branch block: Secondary | ICD-10-CM | POA: Diagnosis not present

## 2023-05-02 DIAGNOSIS — I5022 Chronic systolic (congestive) heart failure: Secondary | ICD-10-CM

## 2023-05-02 DIAGNOSIS — N1831 Chronic kidney disease, stage 3a: Secondary | ICD-10-CM | POA: Diagnosis not present

## 2023-05-02 DIAGNOSIS — Z79899 Other long term (current) drug therapy: Secondary | ICD-10-CM

## 2023-05-02 DIAGNOSIS — E7800 Pure hypercholesterolemia, unspecified: Secondary | ICD-10-CM | POA: Diagnosis not present

## 2023-05-02 DIAGNOSIS — Z758 Other problems related to medical facilities and other health care: Secondary | ICD-10-CM

## 2023-05-02 DIAGNOSIS — I493 Ventricular premature depolarization: Secondary | ICD-10-CM | POA: Diagnosis not present

## 2023-05-02 DIAGNOSIS — E119 Type 2 diabetes mellitus without complications: Secondary | ICD-10-CM | POA: Diagnosis not present

## 2023-05-02 MED ORDER — FUROSEMIDE 20 MG PO TABS: 20.0000 mg | ORAL_TABLET | Freq: Every day | ORAL | 3 refills | Status: DC

## 2023-05-02 MED ORDER — SIMVASTATIN 5 MG PO TABS: 5.0000 mg | ORAL_TABLET | Freq: Every day | ORAL | 3 refills | Status: AC

## 2023-05-02 MED ORDER — LISINOPRIL 40 MG PO TABS: 40.0000 mg | ORAL_TABLET | Freq: Every day | ORAL | 3 refills | Status: AC

## 2023-05-02 MED ORDER — AMLODIPINE BESYLATE 2.5 MG PO TABS: 2.5000 mg | ORAL_TABLET | Freq: Every day | ORAL | 3 refills | Status: DC

## 2023-05-02 MED ORDER — CARVEDILOL 12.5 MG PO TABS: 12.5000 mg | ORAL_TABLET | Freq: Two times a day (BID) | ORAL | 3 refills | Status: DC

## 2023-05-02 MED ORDER — METFORMIN HCL 500 MG PO TABS: 500.0000 mg | ORAL_TABLET | Freq: Two times a day (BID) | ORAL | 3 refills | Status: DC

## 2023-05-02 MED ORDER — SPIRONOLACTONE 25 MG PO TABS: 25.0000 mg | ORAL_TABLET | Freq: Every day | ORAL | 3 refills | Status: AC

## 2023-05-02 NOTE — Patient Instructions (Addendum)
 Medication Instructions:  Your Physician recommend you continue on your current medication as directed.    *If you need a refill on your cardiac medications before your next appointment, please call your pharmacy*   Lab Work: Your provider would like for you to have following labs drawn today BMET, CBC, and A1C.   If you have labs (blood work) drawn today and your tests are completely normal, you will receive your results only by: MyChart Message (if you have MyChart) OR A paper copy in the mail If you have any lab test that is abnormal or we need to change your treatment, we will call you to review the results.   Follow-Up: At [facility], you and your health needs are our priority.  As part of our continuing mission to provide you with exceptional heart care, we have created designated Provider Care Teams.  These Care Teams include your primary Cardiologist (physician) and Advanced Practice Providers (APPs -  Physician Assistants and Nurse Practitioners) who all work together to provide you with the care you need, when you need it.  We recommend signing up for the patient portal called "MyChart".  Sign up information is provided on this After Visit Summary.  MyChart is used to connect with patients for Virtual Visits (Telemedicine).  Patients are able to view lab/test results, encounter notes, upcoming appointments, etc.  Non-urgent messages can be sent to your provider as well.   To learn more about what you can do with MyChart, go to [email or web address].    Your next appointment:   6 month(s)  Provider:   Lorine Bears, MD   Handout for [facility] to establish PCP  [facility] Ambulatory Nurse Practitioner Patient Consult  Name: Jesse Torres DOB: 1951/02/07       MRN: [id number]      PCP: Dessa Phi, MD  Received consult order [phone]) to [facility] Care Management for patient. Patient screened to assess for potential [facility] [[facility]] Care  Management service(s) (upon discharge). Met with Patient who is agreeable to services for care coordination and contact information secured.   [facility] Care Management will schedule a Care Coordination home visit.  [facility] Care Management Care Coordination home visit does not replace or interfere with any arrangements made by the Inpatient Transition of Care team.  For questions contact:  Langston Reusing, [facility] Care Manager Email: [email or web address] Phone: [phone]

## 2023-05-03 ENCOUNTER — Telehealth: Payer: Self-pay | Admitting: *Deleted

## 2023-05-03 LAB — CBC
Hematocrit: 40.9 % (ref 37.5–51.0)
Hemoglobin: 13.5 g/dL (ref 13.0–17.7)
MCH: 27.6 pg (ref 26.6–33.0)
MCHC: 33 g/dL (ref 31.5–35.7)
MCV: 84 fL (ref 79–97)
Platelets: 111 10*3/uL — ABNORMAL LOW (ref 150–450)
RBC: 4.89 x10E6/uL (ref 4.14–5.80)
RDW: 14.8 % (ref 11.6–15.4)
WBC: 4.9 10*3/uL (ref 3.4–10.8)

## 2023-05-03 LAB — BASIC METABOLIC PANEL
BUN/Creatinine Ratio: 17 (ref 10–24)
BUN: 18 mg/dL (ref 8–27)
CO2: 23 mmol/L (ref 20–29)
Calcium: 9.2 mg/dL (ref 8.6–10.2)
Chloride: 96 mmol/L (ref 96–106)
Creatinine, Ser: 1.03 mg/dL (ref 0.76–1.27)
Glucose: 289 mg/dL — ABNORMAL HIGH (ref 70–99)
Potassium: 4.1 mmol/L (ref 3.5–5.2)
Sodium: 135 mmol/L (ref 134–144)
eGFR: 77 mL/min/{1.73_m2} (ref >59)

## 2023-05-03 LAB — HEMOGLOBIN A1C
Est. average glucose Bld gHb Est-mCnc: 229 mg/dL
Hgb A1c MFr Bld: 9.6 % — ABNORMAL HIGH (ref 4.8–5.6)

## 2023-05-03 NOTE — Progress Notes (Signed)
 Complex Care Management Note Care Guide Note  05/03/2023 Name: ROLAN WRIGHTSMAN MRN: [id number] DOB: 02/19/1950   Complex Care Management Outreach Attempts: An unsuccessful telephone outreach was attempted today to offer the patient information about available complex care management services.  Follow Up Plan:  Additional outreach attempts will be made to offer the patient complex care management information and services.   Encounter Outcome:  No Answer  Gwenevere Ghazi  [facility]  [facility], [facility] Guide  Direct Dial: [phone]  Fax [phone]

## 2023-05-07 NOTE — Progress Notes (Signed)
 Complex Care Management Note  Care Guide Note 05/07/2023 Name: Jesse Torres MRN: [id number] DOB: 1950-09-25  Jesse Torres is a 73 y.o. year old male who sees Dessa Phi, MD for primary care. I reached out to Jesse Torres by phone today to offer complex care management services.  [name] was given information about Complex Care Management services today including:   The Complex Care Management services include support from the care team which includes your Nurse Care Manager, Clinical Social Worker, or Pharmacist.  The Complex Care Management team is here to help remove barriers to the health concerns and goals most important to you. Complex Care Management services are voluntary, and the patient may decline or stop services at any time by request to their care team member.   Complex Care Management Consent Status: Patient agreed to services and verbal consent obtained.   Follow up plan:  Telephone appointment with complex care management team member scheduled for:  3/25  Encounter Outcome:  Patient Scheduled  Gwenevere Ghazi  [facility]  [facility], [facility] Guide  Direct Dial: [phone]  Fax [phone]

## 2023-05-08 ENCOUNTER — Other Ambulatory Visit: Payer: Self-pay

## 2023-05-08 ENCOUNTER — Telehealth: Payer: Self-pay

## 2023-05-08 NOTE — Patient Outreach (Signed)
 Care Coordination   Initial Visit Note   05/08/2023 Name: Jesse Torres MRN: [id number] DOB: 03/13/1950  Jesse Torres is a 73 y.o. year old male who sees Dessa Phi, MD for primary care. I spoke with  Jesse Torres by phone today.  What matters to the patients health and wellness today?  Patient wishes to establish care with a new PCP, preferably at [facility] location.  RNCM conducted initial assessment to determine ongoing Complex Care Management needs.  Patient is aware his A1C has increased, attributes it to an increase in carbohydrates in his daily meals, reports has now decreased carbs.  Does not currently monitor his blood sugar because he has no testing stirps, but does have a monitor.  Patient reports needs a new prescription for test strips written by his new PCP (TBD), and he will resume regular testing.  Patient currently takes his blood pressure and body weight daily, does not track in a log book.     Goals Addressed             This Visit's Progress    .RNCM        RNCM Care Management Expected Outcomes: Monitor, Self-Manage and Reduce Symptoms of: Diabetes and HTN  Interventions Today    Flowsheet Row Most Recent Value  Chronic Disease   Chronic disease during today's visit Diabetes, Congestive Heart Failure (CHF)  [Discussed importance of regular BS monitoring. Provided education on daily weight monitoring, reporting to provider weight gain of >2 lb per day or >5 lb per week.]  General Interventions   General Interventions Discussed/Reviewed General Interventions Discussed, Annual Eye Exam, Labs, Annual Foot Exam, Durable Medical Equipment (DME), Vaccines, Health Screening, Doctor Visits  [Patient reports has annual eye exam in May, S/P cataract surgery one year ago.  Patient agreed to schedule annual foot exam.  Patient expressed desire to establish new PCP at [facility].  RN routed request to [facility] to contact pt to schedule]  Labs Hgb  A1c every 6 months  [Patient aware last A1C resutls abnormal.  RNCM counseled patient on dietary changes, and need for regular BS monitoring.]  Vaccines COVID-19, Flu  [Patient to address care gaps at time of new PCP appointment]  Doctor Visits Discussed/Reviewed Doctor Visits Discussed, PCP, Specialist  [confirmed patient to establish with new PCP and remain complient with upcoming appointments.]  Durable Medical Equipment (DME) BP Cuff, Glucomoter  [RN confirmed patient will resume regular BS testing when new PCP orders more test strips]  PCP/Specialist Visits Compliance with follow-up visit  Exercise Interventions   Exercise Discussed/Reviewed Physical Activity  [Encouraged patient to continue his physical activity and increase as able]  Physical Activity Discussed/Reviewed Physical Activity Discussed  Education Interventions   Education Provided Provided Education  [facility] education about importance of BS monitoring, daily blood pressure and body weights and when to report to provider]  Provided Verbal Education On Blood Sugar Monitoring, Medication, When to see the doctor  Nutrition Interventions   Nutrition Discussed/Reviewed Nutrition Discussed  [Discussed reducing carbohydrates in daily meals due to inceased A1C]  Pharmacy Interventions   Pharmacy Dicussed/Reviewed Medication Adherence, Pharmacy Topics Discussed  [Patient will need to establish new PCP, and needs new order for test strips]  Safety Interventions   Safety Discussed/Reviewed Safety Discussed  [patient denies need for grab bars or mobility devices]              SDOH assessments and interventions completed:  Yes  SDOH Interventions Today  Flowsheet Row Most Recent Value  SDOH Interventions   Food Insecurity Interventions Intervention Not Indicated  Housing Interventions Intervention Not Indicated  Utilities Interventions Intervention Not Indicated  Alcohol Usage Interventions Intervention Not Indicated (Score  <7)        Care Coordination Interventions:  Yes, provided   Follow up plan: Referral made to [facility] Guide to outreach to patient to establish care.    Encounter Outcome:  Patient Visit Completed    Ruel Favors BSN RN CCM [place]  [facility], [facility] Health RN Care Manager Direct Dial: [phone] Fax: [phone]

## 2023-05-08 NOTE — Patient Instructions (Signed)
 Visit Information  Thank you for taking time to visit with me today. Please don't hesitate to contact me if I can be of assistance to you before our next scheduled telephone appointment.  Following are the goals we discussed today:  Establish care with new PCP at [facility].  Someone from the clinic will be calling you to discuss.  Continue to take medications as prescribed. Continue to attend provider visits as scheduled Continue to eat healthy, lean meats, vegetables, fruits, avoid saturated and transfats Contact provider with health questions or concerns as needed Continue to check blood sugar as recommended and notify provider if questions or concerns Continue to check blood pressure routinely and contact provider if questions or concerns  Please call the care guide team at [phone] if you need to cancel or reschedule your appointment.   If you are experiencing a Mental Health or Behavioral Health Crisis or need someone to talk to, please call the Suicide and Crisis Lifeline: 988 call the [place] National Suicide Prevention Lifeline: 409-565-0264 or TTY: 289-738-9235 TTY 475-381-5595) to talk to a trained counselor call 1-800-273-TALK (toll free, 24 hour hotline)    Ruel Favors BSN RN CCM [place]  [facility], [facility] Health RN Care Manager Direct Dial: [phone] Fax: [phone]

## 2023-05-08 NOTE — Progress Notes (Signed)
 Complex Care Management Note  Care Guide Note 05/08/2023 Name: Jesse Torres MRN: [id number] DOB: 1950-09-21  Jesse Torres is a 73 y.o. year old male who sees Dessa Phi, MD for primary care. I reached out to Jesse Torres by phone today to offer complex care management services.  [name] was given information about Complex Care Management services today including:   The Complex Care Management services include support from the care team which includes your Nurse Care Manager, Clinical Social Worker, or Pharmacist.  The Complex Care Management team is here to help remove barriers to the health concerns and goals most important to you. Complex Care Management services are voluntary, and the patient may decline or stop services at any time by request to their care team member.   Complex Care Management Consent Status: Patient agreed to services and verbal consent obtained.   Follow up plan:  Telephone appointment with complex care management team member scheduled for:  05/25/2023  Encounter Outcome:  Patient Scheduled Penne Lash , RMA     [place]  [facility], [facility] Guide  Direct Dial: [phone]  Website: [email or web address]

## 2023-05-25 ENCOUNTER — Ambulatory Visit: Payer: Self-pay

## 2023-05-25 ENCOUNTER — Other Ambulatory Visit: Payer: Self-pay

## 2023-05-25 NOTE — Patient Instructions (Signed)
 Visit Information  Thank you for taking time to visit with me today. Please don't hesitate to contact me if I can be of assistance to you before our next scheduled appointment.  Our next appointment is by telephone on 06/27/23 at 1:00 pm Please call the care guide team at [phone] if you need to cancel or reschedule your appointment.   Following is a copy of your care plan:   Goals Addressed             This Visit's Progress    VBCI RN Care Plan- diabetes       Problems:  Chronic Disease Management support and education needs related to DMII Knowledge Deficits related to DMII  Goal: Over the next 3 months the Patient will attend all scheduled medical appointments: the providers as evidenced by patient report / chart review        continue to work with RN Care Manager and/or Social Worker to address care management and care coordination needs related to DMII as evidenced by adherence to CM Team Scheduled appointments     demonstrate Ongoing adherence to prescribed treatment plan for DMII as evidenced by patient report / chart review.  take all medications exactly as prescribed and will call provider for medication related questions as evidenced by patient report.      Interventions:   Diabetes Interventions: Assessed patient's understanding of A1c goal: <7% Provided education to patient about basic DM disease process Reviewed medications with patient and discussed importance of medication adherence Discussed plans with patient for ongoing care management follow up and provided patient with direct contact information for care management team Reviewed scheduled/upcoming provider appointments including:   Advised to notify provider for frequent blood sugars <70 or >250 Discussed plate method for diabetes diet management.  Advised to limit simple carbohydrates, sweets, white potato's, white rice Lab Results  Component Value Date   HGBA1C 9.6 (H) 05/02/2023    Patient Self-Care  Activities:  Attend all scheduled provider appointments Call pharmacy for medication refills 3-7 days in advance of running out of medications Call provider office for new concerns or questions  Take medications as prescribed   check blood sugar at prescribed times: as recommended by your provider. check feet daily for cuts, sores or redness take the blood sugar log to all doctor visits Request prescription for glucometer test strips at primary care provider visit.   Plan:  Telephone follow up appointment with care management team member scheduled for:  for 1 month  George Ina RN, BSN, CCM [place]  [facility], Population Health Case Manager Phone: [phone]           VBCI RN Care Plan- heart failure       Problems:  Chronic Disease Management support and education needs related to CHF  Goal: Over the next 3 months the Patient will attend all scheduled medical appointments: with provider  as evidenced by patient report/ chart review        continue to work with RN Care Manager and/or Social Worker to address care management and care coordination needs related to CHF as evidenced by adherence to CM Team Scheduled appointments     demonstrate Ongoing adherence to prescribed treatment plan for CHF as evidenced by patient report/ chart review.  take all medications exactly as prescribed and will call provider for medication related questions as evidenced by patient report / chart review.     verbalize basic understanding of CHF disease process and self health  management plan as evidenced by patient report  Interventions:   Heart Failure Interventions: Provided education on low sodium diet Assessed need for readable accurate scales in home Advised patient to weigh each morning after emptying bladder Discussed importance of daily weight and advised patient to weigh and record daily Discussed the importance of keeping all appointments with  provider Discussed heart failure action plan Advised to notify provider of worsening symptoms and/ or call 911 for severe symptoms   Patient Self-Care Activities:  Attend all scheduled provider appointments Call pharmacy for medication refills 3-7 days in advance of running out of medications Call provider office for new concerns or questions  Take medications as prescribed   call office if I gain more than 2 pounds in one day or 5 pounds in one week use salt in moderation watch for swelling in feet, ankles and legs every day weigh myself daily  Plan:  Telephone follow up appointment with care management team member scheduled for:  1 month  George Ina RN, BSN, CCM [place]  [facility], Population Health Case Manager Phone: [phone]             Please call the Suicide and Crisis Lifeline: 988 call 1-800-273-TALK (toll free, 24 hour hotline) if you are experiencing a Mental Health or Behavioral Health Crisis or need someone to talk to.  The patient verbalized understanding of instructions, educational materials, and care plan provided today and agreed to receive a mailed copy of patient instructions, educational materials, and care plan.   George Ina RN, BSN, CCM [employer], Population Health Case Manager Phone: [phone]

## 2023-05-25 NOTE — Patient Outreach (Signed)
 Complex Care Management   Visit Note  05/25/2023  Name:  Jesse Torres MRN: [id number] DOB: 1950-12-14  Situation: Referral received for Complex Care Management related to Heart Failure and Diabetes with Complications I obtained verbal consent from Patient.  Visit completed with patient.  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   Chronic systolic CHF (congestive heart failure) (HCC)    a. Echocardiogram (02/05/2013): Mild LVH, EF 25-30%, diffuse HK, mild MR, mild LAE, mild RVE, moderately reduced RVSF, mild RAE, PASP 64, trivial effusion.;  b.  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   Vitrectomy 02-18-2020   Vitreous membranes and strands, left 02/19/2020   Contributing to pseudophakic CME in a scleral fixated posterior chamber lens  Vitrectomy 02-18-20, removal of cortical vitreous    Assessment: Patient Reported Symptoms:  Cognitive Cognitive Status: Alert and oriented to person, place, and time, Insightful and able to interpret abstract concepts, Normal speech and language skills      Neurological      HEENT HEENT Symptoms Reported: Other: Other HEENT Symptoms/Conditions: patient states his ears are " stopped up" Patient states 40-50 years ago he had surgery on bilateral eardrum due to holes in eardrum. States he needs to get his ears cleaned out.  Reports when ears get stopped up it causes his equilibrium to be a little off.      Cardiovascular   Cardiovascular  Conditions: Heart failure, Hypertension Cardiovascular Management Strategies: Routine screening, Medication therapy, Diet modification, Activity Weight: 204 lb (92.5 kg)  Respiratory Respiratory Symptoms Reported: No symptoms reported    Endocrine Patient reports the following symptoms related to hypoglycemia or hyperglycemia : Weight loss Is patient diabetic?: Yes Endocrine Conditions: Diabetes Endocrine Management Strategies: Routine screening, Medication therapy  Gastrointestinal Gastrointestinal Symptoms Reported: No symptoms reported      Genitourinary      Integumentary Integumentary Symptoms Reported: No symptoms reported    Musculoskeletal Additional Musculoskeletal Details: patient reports arthritis in right hip. States no difficulty with walking. Musculoskeletal Conditions: Osteoarthritis      Psychosocial Psychosocial Symptoms Reported: No symptoms reported     Quality of Family Relationships: supportive Do you feel physically threatened by others?: No      08/08/2018    3:52 PM  Depression screen PHQ 2/9  Decreased Interest 0  Down, Depressed, Hopeless 0  PHQ - 2 Score 0  Altered sleeping 0  Tired, decreased energy 0  Change in appetite 0  Feeling bad or failure about yourself  0  Trouble concentrating 0  Moving slowly or fidgety/restless 0  Suicidal thoughts 0  PHQ-9 Score 0  Difficult doing work/chores Not difficult at all    Vitals:   05/25/23 1545  BP: 116/61    Medications Reviewed Today     Reviewed by Otho Ket, RN (Registered Nurse) on 05/25/23 at 1546  Med List Status: <None>   Medication Order Taking? Sig Documenting  Provider Last Dose Status Informant  acetaminophen (TYLENOL) 500 MG tablet [id number] Yes Take 1 tablet (500 mg total) by mouth every 6 (six) hours as needed. Jeanie Sewer, PA-C Taking Active            Med Note Julian Reil, LISA   Tue May 08, 2023 11:26 AM) Patient reports only taking PRN, infrequently needed for headache,  etc  amLODipine (NORVASC) 2.5 MG tablet [id number] Yes Take 1 tablet (2.5 mg total) by mouth daily. Orion Crook, PA-C Taking Active   carvedilol (COREG) 12.5 MG tablet [id number] Yes Take 1 tablet (12.5 mg total) by mouth 2 (two) times daily with a meal. Orion Crook, PA-C Taking Active   furosemide (LASIX) 20 MG tablet [id number] Yes Take 1 tablet (20 mg total) by mouth daily. Orion Crook, PA-C Taking Active   glucose blood (ACCU-CHEK GUIDE) test strip [id number]  Use as instructed twice per day and as directed  Patient not taking: Reported on 05/08/2023   Cain Saupe, MD  Active Self           Med Note Julian Reil, LISA   Tue May 08, 2023 11:28 AM) Patient not testing BS at this time, does not have test strips, needs reordering by PCP to have insurance approval and payment  lisinopril (ZESTRIL) 40 MG tablet [id number] Yes Take 1 tablet (40 mg total) by mouth daily. Orion Crook, PA-C Taking Active   metFORMIN (GLUCOPHAGE) 500 MG tablet [id number] Yes Take 1 tablet (500 mg total) by mouth 2 (two) times daily with a meal. Orion Crook, PA-C Taking Active   simvastatin (ZOCOR) 5 MG tablet [id number] Yes Take 1 tablet (5 mg total) by mouth daily. Orion Crook, PA-C Taking Active   spironolactone (ALDACTONE) 25 MG tablet [id number] Yes Take 1 tablet (25 mg total) by mouth daily. Orion Crook, PA-C Taking Active             Recommendation:   PCP Follow-up  Follow Up Plan:   Telephone follow-up in 3 weeks with RN care manager.   George Ina RN, BSN, CCM [employer], Population Health Case Manager Phone: [phone]

## 2023-06-18 ENCOUNTER — Ambulatory Visit (INDEPENDENT_AMBULATORY_CARE_PROVIDER_SITE_OTHER): Admitting: General Practice

## 2023-06-18 ENCOUNTER — Encounter: Payer: Self-pay | Admitting: General Practice

## 2023-06-18 VITALS — BP 122/80 | HR 53 | Temp 98.3°F | Ht 66.0 in | Wt 207.0 lb

## 2023-06-18 DIAGNOSIS — I2510 Atherosclerotic heart disease of native coronary artery without angina pectoris: Secondary | ICD-10-CM | POA: Diagnosis not present

## 2023-06-18 DIAGNOSIS — I10 Essential (primary) hypertension: Secondary | ICD-10-CM | POA: Diagnosis not present

## 2023-06-18 DIAGNOSIS — I5032 Chronic diastolic (congestive) heart failure: Secondary | ICD-10-CM

## 2023-06-18 DIAGNOSIS — I420 Dilated cardiomyopathy: Secondary | ICD-10-CM

## 2023-06-18 DIAGNOSIS — Z7689 Persons encountering health services in other specified circumstances: Secondary | ICD-10-CM | POA: Insufficient documentation

## 2023-06-18 DIAGNOSIS — E119 Type 2 diabetes mellitus without complications: Secondary | ICD-10-CM

## 2023-06-18 DIAGNOSIS — Z7984 Long term (current) use of oral hypoglycemic drugs: Secondary | ICD-10-CM | POA: Diagnosis not present

## 2023-06-18 DIAGNOSIS — Z1211 Encounter for screening for malignant neoplasm of colon: Secondary | ICD-10-CM | POA: Insufficient documentation

## 2023-06-18 DIAGNOSIS — E785 Hyperlipidemia, unspecified: Secondary | ICD-10-CM | POA: Diagnosis not present

## 2023-06-18 DIAGNOSIS — Z23 Encounter for immunization: Secondary | ICD-10-CM

## 2023-06-18 MED ORDER — ACCU-CHEK GUIDE TEST VI STRP
ORAL_STRIP | 12 refills | Status: AC
Start: 2023-06-18 — End: ?

## 2023-06-18 MED ORDER — METFORMIN HCL 500 MG PO TABS: 1000.0000 mg | ORAL_TABLET | Freq: Two times a day (BID) | ORAL | 0 refills | Status: DC

## 2023-06-18 NOTE — Assessment & Plan Note (Signed)
 Followed by cardiology. Continue simvastatin  5 mg once daily.

## 2023-06-18 NOTE — Assessment & Plan Note (Addendum)
 Chronic and Uncontrolled.  Discussed treatment options and diet with patient at length today.  Increase metformin  from 1000 mg daily to 1000 mg twice daily.  Rx sent. Rx sent for test trips. Prevnar 20 given today. Continue simvastatin . Foot exam will be done at next visit. Urine ACR will be done at next visit. He will have his eye exam later this month.  Follow-up in mid June for repeat A1c.

## 2023-06-18 NOTE — Patient Instructions (Addendum)
 Schedule eye exam- diabetic exam.   Start metformin  500 mg 2 in the morning and 2 at night until you run out.  When you get the new one, please take 1 in the morning and 1 at night.   Follow up in June.  It was a pleasure to meet you today! Please don't hesitate to contact me with any questions. Welcome to [facility]!

## 2023-06-18 NOTE — Assessment & Plan Note (Signed)
 EMR reviewed briefly.

## 2023-06-18 NOTE — Assessment & Plan Note (Signed)
 BP at goal today. Does check blood pressures at home and reports readings below 140/90.  Continue amlodipine  2.5 mg once daily, carvedilol  12.5 twice daily, Lasix  20 mg, lisinopril  40 mg and spironolactone  25 mg once daily.  Followed by cardiology. Reviewed notes and labs from March 2025.

## 2023-06-18 NOTE — Assessment & Plan Note (Signed)
 Controlled. Continue simvastatin  5 mg once daily.

## 2023-06-18 NOTE — Progress Notes (Signed)
 New Patient Office Visit  Subjective    Patient ID: Jesse Torres, male    DOB: [birth year]  Age: 73 y.o. MRN: [id number]  CC:  Chief Complaint  Patient presents with   New Patient (Initial Visit)    HPI Jesse Torres is a 73 y.o. male presents to establish care.   Last PCP/Physical/labs: 2020; [facility]   HTN/pulmonary HTN/CHF: followed by cardiology. Last viist was on 05/02/23. He had a echo on 09/11/22 showed 60-65%. He is currently managed amlodipine  2.5 mg once daily, carvedilol  12.5 twice daily, lasix  20 mg, lisinopril  40 mg once daily, spironolactone  25 mg once daily.   HLD: currently managed on simvastatin  5 mg once daily. Followed by cardiology.  Last lipid panel done August 14, 2022 within normal limits.  Followed by cardiology.  Type 2 DM: hemoglobin A1c- 9.6 on 05/02/23. He is currently metformin  500 mg once twice a day. He has not been checking his blood sugars at home since he ran out of strips. He is not sure if he has polyuria since he is on lasix . He denies any polyphagia or polydipsia. He denies any neuropathy symptoms.   Outpatient Encounter Medications as of 06/18/2023  Medication Sig   acetaminophen  (TYLENOL ) 500 MG tablet Take 1 tablet (500 mg total) by mouth every 6 (six) hours as needed.   amLODipine  (NORVASC ) 2.5 MG tablet Take 1 tablet (2.5 mg total) by mouth daily.   carvedilol  (COREG ) 12.5 MG tablet Take 1 tablet (12.5 mg total) by mouth 2 (two) times daily with a meal.   furosemide  (LASIX ) 20 MG tablet Take 1 tablet (20 mg total) by mouth daily.   glucose blood (ACCU-CHEK GUIDE TEST) test strip Use as instructed to check blood sugar. Dx. E11.9   lisinopril  (ZESTRIL ) 40 MG tablet Take 1 tablet (40 mg total) by mouth daily.   metFORMIN  (GLUCOPHAGE ) 500 MG tablet Take 2 tablets (1,000 mg total) by mouth 2 (two) times daily with a meal.   simvastatin  (ZOCOR ) 5 MG tablet Take 1 tablet (5 mg total) by mouth daily.   spironolactone  (ALDACTONE )  25 MG tablet Take 1 tablet (25 mg total) by mouth daily.   [DISCONTINUED] glucose blood (ACCU-CHEK GUIDE) test strip Use as instructed twice per day and as directed   [DISCONTINUED] metFORMIN  (GLUCOPHAGE ) 500 MG tablet Take 1 tablet (500 mg total) by mouth 2 (two) times daily with a meal.   No facility-administered encounter medications on file as of 06/18/2023.    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   Chronic systolic CHF (congestive heart failure) (HCC)    a. Echocardiogram (02/05/2013): Mild LVH, EF 25-30%, diffuse HK, mild MR, mild LAE, mild RVE, moderately reduced RVSF, mild RAE, PASP 64, trivial effusion.;  b.  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   Vitrectomy 02-18-2020   Vitreous membranes and strands, left 02/19/2020   Contributing to  pseudophakic CME in a scleral fixated posterior chamber lens  Vitrectomy 02-18-20, removal of cortical vitreous    Past Surgical History:  Procedure Laterality Date   Cataract surgery      Family History  Problem Relation Age of Onset   Heart attack Mother    Stroke Father     Social History   Socioeconomic History   Marital status: Married    Spouse name: Not on file   Number of children: Not on file   Years of education: Not on file   Highest education level: Not on file  Occupational History   Not on file  Tobacco Use   Smoking status: Former    Current packs/day: 0.00    Types: Cigarettes    Quit date: 02/06/1995     Years since quitting: 28.3   Smokeless tobacco: Never  Vaping Use   Vaping status: Unknown  Substance and Sexual Activity   Alcohol use: No   Drug use: No   Sexual activity: Not on file  Other Topics Concern   Not on file  Social History Narrative   Not on file   Social Drivers of Health   Financial Resource Strain: Not on file  Food Insecurity: No Food Insecurity (05/08/2023)   Hunger Vital Sign    Worried About Running Out of Food in the Last Year: Never true    Ran Out of Food in the Last Year: Never true  Transportation Needs: No Transportation Needs (05/08/2023)   PRAPARE - Administrator, Civil Service (Medical): No    Lack of Transportation (Non-Medical): No  Physical Activity: Not on file  Stress: Not on file  Social Connections: Not on file  Intimate Partner Violence: Not on file    Review of Systems  Constitutional:  Negative for chills and fever.  Respiratory:  Negative for shortness of breath.   Cardiovascular:  Negative for chest pain.  Gastrointestinal:  Negative for abdominal pain, constipation, diarrhea, heartburn, nausea and vomiting.  Genitourinary:  Negative for dysuria, frequency and urgency.  Neurological:  Negative for dizziness and headaches.  Endo/Heme/Allergies:  Negative for polydipsia.  Psychiatric/Behavioral:  Negative for depression and suicidal ideas. The patient is not nervous/anxious.         Objective    BP 122/80 (BP Location: Left Arm, Patient Position: Sitting, Cuff Size: Normal)   Pulse (!) 53   Temp 98.3 F (36.8 C) (Oral)   Ht 5\' 6"  (1.676 m)   Wt 207 lb (93.9 kg)   SpO2 99%   BMI 33.41 kg/m   Physical Exam Vitals and nursing note reviewed.  Constitutional:      Appearance: Normal appearance.  Cardiovascular:     Rate and Rhythm: Normal rate and regular rhythm.     Pulses: Normal pulses.     Heart sounds: Normal heart sounds.  Pulmonary:     Effort: Pulmonary effort is normal.     Breath sounds: Normal  breath sounds.  Neurological:     Mental Status: He is alert and oriented to person, place, and time.  Psychiatric:        Mood and Affect: Mood normal.        Behavior: Behavior normal.        Thought Content: Thought content normal.        Judgment: Judgment normal.         Assessment & Plan:  Type 2 diabetes mellitus without complication, without long-term current use of insulin (HCC) Assessment &  Plan: Chronic and Uncontrolled.  Discussed treatment options and diet with patient at length today.  Increase metformin  from 1000 mg daily to 1000 mg twice daily.  Rx sent. Rx sent for test trips. Prevnar 20 given today. Continue simvastatin . Foot exam will be done at next visit. Urine ACR will be done at next visit. He will have his eye exam later this month.  Follow-up in mid June for repeat A1c.  Orders: -     Accu-Chek Guide Test; Use as instructed to check blood sugar. Dx. E11.9  Dispense: 100 each; Refill: 12 -     metFORMIN  HCl; Take 2 tablets (1,000 mg total) by mouth 2 (two) times daily with a meal.  Dispense: 360 tablet; Refill: 0  Establishing care with new doctor, encounter for Assessment & Plan: EMR reviewed briefly.     Screening for colon cancer Assessment & Plan: Order for Cologuard.  Orders: -     Cologuard  Need for pneumococcal 20-valent conjugate vaccination -     Pneumococcal conjugate vaccine 20-valent  Coronary artery disease, unspecified vessel or lesion type, unspecified whether angina present, unspecified whether native or transplanted heart Assessment & Plan: Followed by cardiology. Continue simvastatin  5 mg once daily.   Dilated cardiomyopathy [facility]) Assessment & Plan: Reviewed echocardiogram from July 2024. Followed by cardiology. No concerns today.   Dyslipidemia Assessment & Plan: Controlled. Continue simvastatin  5 mg once daily.   Essential hypertension Assessment & Plan: BP at goal today. Does check blood pressures at  home and reports readings below 140/90.  Continue amlodipine  2.5 mg once daily, carvedilol  12.5 twice daily, Lasix  20 mg, lisinopril  40 mg and spironolactone  25 mg once daily.  Followed by cardiology. Reviewed notes and labs from March 2025.   Chronic heart failure with preserved ejection fraction [facility]) Assessment & Plan: Controlled. Continue amlodipine  2.5 mg once daily, carvedilol  12.5 twice daily, lasix  20 mg, lisinopril  40 mg once daily, spironolactone  25 mg once daily.   Reviewed cardiology notes from March 2025.     Return in about 7 weeks (around 08/06/2023) for DM2.   Jolanda Nation, NP

## 2023-06-18 NOTE — Assessment & Plan Note (Signed)
Order for Cologuard     

## 2023-06-18 NOTE — Assessment & Plan Note (Signed)
 Reviewed echocardiogram from July 2024. Followed by cardiology. No concerns today.

## 2023-06-18 NOTE — Assessment & Plan Note (Signed)
 Controlled. Continue amlodipine  2.5 mg once daily, carvedilol  12.5 twice daily, lasix  20 mg, lisinopril  40 mg once daily, spironolactone  25 mg once daily.   Reviewed cardiology notes from March 2025.

## 2023-06-27 ENCOUNTER — Other Ambulatory Visit: Payer: Self-pay

## 2023-06-27 NOTE — Patient Outreach (Signed)
 Complex Care Management   Visit Note  06/27/2023  Name:  Jesse PASSEY MRN: [id number] DOB: 12-Jan-1951  Situation: Referral received for Complex Care Management related to Heart Failure and diabetes I obtained verbal consent from Patient.  Visit completed with patient  on the phone  Background:   Past Medical History:  Diagnosis Date   Cardiomyopathy (HCC)    probable HTN cardiomyopathy;  Lexiscan  Myoview (02/07/2013): EF 34%, old small apical infarct, no ischemia, moderate apical hypokinesis.   Chronic systolic CHF (congestive heart failure) (HCC)    a. Echocardiogram (02/05/2013): Mild LVH, EF 25-30%, diffuse HK, mild MR, mild LAE, mild RVE, moderately reduced RVSF, mild RAE, PASP 64, trivial effusion.;  b.  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   Vitrectomy 02-18-2020   Vitreous membranes and strands, left 02/19/2020   Contributing to pseudophakic CME in a scleral fixated posterior chamber lens  Vitrectomy 02-18-20, removal of cortical vitreous    Assessment: Patient Reported Symptoms:  Cognitive Cognitive Status: Alert and oriented to person, place, and time, Insightful and able to interpret abstract concepts, Normal speech and language skills Cognitive/Intellectual Conditions Management [RPT]: None reported or documented in medical history or problem list   Health Maintenance Behaviors: Annual physical exam Healing Pattern: Average  Neurological Neurological Review of Symptoms: No symptoms reported    HEENT HEENT Symptoms Reported: Other: Other HEENT Symptoms/Conditions: clogged/ stopped up bilateral ears HEENT Conditions:  Ear problem(s) HEENT Comment: patient states he had surgery on both ear drums years ago. He states his ears occasionally become stopped up which causes his balance to be a little off.  Patient states he needs to see a ENT to have a cleaning done. Per chart review patients next follow up visit with his primary care provider is 08/07/23.   Patient declined having RN case manager send message to primary care provider regarding ear concerns. He states he will go to or call the ENT office and request an appointment. Ear problem(s)  Cardiovascular Cardiovascular Symptoms Reported: No symptoms reported Does patient have uncontrolled Hypertension?: No Cardiovascular Conditions: Coronary artery disease, Hypertension, Heart failure Cardiovascular Management Strategies: Routine screening, Medication therapy, Diet modification Weight: 199 lb (90.3 kg) Cardiovascular Self-Management Outcome: 4 (good)  Respiratory Respiratory Symptoms Reported: No symptoms reported    Endocrine Patient reports the following symptoms related to hypoglycemia or hyperglycemia : No symptoms reported Is patient diabetic?: Yes Is patient checking blood sugars at home?: Yes Endocrine Conditions: Diabetes Endocrine Management Strategies: Routine screening, Medication therapy, Diet modification Endocrine Self-Management Outcome: 4 (good) Endocrine Comment: Patient states he picked up his glucometer strips 2 days ago.  He states he is now re-established with new primary care provider. He reports seeing provider on 06/18/23.  Patient states he is monitoring his blood sugar 1 x per day in the morning.  He states his provider increased his metformin  daily which he is taking as prescribed.  Patient states he has cut back on sweets to almost none and only drinks diet drinks or water.  Gastrointestinal Gastrointestinal Symptoms Reported: No symptoms  reported      Genitourinary Genitourinary Symptoms Reported: No symptoms reported     Integumentary Integumentary Symptoms Reported: No symptoms reported    Musculoskeletal Musculoskelatal Symptoms Reviewed: No symptoms reported        Psychosocial Psychosocial Symptoms Reported: No symptoms reported            06/27/2023    2:04 PM  Depression screen PHQ 2/9  Decreased Interest 0  Down, Depressed, Hopeless 0  PHQ - 2 Score 0    Vitals:   06/27/23 1350 06/27/23 1352  BP: 123/61 133/70    Medications Reviewed Today     Reviewed by Evren Shankland E, RN (Registered Nurse) on 06/27/23 at 1327  Med List Status: <None>   Medication Order Taking? Sig Documenting Provider Last Dose Status Informant  acetaminophen  (TYLENOL ) 500 MG tablet [id number] No Take 1 tablet (500 mg total) by mouth every 6 (six) hours as needed. Ivery Marking, PA-C Taking Active            Med Note Burley Carpenter, LISA   Tue May 08, 2023 11:26 AM) Patient reports only taking PRN, infrequently needed for headache, etc  amLODipine  (NORVASC ) 2.5 MG tablet [id number] No Take 1 tablet (2.5 mg total) by mouth daily. Brodie Cannon, PA-C Taking Active   carvedilol  (COREG ) 12.5 MG tablet [id number] No Take 1 tablet (12.5 mg total) by mouth 2 (two) times daily with a meal. Brodie Cannon, PA-C Taking Active   furosemide  (LASIX ) 20 MG tablet [id number] No Take 1 tablet (20 mg total) by mouth daily. Brodie Cannon, PA-C Taking Active   glucose blood (ACCU-CHEK GUIDE TEST) test strip [id number]  Use as instructed to check blood sugar. Dx. E11.9 Jolanda Nation, NP  Active   lisinopril  (ZESTRIL ) 40 MG tablet [id number] No Take 1 tablet (40 mg total) by mouth daily. Brodie Cannon, PA-C Taking Active   metFORMIN  (GLUCOPHAGE ) 500 MG tablet [id number]  Take 2 tablets (1,000 mg total) by mouth 2 (two) times daily with a meal. Jolanda Nation, NP  Active   simvastatin  (ZOCOR ) 5 MG tablet [id number] No Take 1 tablet (5 mg total) by mouth daily. Brodie Cannon, PA-C Taking Active   spironolactone  (ALDACTONE ) 25 MG tablet  [id number] No Take 1 tablet (25 mg total) by mouth daily. Brodie Cannon, PA-C Taking Active             Recommendation:   PCP Follow-up  Follow Up Plan:   Telephone follow-up in 1 month with RN case manager   Verba Girt RN, BSN, CCM [place]  [facility], Population Health Case Manager Phone: [phone]

## 2023-06-27 NOTE — Patient Instructions (Signed)
 Visit Information  Thank you for taking time to visit with me today. Please don't hesitate to contact me if I can be of assistance to you before our next scheduled appointment.  Your next care management appointment is by telephone on 07/27/23 at 10 am  Telephone follow-up in 1 month with RN care manager  Please call the care guide team at [phone] if you need to cancel, schedule, or reschedule an appointment.   Please call the Suicide and Crisis Lifeline: 988 call 1-800-273-TALK (toll free, 24 hour hotline) if you are experiencing a Mental Health or Behavioral Health Crisis or need someone to talk to.  Verba Girt RN, BSN, CCM [employer], Population Health Case Manager Phone: [phone]

## 2023-07-10 ENCOUNTER — Inpatient Hospital Stay
Admission: EM | Admit: 2023-07-10 | Discharge: 2023-07-14 | DRG: 871 | Disposition: A | Discharge status: Home Health | Attending: Internal Medicine | Admitting: Internal Medicine

## 2023-07-10 ENCOUNTER — Emergency Department

## 2023-07-10 ENCOUNTER — Inpatient Hospital Stay
Admit: 2023-07-10 | Discharge: 2023-07-10 | Disposition: A | Discharge status: Home / Self Care | Attending: Pulmonary Disease

## 2023-07-10 ENCOUNTER — Other Ambulatory Visit: Payer: Self-pay

## 2023-07-10 DIAGNOSIS — Z7984 Long term (current) use of oral hypoglycemic drugs: Secondary | ICD-10-CM

## 2023-07-10 DIAGNOSIS — I452 Bifascicular block: Secondary | ICD-10-CM | POA: Diagnosis present

## 2023-07-10 DIAGNOSIS — E669 Obesity, unspecified: Secondary | ICD-10-CM | POA: Diagnosis present

## 2023-07-10 DIAGNOSIS — I272 Pulmonary hypertension, unspecified: Secondary | ICD-10-CM | POA: Diagnosis present

## 2023-07-10 DIAGNOSIS — D6959 Other secondary thrombocytopenia: Secondary | ICD-10-CM | POA: Diagnosis present

## 2023-07-10 DIAGNOSIS — E872 Acidosis, unspecified: Secondary | ICD-10-CM | POA: Diagnosis present

## 2023-07-10 DIAGNOSIS — R6520 Severe sepsis without septic shock: Secondary | ICD-10-CM

## 2023-07-10 DIAGNOSIS — E119 Type 2 diabetes mellitus without complications: Secondary | ICD-10-CM | POA: Diagnosis present

## 2023-07-10 DIAGNOSIS — I071 Rheumatic tricuspid insufficiency: Secondary | ICD-10-CM | POA: Diagnosis present

## 2023-07-10 DIAGNOSIS — R6521 Severe sepsis with septic shock: Secondary | ICD-10-CM | POA: Diagnosis not present

## 2023-07-10 DIAGNOSIS — Z79899 Other long term (current) drug therapy: Secondary | ICD-10-CM | POA: Diagnosis not present

## 2023-07-10 DIAGNOSIS — N179 Acute kidney failure, unspecified: Secondary | ICD-10-CM | POA: Diagnosis not present

## 2023-07-10 DIAGNOSIS — Z823 Family history of stroke: Secondary | ICD-10-CM

## 2023-07-10 DIAGNOSIS — R918 Other nonspecific abnormal finding of lung field: Secondary | ICD-10-CM | POA: Diagnosis not present

## 2023-07-10 DIAGNOSIS — E860 Dehydration: Secondary | ICD-10-CM | POA: Diagnosis present

## 2023-07-10 DIAGNOSIS — R531 Weakness: Principal | ICD-10-CM

## 2023-07-10 DIAGNOSIS — Z8249 Family history of ischemic heart disease and other diseases of the circulatory system: Secondary | ICD-10-CM

## 2023-07-10 DIAGNOSIS — G4733 Obstructive sleep apnea (adult) (pediatric): Secondary | ICD-10-CM | POA: Diagnosis present

## 2023-07-10 DIAGNOSIS — A419 Sepsis, unspecified organism: Secondary | ICD-10-CM | POA: Diagnosis not present

## 2023-07-10 DIAGNOSIS — E66811 Obesity, class 1: Secondary | ICD-10-CM | POA: Diagnosis present

## 2023-07-10 DIAGNOSIS — J189 Pneumonia, unspecified organism: Secondary | ICD-10-CM | POA: Insufficient documentation

## 2023-07-10 DIAGNOSIS — E7800 Pure hypercholesterolemia, unspecified: Secondary | ICD-10-CM | POA: Diagnosis present

## 2023-07-10 DIAGNOSIS — R54 Age-related physical debility: Secondary | ICD-10-CM | POA: Diagnosis present

## 2023-07-10 DIAGNOSIS — I429 Cardiomyopathy, unspecified: Secondary | ICD-10-CM | POA: Diagnosis present

## 2023-07-10 DIAGNOSIS — I4891 Unspecified atrial fibrillation: Secondary | ICD-10-CM | POA: Diagnosis present

## 2023-07-10 DIAGNOSIS — I2510 Atherosclerotic heart disease of native coronary artery without angina pectoris: Secondary | ICD-10-CM | POA: Diagnosis not present

## 2023-07-10 DIAGNOSIS — R42 Dizziness and giddiness: Secondary | ICD-10-CM | POA: Diagnosis not present

## 2023-07-10 DIAGNOSIS — I5042 Chronic combined systolic (congestive) and diastolic (congestive) heart failure: Secondary | ICD-10-CM | POA: Diagnosis present

## 2023-07-10 DIAGNOSIS — E1165 Type 2 diabetes mellitus with hyperglycemia: Secondary | ICD-10-CM

## 2023-07-10 DIAGNOSIS — D696 Thrombocytopenia, unspecified: Secondary | ICD-10-CM | POA: Diagnosis not present

## 2023-07-10 DIAGNOSIS — Z1152 Encounter for screening for COVID-19: Secondary | ICD-10-CM

## 2023-07-10 DIAGNOSIS — Z961 Presence of intraocular lens: Secondary | ICD-10-CM | POA: Diagnosis present

## 2023-07-10 DIAGNOSIS — I10 Essential (primary) hypertension: Secondary | ICD-10-CM | POA: Diagnosis present

## 2023-07-10 DIAGNOSIS — I959 Hypotension, unspecified: Secondary | ICD-10-CM | POA: Diagnosis not present

## 2023-07-10 DIAGNOSIS — H8112 Benign paroxysmal vertigo, left ear: Secondary | ICD-10-CM | POA: Diagnosis not present

## 2023-07-10 DIAGNOSIS — T68XXXA Hypothermia, initial encounter: Secondary | ICD-10-CM | POA: Insufficient documentation

## 2023-07-10 DIAGNOSIS — Z6831 Body mass index (BMI) 31.0-31.9, adult: Secondary | ICD-10-CM

## 2023-07-10 DIAGNOSIS — I4510 Unspecified right bundle-branch block: Secondary | ICD-10-CM | POA: Diagnosis present

## 2023-07-10 DIAGNOSIS — Z87891 Personal history of nicotine dependence: Secondary | ICD-10-CM

## 2023-07-10 DIAGNOSIS — I110 Hypertensive heart disease with heart failure: Secondary | ICD-10-CM | POA: Diagnosis present

## 2023-07-10 DIAGNOSIS — E785 Hyperlipidemia, unspecified: Secondary | ICD-10-CM | POA: Diagnosis present

## 2023-07-10 DIAGNOSIS — E871 Hypo-osmolality and hyponatremia: Secondary | ICD-10-CM | POA: Diagnosis present

## 2023-07-10 DIAGNOSIS — R571 Hypovolemic shock: Secondary | ICD-10-CM | POA: Diagnosis present

## 2023-07-10 DIAGNOSIS — H6121 Impacted cerumen, right ear: Secondary | ICD-10-CM | POA: Diagnosis not present

## 2023-07-10 LAB — CK: Total CK: 119 U/L (ref 49–397)

## 2023-07-10 LAB — CBC WITH DIFFERENTIAL/PLATELET
Abs Immature Granulocytes: 0.03 10*3/uL (ref 0.00–0.07)
Abs Immature Granulocytes: 0.03 10*3/uL (ref 0.00–0.07)
Basophils Absolute: 0 10*3/uL (ref 0.0–0.1)
Basophils Absolute: 0 10*3/uL (ref 0.0–0.1)
Basophils Relative: 0 %
Basophils Relative: 0 %
Eosinophils Absolute: 0 10*3/uL (ref 0.0–0.5)
Eosinophils Absolute: 0 10*3/uL (ref 0.0–0.5)
Eosinophils Relative: 0 %
Eosinophils Relative: 0 %
HCT: 37.5 % — ABNORMAL LOW (ref 39.0–52.0)
HCT: 34.7 % — ABNORMAL LOW (ref 39.0–52.0)
Hemoglobin: 12.4 g/dL — ABNORMAL LOW (ref 13.0–17.0)
Hemoglobin: 11.5 g/dL — ABNORMAL LOW (ref 13.0–17.0)
Immature Granulocytes: 1 %
Immature Granulocytes: 1 %
Lymphocytes Relative: 5 %
Lymphocytes Relative: 7 %
Lymphs Abs: 0.2 10*3/uL — ABNORMAL LOW (ref 0.7–4.0)
Lymphs Abs: 0.2 10*3/uL — ABNORMAL LOW (ref 0.7–4.0)
MCH: 27.3 pg (ref 26.0–34.0)
MCH: 27.7 pg (ref 26.0–34.0)
MCHC: 33.1 g/dL (ref 30.0–36.0)
MCHC: 33.1 g/dL (ref 30.0–36.0)
MCV: 82.6 fL (ref 80.0–100.0)
MCV: 83.6 fL (ref 80.0–100.0)
Monocytes Absolute: 0.4 10*3/uL (ref 0.1–1.0)
Monocytes Absolute: 0.3 10*3/uL (ref 0.1–1.0)
Monocytes Relative: 10 %
Monocytes Relative: 8 %
Neutro Abs: 3.7 10*3/uL (ref 1.7–7.7)
Neutro Abs: 2.7 10*3/uL (ref 1.7–7.7)
Neutrophils Relative %: 84 %
Neutrophils Relative %: 84 %
Platelets: 63 10*3/uL — ABNORMAL LOW (ref 150–400)
Platelets: 58 10*3/uL — ABNORMAL LOW (ref 150–400)
RBC: 4.54 MIL/uL (ref 4.22–5.81)
RBC: 4.15 MIL/uL — ABNORMAL LOW (ref 4.22–5.81)
RDW: 15.3 % (ref 11.5–15.5)
RDW: 15.3 % (ref 11.5–15.5)
WBC: 4.4 10*3/uL (ref 4.0–10.5)
WBC: 3.2 10*3/uL — ABNORMAL LOW (ref 4.0–10.5)
nRBC: 0 % (ref 0.0–0.2)
nRBC: 0 % (ref 0.0–0.2)

## 2023-07-10 LAB — RESPIRATORY PANEL BY PCR

## 2023-07-10 LAB — PROCALCITONIN: Procalcitonin: 2.65 ng/mL

## 2023-07-10 LAB — COMPREHENSIVE METABOLIC PANEL WITH GFR
ALT: 21 U/L (ref 0–44)
AST: 31 U/L (ref 15–41)
Albumin: 2.5 g/dL — ABNORMAL LOW (ref 3.5–5.0)
Alkaline Phosphatase: 48 U/L (ref 38–126)
Anion gap: 14 (ref 5–15)
BUN: 88 mg/dL — ABNORMAL HIGH (ref 8–23)
CO2: 20 mmol/L — ABNORMAL LOW (ref 22–32)
Calcium: 7.9 mg/dL — ABNORMAL LOW (ref 8.9–10.3)
Chloride: 98 mmol/L (ref 98–111)
Creatinine, Ser: 2.11 mg/dL — ABNORMAL HIGH (ref 0.61–1.24)
GFR, Estimated: 33 mL/min — ABNORMAL LOW (ref >60)
Glucose, Bld: 389 mg/dL — ABNORMAL HIGH (ref 70–99)
Potassium: 4.1 mmol/L (ref 3.5–5.1)
Sodium: 132 mmol/L — ABNORMAL LOW (ref 135–145)
Total Bilirubin: 1.2 mg/dL (ref 0.0–1.2)
Total Protein: 6.2 g/dL — ABNORMAL LOW (ref 6.5–8.1)

## 2023-07-10 LAB — LACTIC ACID, PLASMA
Lactic Acid, Venous: 2.4 mmol/L (ref 0.5–1.9)
Lactic Acid, Venous: 1.8 mmol/L (ref 0.5–1.9)

## 2023-07-10 LAB — URINALYSIS, ROUTINE W REFLEX MICROSCOPIC
Bilirubin Urine: NEGATIVE
Glucose, UA: >=500 mg/dL — AB
Hgb urine dipstick: NEGATIVE
Ketones, ur: NEGATIVE mg/dL
Leukocytes,Ua: NEGATIVE
Nitrite: NEGATIVE
Protein, ur: NEGATIVE mg/dL
Specific Gravity, Urine: 1.014 (ref 1.005–1.030)
pH: 5 (ref 5.0–8.0)

## 2023-07-10 LAB — MRSA NEXT GEN BY PCR, NASAL: MRSA by PCR Next Gen: NOT DETECTED

## 2023-07-10 LAB — TROPONIN I (HIGH SENSITIVITY)
Troponin I (High Sensitivity): 11 ng/L (ref <18)
Troponin I (High Sensitivity): 10 ng/L (ref <18)

## 2023-07-10 LAB — TSH: TSH: 3.004 u[IU]/mL (ref 0.350–4.500)

## 2023-07-10 LAB — BRAIN NATRIURETIC PEPTIDE: B Natriuretic Peptide: 27.5 pg/mL (ref 0.0–100.0)

## 2023-07-10 LAB — MAGNESIUM: Magnesium: 3.1 mg/dL — ABNORMAL HIGH (ref 1.7–2.4)

## 2023-07-10 LAB — RESP PANEL BY RT-PCR (RSV, FLU A&B, COVID)  RVPGX2
Influenza A by PCR: NEGATIVE
Influenza B by PCR: NEGATIVE
Resp Syncytial Virus by PCR: NEGATIVE
SARS Coronavirus 2 by RT PCR: NEGATIVE

## 2023-07-10 LAB — STREP PNEUMONIAE URINARY ANTIGEN: Strep Pneumo Urinary Antigen: NEGATIVE

## 2023-07-10 MED ORDER — ONDANSETRON HCL 4 MG/2ML IJ SOLN: 4.0000 mg | Freq: Four times a day (QID) | INTRAMUSCULAR | Status: DC | PRN

## 2023-07-10 MED ORDER — SODIUM CHLORIDE 0.9 % IV SOLN
500.0000 mg | INTRAVENOUS | Status: DC
  Administered 2023-07-10 – 2023-07-12 (×3): 500 mg via INTRAVENOUS
  Filled 2023-07-10 (×4): qty 5

## 2023-07-10 MED ORDER — LACTATED RINGERS IV BOLUS
500.0000 mL | Freq: Once | INTRAVENOUS | Status: AC
  Administered 2023-07-10: 500 mL via INTRAVENOUS

## 2023-07-10 MED ORDER — LACTATED RINGERS IV BOLUS
1000.0000 mL | Freq: Once | INTRAVENOUS | Status: AC
  Administered 2023-07-10: 1000 mL via INTRAVENOUS

## 2023-07-10 MED ORDER — INSULIN ASPART 100 UNIT/ML IJ SOLN
0.0000 [IU] | Freq: Three times a day (TID) | INTRAMUSCULAR | Status: DC
  Administered 2023-07-10 – 2023-07-11 (×2): 5 [IU] via SUBCUTANEOUS
  Administered 2023-07-11: 3 [IU] via SUBCUTANEOUS
  Administered 2023-07-11: 2 [IU] via SUBCUTANEOUS
  Administered 2023-07-12: 3 [IU] via SUBCUTANEOUS
  Administered 2023-07-12 (×2): 2 [IU] via SUBCUTANEOUS
  Administered 2023-07-13: 3 [IU] via SUBCUTANEOUS
  Administered 2023-07-13: 2 [IU] via SUBCUTANEOUS
  Administered 2023-07-13 – 2023-07-14 (×2): 3 [IU] via SUBCUTANEOUS
  Administered 2023-07-14: 2 [IU] via SUBCUTANEOUS
  Filled 2023-07-10 (×12): qty 1

## 2023-07-10 MED ORDER — METOPROLOL TARTRATE 5 MG/5ML IV SOLN: 5.0000 mg | INTRAVENOUS | Status: DC | PRN

## 2023-07-10 MED ORDER — HEPARIN SODIUM (PORCINE) 5000 UNIT/ML IJ SOLN
5000.0000 [IU] | Freq: Three times a day (TID) | INTRAMUSCULAR | Status: DC
  Administered 2023-07-11 – 2023-07-14 (×10): 5000 [IU] via SUBCUTANEOUS
  Filled 2023-07-10 (×10): qty 1

## 2023-07-10 MED ORDER — MELATONIN 5 MG PO TABS
5.0000 mg | ORAL_TABLET | Freq: Every evening | ORAL | Status: DC | PRN
  Administered 2023-07-12: 5 mg via ORAL
  Filled 2023-07-10: qty 1

## 2023-07-10 MED ORDER — INSULIN ASPART 100 UNIT/ML IJ SOLN
0.0000 [IU] | Freq: Every day | INTRAMUSCULAR | Status: DC
  Administered 2023-07-10: 3 [IU] via SUBCUTANEOUS
  Administered 2023-07-12: 4 [IU] via SUBCUTANEOUS
  Administered 2023-07-13: 2 [IU] via SUBCUTANEOUS
  Filled 2023-07-10 (×3): qty 1

## 2023-07-10 MED ORDER — ONDANSETRON HCL 4 MG PO TABS: 4.0000 mg | ORAL_TABLET | Freq: Four times a day (QID) | ORAL | Status: DC | PRN

## 2023-07-10 MED ORDER — SODIUM CHLORIDE 0.9 % IV SOLN
2.0000 g | INTRAVENOUS | Status: AC
  Administered 2023-07-10 – 2023-07-14 (×5): 2 g via INTRAVENOUS
  Filled 2023-07-10 (×5): qty 20

## 2023-07-10 MED ORDER — CARVEDILOL 6.25 MG PO TABS: 12.5000 mg | ORAL_TABLET | Freq: Two times a day (BID) | ORAL | Status: DC

## 2023-07-10 MED ORDER — ACETAMINOPHEN 650 MG RE SUPP: 650.0000 mg | Freq: Four times a day (QID) | RECTAL | Status: DC | PRN

## 2023-07-10 MED ORDER — MIDODRINE HCL 5 MG PO TABS
10.0000 mg | ORAL_TABLET | Freq: Once | ORAL | Status: AC
  Administered 2023-07-10: 10 mg via ORAL
  Filled 2023-07-10: qty 2

## 2023-07-10 MED ORDER — SODIUM CHLORIDE 0.9 % IV BOLUS
500.0000 mL | Freq: Once | INTRAVENOUS | Status: AC
  Administered 2023-07-10: 500 mL via INTRAVENOUS

## 2023-07-10 MED ORDER — HYDRALAZINE HCL 20 MG/ML IJ SOLN: 5.0000 mg | Freq: Four times a day (QID) | INTRAMUSCULAR | Status: DC | PRN

## 2023-07-10 MED ORDER — ACETAMINOPHEN 325 MG PO TABS
650.0000 mg | ORAL_TABLET | Freq: Four times a day (QID) | ORAL | Status: DC | PRN
  Administered 2023-07-13: 650 mg via ORAL
  Filled 2023-07-10: qty 2

## 2023-07-10 MED ORDER — ALBUMIN HUMAN 25 % IV SOLN
25.0000 g | Freq: Once | INTRAVENOUS | Status: AC
  Administered 2023-07-10: 25 g via INTRAVENOUS
  Filled 2023-07-10: qty 100

## 2023-07-10 MED ORDER — LACTATED RINGERS IV SOLN: INTRAVENOUS | Status: AC

## 2023-07-10 MED ORDER — LACTATED RINGERS IV SOLN
150.0000 mL/h | INTRAVENOUS | Status: DC
Start: 2023-07-10 — End: 2023-07-10
  Administered 2023-07-10: 150 mL/h via INTRAVENOUS

## 2023-07-10 MED ORDER — NOREPINEPHRINE 4 MG/250ML-% IV SOLN
0.0000 ug/min | INTRAVENOUS | Status: DC
  Administered 2023-07-10: 2 ug/min via INTRAVENOUS
  Filled 2023-07-10 (×2): qty 250

## 2023-07-10 MED ORDER — SODIUM CHLORIDE 0.9 % IV SOLN: 250.0000 mL | INTRAVENOUS | Status: AC

## 2023-07-10 NOTE — Progress Notes (Signed)
 Patient ordered on CPAP at night. Patient has sleep apnea but has never worn a CPAP at home.

## 2023-07-10 NOTE — ED Notes (Addendum)
 Patient placed on bair hugger due to rectal temperature drop patient still slert and oriented.

## 2023-07-10 NOTE — Assessment & Plan Note (Signed)
 Non-insulin-dependent diabetes mellitus Home metformin  will not be resumed on admission Insulin SSI with at bedtime coverage ordered

## 2023-07-10 NOTE — ED Triage Notes (Signed)
 Family states that he has been in the bed since Wednesday, weak, dizzy, family uncertain if he has eaten since Wednesday, falling asleep, normally is very on the go and a busy person, daughter states when he walks he is "off"  Hypotensive in triage at 63/44

## 2023-07-10 NOTE — Assessment & Plan Note (Signed)
 CPAP nightly ordered

## 2023-07-10 NOTE — ED Notes (Addendum)
 Patient was able to transfer into the stretcher with 2 assist help. Patient cool to touch when brought back from triage. Rectal temperature obtained 96.7. Multiple warm blankets provided to/over patient. Room temperature increased. Patient resting in bed with daughter at bedside. Patient alert to speech and touch. Patient able to follow commands but falls back asleep.

## 2023-07-10 NOTE — H&P (Addendum)
 Addendum: Received message from nursing that patient's blood pressure on recheck is 73/47 with a MAP of 57.  # Severe sepsis with septic shock, not responsive to IV fluid # Hypothermia - Patient is status post sepsis bolus and current on LR infusion 150 mL/h - Midodrine 10 mg p.o. one-time dose ordered - Bed changed from PCU to stepdown - PCCM has been consulted, Dr. Darnelle Elders via secure chat for consideration of vasopressor support - Discussed with nursing   History and Physical   Jesse Torres ZOX:[id number] DOB: [birth year] DOA: 07/10/2023  PCP: Jolanda Nation, NP  Outpatient Specialists: Dr. Alvenia Aus, cardiology Patient coming from: home via EMS  I have personally briefly reviewed patient's old medical records in [facility] EMR.  Chief Concern: Poor p.o. intake, dizziness, weakness  HPI: Mr. Jesse Torres is a 73 year old male with hypertension, non-insulin-dependent diabetes mellitus, hyperlipidemia, pulmonary hypertension, heart failure preserved ejection fraction, obesity, no prior CKD who presents emergency department for chief concerns of weakness, dizziness, and poor p.o. intake.  Vitals in the ED showed t 96.7, rr 16, heart rate of 80, blood pressure initially 63/44, responded to IV fluid and improved to 104/58, SpO2 98% on room air.  Serum sodium is 132, potassium 4.1, chloride 98, bicarb 20, BUN of 80, serum creatinine of 2.11, EGFR of 33, nonfasting blood glucose 389, WBC 4.4, hemoglobin 12.4, platelets of 63.  Lactic acid was 2.4.  BNP and-C troponin were within normal limits.  Chest x-ray has been ordered and pending radiology read at this time.  ED treatment: LR 2 L bolus ------------------------------------------ At bedside, patient is able to tell me his first name, age, location, current calendar year.  Per daughter and son at bedside, patient lives at home on his own.  He was supposed to have an ENT appointment and did go to the ENT appointment today when the ENT told  him that he did not have an ear infection however patient looked unwell and needs to come to the emergency department for further evaluation.  Family took patient to the emergency department.  Patient denies cough, congestion, nausea, vomiting, chest pain.  Per daughter at bedside, patient has been unwell since Friday.  They noticed that he has not been eating and drinking very well and has been weaker than normal.  Social history: He lives at home on his own.  He is a former tobacco user, quitting smoking about 25 years ago.  He denies EtOH and recreational drug use.  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  ROS: Constitutional: no weight change, no fever ENT/Mouth: no sore throat, no rhinorrhea Eyes: no eye pain, no vision changes Cardiovascular: no chest pain, no dyspnea,  no edema, no palpitations Respiratory: no cough, no sputum, no wheezing Gastrointestinal: no nausea, no vomiting, no diarrhea, no constipation Genitourinary: no urinary incontinence, no dysuria, no hematuria Musculoskeletal: no arthralgias, no myalgias Skin: no skin lesions, no pruritus, Neuro: + weakness, no loss of consciousness, no syncope Psych: no anxiety, no depression, + decrease appetite Heme/Lymph: no bruising, no bleeding  ED Course: Discussed with EDP, patient requiring hospitalization for chief concerns of AKI and hypotension.  Assessment/Plan  Principal Problem:   Severe sepsis with acute organ dysfunction [facility]) Active Problems:   Essential hypertension   Sleep apnea, obstructive   Obesity   Pulmonary hypertension (HCC)   RBBB (right bundle branch block)   Dyslipidemia   CAD (coronary artery disease)   Pure hypercholesterolemia   Type 2 diabetes mellitus without  complication (HCC)   AKI (acute kidney injury) (HCC)   Hypothermia   CAP (community acquired pneumonia)   Assessment and Plan:  * Severe sepsis with acute organ dysfunction (HCC) Check procalcitonin Elevated lactic acid  will follow second lactic acid Blood cultures x 2 are in process Check procalcitonin on admission Initiated azithromycin 500 mg IV, ceftriaxone 2 g IV daily to complete a 5-day course Maintain goal MAP > 65 Nursing order placed to recheck patient's temperature  CAP (community acquired pneumonia) Azithromycin 500 mg IV daily, ceftriaxone 2 g IV daily to complete a 5-day course ordered on admission LR infusion at 150 mL/h  Hypothermia Suspect secondary to community-acquired pneumonia Will check a TSH on admission Continue Bair hugger Lactic acid is improved which is reassuring  AKI (acute kidney injury) (HCC) Status post LR 2 L bolus per EDP On admission ordered sodium chloride 500 mL bolus followed by LR infusion at 150 mL/h, 20 hours ordered Recheck BMP in the a.m.  Type 2 diabetes mellitus without complication (HCC) Non-insulin-dependent diabetes mellitus Home metformin  will not be resumed on admission Insulin SSI with at bedtime coverage ordered  Sleep apnea, obstructive CPAP nightly ordered  Essential hypertension Patient initially low normotensive Home amlodipine , Coreg  12.5 mg p.o. twice daily, furosemide  20 mg daily, lisinopril  20 mg daily, spironolactone  25 mg daily will not be resumed on admission Hydralazine  5 mg IV every 6 hours as needed for SBP greater than 170, 5 days ordered  Chart reviewed.   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   Chronic systolic CHF (congestive heart failure) (HCC)    a.  Echocardiogram (02/05/2013): Mild LVH, EF 25-30%, diffuse HK, mild MR, mild LAE, mild RVE, moderately reduced RVSF, mild RAE, PASP 64, trivial effusion.;  b.  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 His smoking use included cigarettes. He has never used smokeless tobacco. He reports that he does not drink alcohol and does not use drugs.  No Known Allergies Family History  Problem Relation Age of Onset   Heart attack Mother    Stroke Father    Family history: Family history reviewed and not pertinent.  Prior to Admission medications   Medication Sig Start Date End Date Taking? Authorizing Provider  acetaminophen  (TYLENOL ) 500 MG tablet Take 1 tablet (500 mg total) by  mouth every 6 (six) hours as needed. 07/18/19   Fawze, Mina A, PA-C  amLODipine  (NORVASC ) 2.5 MG tablet Take 1 tablet (2.5 mg total) by mouth daily. 05/02/23   Brodie Cannon, PA-C  carvedilol  (COREG ) 12.5 MG tablet Take 1 tablet (12.5 mg total) by mouth 2 (two) times daily with a meal. 05/02/23   Gildardo Labrador L, PA-C  furosemide  (LASIX ) 20 MG tablet Take 1 tablet (20 mg total) by mouth daily. 05/02/23   Gildardo Labrador L, PA-C  glucose blood (ACCU-CHEK GUIDE TEST) test strip Use as instructed to check blood sugar. Dx. E11.9 06/18/23   Jolanda Nation, NP  lisinopril  (ZESTRIL ) 40 MG tablet Take 1 tablet (40 mg total) by mouth daily. 05/02/23 07/31/23  Brodie Cannon, PA-C  metFORMIN  (GLUCOPHAGE ) 500 MG tablet Take 2 tablets (1,000 mg total) by mouth 2 (two) times daily with a meal. 06/18/23   Jolanda Nation, NP  simvastatin  (ZOCOR ) 5 MG tablet Take 1 tablet (5 mg total) by mouth daily. 05/02/23   Brodie Cannon, PA-C  spironolactone  (ALDACTONE ) 25 MG tablet Take 1 tablet (25 mg total) by mouth daily. 05/02/23   Brodie Cannon, PA-C    Physical Exam: Vitals:   07/10/23 1200 07/10/23 1227 07/10/23 1300 07/10/23 1452  BP: (!) 93/58     Pulse: 62     Resp: 15     Temp:  (!) 96 F (35.6 C) (!) 95.1 F (35.1 C) (!) 95.2 F (35.1 C)  TempSrc:  Rectal Rectal Rectal  SpO2: 97%     Weight:   88.5 kg   Height:       Constitutional: appears frail, acutely ill Eyes: PERRL, lids and conjunctivae normal ENMT: Mucous membranes are moist. Posterior pharynx clear of any exudate or lesions. Age-appropriate dentition. Hearing appropriate Neck: normal, supple, no masses, no thyromegaly Respiratory: Decreased lung sounds at the right lower lobe, no wheezing, no crackles. Normal respiratory effort. No accessory muscle use.  Cardiovascular: Regular rate and rhythm, no murmurs / rubs / gallops. No extremity edema. 2+ pedal pulses. No carotid bruits.  Abdomen: Obese woman, no tenderness, no masses palpated, no hepatosplenomegaly. Bowel sounds positive.  Musculoskeletal: no clubbing / cyanosis. No joint deformity upper and lower extremities. Good ROM, no contractures, no atrophy. Normal muscle tone.  Skin: no rashes, lesions, ulcers. No induration Neurologic: Sensation intact. Strength 5/5 in all 4.  Psychiatric: Normal judgment and insight. Alert and oriented x 3. Normal mood.   EKG: independently reviewed, showing sinus rhythm with rate of 60, QTc 498, right bundle branch block  Chest x-ray on Admission: I personally reviewed and I agree with  radiologist reading as below.  DG Chest Portable 1 View Result Date: 07/10/2023 CLINICAL DATA:  Weakness. EXAM: PORTABLE CHEST 1 VIEW COMPARISON:  Chest radiograph dated 02/04/2013. FINDINGS: Right mid to lower lung field opacity concerning for developing infiltrate. The left lung is clear. No large pleural effusion or pneumothorax. Stable cardiac silhouette. No acute osseous pathology. IMPRESSION: Right mid to lower lung field opacity concerning for developing infiltrate. Electronically Signed   By: Angus Bark M.D.   On: 07/10/2023 14:45   Labs on Admission: I have personally reviewed following labs  CBC: Recent Labs  Lab 07/10/23 1109  WBC 4.4  NEUTROABS 3.7  HGB 12.4*  HCT 37.5*  MCV 82.6  PLT 63*   Basic Metabolic Panel: Recent Labs  Lab 07/10/23 1109  NA 132*  K 4.1  CL 98  CO2 20*  GLUCOSE 389*  BUN 88*  CREATININE 2.11*  CALCIUM 7.9*  MG 3.1*   GFR: Estimated Creatinine Clearance: 33 mL/min (A) (by C-G formula based on SCr of 2.11 mg/dL (H)). Liver Function Tests: Recent Labs  Lab 07/10/23 1109  AST 31  ALT 21  ALKPHOS 48  BILITOT 1.2  PROT 6.2*  ALBUMIN 2.5*   CRITICAL CARE Performed by: Dr. Reinhold Carbine  Total critical care time: 32 minutes  Critical care time was exclusive of separately billable procedures and treating other patients.  Critical care was necessary to treat or prevent imminent or life-threatening deterioration.  Critical care was time spent personally by me on the following activities: development of treatment plan with patient and family (daughter and son) as well as nursing, discussions with consultants, evaluation of patient's response to treatment, examination of patient, obtaining history from patient or surrogate, ordering and performing treatments and interventions, ordering and review of laboratory studies, ordering and review of radiographic studies, pulse oximetry and re-evaluation of patient's condition.  This document was  prepared using Dragon Voice Recognition software and may include unintentional dictation errors.  Dr. Reinhold Carbine [facility]  If 7PM-7AM, please contact overnight-coverage provider If 7AM-7PM, please contact day attending provider [email or web address]  07/10/2023, 3:02 PM

## 2023-07-10 NOTE — ED Notes (Signed)
 Patient transferred to the floor before 2nd lactic and troponin could be drawn. Patient transferred on the bair hugger.

## 2023-07-10 NOTE — Consult Note (Signed)
 NAME:  Jesse Torres, MRN:  [id number], DOB:  1950/11/20, LOS: 0 ADMISSION DATE:  07/10/2023, CONSULTATION DATE:  07/10/2023 REFERRING MD:  Dr. Reinhold Carbine, CHIEF COMPLAINT:  Poor PO intake, dizziness, weakness   Brief Pt Description / Synopsis:  73 y.o. male with PMHx significant for Hypertension induced Cardiomyopathy with recovered EF, HTN, OSA on CPAP, Pulmonary Hypertension, and chronic thrombocytopenia who is admitted with Severe Sepsis with Septic Shock due to Community Acquired Pneumonia, along with Atrial Fibrillation and AKI.  History of Present Illness:  Jesse Torres is a 73 year old male with hypertension, Hypertension induced Cardiomyopathy with recovered EF, non-insulin-dependent diabetes mellitus, hyperlipidemia, pulmonary hypertension, obesity who presents emergency department for chief concerns of weakness, dizziness, and poor p.o. intake.  Family reports he has been unwell since Friday, and has not been eating and drinking very well, and has been weaker than normal.  Family also report that he went to an ENT appointment today and was told he did not have an ear infection, but was recommended he come to the ED as he looked unwell.  ED Course: Initial Vital Signs: Temperature 96 F, RR 16, Pulse 80, BP 63/44,, SpO2 97% on room air Significant Labs: Sodium 132, bicarb 20, glucose 389, BUN 88, Creatinine 2.1, BNP 27.5, HS Troponin 11, Lactic acid 2.4, Procalcitonin  2.65, WBC 4.4, Hgb 12.4, Hct 37.5, platelets 63, TSH 3 Imaging Chest X-ray>>FINDINGS: Right mid to lower lung field opacity concerning for developing infiltrate. The left lung is clear. No large pleural effusion or pneumothorax. Stable cardiac silhouette. No acute osseous pathology. Medications Administered: 2.5 L IV fluid boluses, IV Azithromycin & Ceftriaxone  TRH asked to admit for further workup and treatment.  After arrival to Progressive unit, pt hypotensive despite IV fluids concerning for developing septic shock.  He  is being transferred to [facility] with PCCM consultation for possible vasopressors.  Please see "Significant Hospital Events" section below for full detailed hospital course.   Pertinent  Medical History   Past Medical History:  Diagnosis Date   Cardiomyopathy (HCC)    probable HTN cardiomyopathy;  Lexiscan  Myoview (02/07/2013): EF 34%, old small apical infarct, no ischemia, moderate apical hypokinesis.   Chronic systolic CHF (congestive heart failure) (HCC)    a. Echocardiogram (02/05/2013): Mild LVH, EF 25-30%, diffuse HK, mild MR, mild LAE, mild RVE, moderately reduced RVSF, mild RAE, PASP 64, trivial effusion.;  b.  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   Vitrectomy 02-18-2020   Vitreous membranes and strands, left 02/19/2020   Contributing to pseudophakic CME in a scleral fixated posterior chamber lens  Vitrectomy 02-18-20, removal of cortical vitreous    Micro Data:  5/27: Blood cultures x2>> 5/27: Strep Pneumo & Legionella urinary antigens>> 5/27: COVID/FLU/RSV PCR>> 5/27: RVP>> 5/27: Sputum>>  Antimicrobials:   Anti-infectives (From admission, onward)    Start     Dose/Rate Route Frequency Ordered Stop   07/10/23 1300  cefTRIAXone (ROCEPHIN) 2 g in sodium chloride 0.9 % 100 mL IVPB        2 g 200 mL/hr over  30 Minutes Intravenous Every 24 hours 07/10/23 1240 07/15/23 1259   07/10/23 1300  azithromycin (ZITHROMAX) 500 mg in sodium chloride 0.9 % 250 mL IVPB        500 mg 250 mL/hr over 60 Minutes Intravenous Every 24 hours 07/10/23 1240 07/15/23 1259       Significant Hospital Events: Including procedures, antibiotic  start and stop dates in addition to other pertinent events   5/27: Presented to ED with poor po intake, weakness, dizziness.  Admitted by TRH for severe sepsis due to CAP.  Concern for developing shock, transfer to [facility] with PCCM consultation for initiation of peripheral vasopressors.  Interim History / Subjective:  As outlined above under "Significant Hospital Events" section  Objective    Blood pressure (!) 73/47, pulse 78, temperature (!) 95.2 F (35.1 C), temperature source Rectal, resp. rate 15, height 5\' 6"  (1.676 m), weight 88.5 kg, SpO2 97%.       No intake or output data in the 24 hours ending 07/10/23 1545 Filed Weights   07/10/23 1102 07/10/23 1300  Weight: 88.5 kg 88.5 kg    Examination: General: Acutely ill appearing male, laying in bed, on room air, in NAD HENT: Atraumatic, normocephalic, neck supple, no JVD, dry MM Lungs: Clear breath sounds throughout, even, nonlabored, normal effort Cardiovascular: Irregularly irregular rhythm, rate controlled, no M/R/G Abdomen: Obese, soft, nontender, nondistended, no guarding or rebound tenderness, + hernia Extremities: Normal bulk and tone, no deformities, 1+ edema BLE, extremities are warm, no cyanosis or mottling Neuro: Awake and alert, oriented x3, moves all extremities to commands, no focal deficits, speech clear, pupils PERRL GU: External male catheter in place  Resolved Hospital Problem list     Assessment & Plan:   #Shock: Hypovolemic + Septic #Atrial Fibrillation, rate currently controlled #Hypertension induced Cardiomyopathy with recovered EF PMHx: HTN, HLD Echocardiogram 09/11/22: LVEF 60-65%, grade I DD, RV systolic function normal, RV size mildly enlarged, mildly elevated pulmonary artery systolic pressure (41.7 mmHg), mild to moderate Tricuspid regurgitation -Continuous cardiac monitoring -Maintain MAP >65 -Gentle IV fluids (received 30 cc/kg fluids per sepsis protocol) -Vasopressors as needed to maintain  MAP goal -Lactic acid is normalized -HS Troponin is negative x2 -Check Cortisol -Repeat Echocardiogram ordered and pending -Will need to consider Heparin  gtt for [facility], however hold for now given acute thrombocytopenia  #Severe Sepsis due to ... #Community Acquired Pneumonia -Monitor fever curve -Trend WBC's & Procalcitonin -Follow cultures as above -Continue empiric Azithromycin and Ceftriaxone pending cultures & sensitivities  #Hypothermia, suspect due to sepsis -Warming blanket -TSH normal, Cortisol is pending  #Acute Kidney Injury #Anion Gap Metabolic Acidosis due to Lactic Acidosis #Mild Hyponatremia, suspect Hypotonic Hypovolemic due to dehydration -Monitor I&O's / urinary output -Follow BMP -Ensure adequate renal perfusion -Avoid nephrotoxic agents as able -Replace electrolytes as indicated ~ Pharmacy following for assistance with electrolyte replacement -IV Fluids -Check CK  #Thrombocytopenia, suspect in setting of sepsis PMHx: Thrombocytopenia -Monitor for S/Sx of bleeding -Trend CBC -Heparin  SQ for VTE Prophylaxis  -Transfuse for Hgb <7 -Transfuse platelets for Platelet count <10 K; < 50 k with active bleeding; <100 K with Neurosurgical procedures -Add on Differential and Peripheral Smear  #OSA, uses CPAP at home #Pulmonary Hypertension -Supplemental O2 as needed to maintain O2 sats >92% -BiPAP if needed, wean as tolerated ~ CPAP qhs -Follow intermittent Chest X-ray & ABG as needed -Bronchodilators prn -ABX as above -Pulmonary toilet as able  #Diabetes Mellitus Type II -CBG's q4h; Target range of 140 to 180 -  SSI -Follow ICU Hypo/Hyperglycemia protocol    Best Practice (right click and "Reselect all SmartList Selections" daily)   Diet/type: Regular consistency (see orders) DVT prophylaxis: prophylactic heparin   GI prophylaxis: PPI Lines: N/A Foley:  N/A Code Status:  full code Last date of multidisciplinary goals of care discussion [N/A]  5/27: Pt  and family updated at bedside on plan of care.  Labs   CBC: Recent Labs  Lab 07/10/23 1109  WBC 4.4  NEUTROABS 3.7  HGB 12.4*  HCT 37.5*  MCV 82.6  PLT 63*    Basic Metabolic Panel: Recent Labs  Lab 07/10/23 1109  NA 132*  K 4.1  CL 98  CO2 20*  GLUCOSE 389*  BUN 88*  CREATININE 2.11*  CALCIUM 7.9*  MG 3.1*   GFR: Estimated Creatinine Clearance: 33 mL/min (A) (by C-G formula based on SCr of 2.11 mg/dL (H)). Recent Labs  Lab 07/10/23 1109 07/10/23 1115 07/10/23 1407  PROCALCITON 2.65  --   --   WBC 4.4  --   --   LATICACIDVEN  --  2.4* 1.8    Liver Function Tests: Recent Labs  Lab 07/10/23 1109  AST 31  ALT 21  ALKPHOS 48  BILITOT 1.2  PROT 6.2*  ALBUMIN 2.5*   No results for input(s): "LIPASE", "AMYLASE" in the last 168 hours. No results for input(s): "AMMONIA" in the last 168 hours.  ABG No results found for: "PHART", "PCO2ART", "PO2ART", "HCO3", "TCO2", "ACIDBASEDEF", "O2SAT"   Coagulation Profile: No results for input(s): "INR", "PROTIME" in the last 168 hours.  Cardiac Enzymes: No results for input(s): "CKTOTAL", "CKMB", "CKMBINDEX", "TROPONINI" in the last 168 hours.  HbA1C: Hgb A1c MFr Bld  Date/Time Value Ref Range Status  05/02/2023 02:01 PM 9.6 (H) 4.8 - 5.6 % Final    Comment:             Prediabetes: 5.7 - 6.4          Diabetes: >6.4          Glycemic control for adults with diabetes: <7.0   08/14/2022 09:58 AM 8.4 (H) 4.8 - 5.6 % Final    Comment:             Prediabetes: 5.7 - 6.4          Diabetes: >6.4          Glycemic control for adults with diabetes: <7.0     CBG: No results for input(s): "GLUCAP" in the last 168 hours.  Review of Systems:   Positives in BOLD: Gen: Denies fever, chills, weight change, fatigue/malaise, night sweats, poor PO intake HEENT: Denies blurred vision, double vision, hearing loss, tinnitus, sinus congestion, rhinorrhea, sore throat, neck stiffness, dysphagia PULM: Denies shortness of  breath, cough, sputum production, hemoptysis, wheezing CV: Denies chest pain, edema, orthopnea, paroxysmal nocturnal dyspnea, palpitations GI: Denies abdominal pain, nausea, vomiting, diarrhea, hematochezia, melena, constipation, change in bowel habits GU: Denies dysuria, hematuria, polyuria, oliguria, urethral discharge Endocrine: Denies hot or cold intolerance, polyuria, polyphagia or appetite change Derm: Denies rash, dry skin, scaling or peeling skin change Heme: Denies easy bruising, bleeding, bleeding gums Neuro: Denies headache, numbness, generalized weakness, slurred speech, loss of memory or consciousness   Past Medical History:  He,  has a past medical history of Cardiomyopathy (HCC), Chronic systolic CHF (congestive heart failure) (HCC), Ejection fraction < 50%, History of chicken pox, Hypertension, Posterior vitreous detachment, left eye (01/26/2020), Pulmonary HTN (HCC), Snoring, Thigh shingles (01/04/2015), Thrombocytopenia (HCC), Vitreomacular adhesion of  left eye (12/08/2019), and Vitreous membranes and strands, left (02/19/2020).   Surgical History:   Past Surgical History:  Procedure Laterality Date   Cataract surgery       Social History:   reports that he quit smoking about 28 years ago. His smoking use included cigarettes. He has never used smokeless tobacco. He reports that he does not drink alcohol and does not use drugs.   Family History:  His family history includes Heart attack in his mother; Stroke in his father.   Allergies No Known Allergies   Home Medications  Prior to Admission medications   Medication Sig Start Date End Date Taking? Authorizing Provider  acetaminophen  (TYLENOL ) 500 MG tablet Take 1 tablet (500 mg total) by mouth every 6 (six) hours as needed. 07/18/19   Fawze, Mina A, PA-C  amLODipine  (NORVASC ) 2.5 MG tablet Take 1 tablet (2.5 mg total) by mouth daily. 05/02/23   Brodie Cannon, PA-C  carvedilol  (COREG ) 12.5 MG tablet Take 1 tablet  (12.5 mg total) by mouth 2 (two) times daily with a meal. 05/02/23   Gildardo Labrador L, PA-C  furosemide  (LASIX ) 20 MG tablet Take 1 tablet (20 mg total) by mouth daily. 05/02/23   Gildardo Labrador L, PA-C  glucose blood (ACCU-CHEK GUIDE TEST) test strip Use as instructed to check blood sugar. Dx. E11.9 06/18/23   Jolanda Nation, NP  lisinopril  (ZESTRIL ) 40 MG tablet Take 1 tablet (40 mg total) by mouth daily. 05/02/23 07/31/23  Gildardo Labrador L, PA-C  metFORMIN  (GLUCOPHAGE ) 500 MG tablet Take 2 tablets (1,000 mg total) by mouth 2 (two) times daily with a meal. 06/18/23   Jolanda Nation, NP  simvastatin  (ZOCOR ) 5 MG tablet Take 1 tablet (5 mg total) by mouth daily. 05/02/23   Brodie Cannon, PA-C  spironolactone  (ALDACTONE ) 25 MG tablet Take 1 tablet (25 mg total) by mouth daily. 05/02/23   Brodie Cannon, PA-C     Critical care time: 50 minutes     Cherylann Corpus, AGACNP-BC [facility] Prefer epic messenger for cross cover needs If after hours, please call E-link

## 2023-07-10 NOTE — Assessment & Plan Note (Signed)
 Azithromycin  500 mg IV daily, ceftriaxone  2 g IV daily to complete a 5-day course ordered on admission LR infusion at 150 mL/h

## 2023-07-10 NOTE — Assessment & Plan Note (Addendum)
 Check procalcitonin Elevated lactic acid will follow second lactic acid Blood cultures x 2 are in process Check procalcitonin on admission Initiated azithromycin  500 mg IV, ceftriaxone  2 g IV daily to complete a 5-day course Maintain goal MAP > 65 Nursing order placed to recheck patient's temperature

## 2023-07-10 NOTE — ED Provider Notes (Signed)
 [facility] Provider Note    Event Date/Time   First MD Initiated Contact with Patient 07/10/23 1055     (approximate)   History   Chief Complaint Weakness and Hypotension   HPI  Jesse Torres is a 73 y.o. male with past medical history of hypertension, hyperlipidemia, diabetes, CAD, CHF, and pulmonary hypertension who presents to the ED complaining of generalized weakness.  Per daughter at bedside, patient has been "down" for the past 6 days.  He has not wanted to get out of bed or his chair during this time, has seemed very lethargic, and has not been eating or drinking much.  He did complain of some dizziness and daughter states that he was unsteady on his feet when attempting to walk.  He went to be seen by ENT today for possible vertigo, noted to be hypotensive there and referred to the ED for further evaluation.  Patient denies any chest pain, shortness of breath, fever, cough, nausea, vomiting, diarrhea, or dysuria.     Physical Exam   Triage Vital Signs: ED Triage Vitals  Encounter Vitals Group     BP      Systolic BP Percentile      Diastolic BP Percentile      Pulse      Resp      Temp      Temp src      SpO2      Weight      Height      Head Circumference      Peak Flow      Pain Score      Pain Loc      Pain Education      Exclude from Growth Chart     Most recent vital signs: Vitals:   07/10/23 1200 07/10/23 1227  BP: (!) 93/58   Pulse: 62   Resp: 15   Temp:  (!) 96 F (35.6 C)  SpO2: 97%     Constitutional: Somnolent but arousable to voice. Eyes: Conjunctivae are normal. Head: Atraumatic. Nose: No congestion/rhinnorhea. Mouth/Throat: Mucous membranes are moist.  Cardiovascular: Normal rate, regular rhythm. Grossly normal heart sounds.  2+ radial pulses bilaterally. Respiratory: Normal respiratory effort.  No retractions. Lungs CTAB. Gastrointestinal: Soft and nontender. No distention. Musculoskeletal: No lower  extremity tenderness, 1+ pitting edema to knees bilaterally. Neurologic:  Normal speech and language.  Global weakness noted with no gross focal neurologic deficits appreciated.    ED Results / Procedures / Treatments   Labs (all labs ordered are listed, but only abnormal results are displayed) Labs Reviewed  CBC WITH DIFFERENTIAL/PLATELET - Abnormal; Notable for the following components:      Result Value   Hemoglobin 12.4 (*)    HCT 37.5 (*)    Platelets 63 (*)    Lymphs Abs 0.2 (*)    All other components within normal limits  COMPREHENSIVE METABOLIC PANEL WITH GFR - Abnormal; Notable for the following components:   Sodium 132 (*)    CO2 20 (*)    Glucose, Bld 389 (*)    BUN 88 (*)    Creatinine, Ser 2.11 (*)    Calcium 7.9 (*)    Total Protein 6.2 (*)    Albumin 2.5 (*)    GFR, Estimated 33 (*)    All other components within normal limits  MAGNESIUM - Abnormal; Notable for the following components:   Magnesium 3.1 (*)    All other components within normal  limits  LACTIC ACID, PLASMA - Abnormal; Notable for the following components:   Lactic Acid, Venous 2.4 (*)    All other components within normal limits  CULTURE, BLOOD (ROUTINE X 2)  CULTURE, BLOOD (ROUTINE X 2)  BRAIN NATRIURETIC PEPTIDE  URINALYSIS, ROUTINE W REFLEX MICROSCOPIC  LACTIC ACID, PLASMA  TROPONIN I (HIGH SENSITIVITY)  TROPONIN I (HIGH SENSITIVITY)     EKG  ED ECG REPORT I, Twilla Galea, the attending physician, personally viewed and interpreted this ECG.   Date: 07/10/2023  EKG Time: 10:55  Rate: 60  Rhythm: normal sinus rhythm  Axis: LAD  Intervals:right bundle branch block and left anterior fascicular block  ST&T Change: None  RADIOLOGY Chest x-ray reviewed and interpreted by me with no infiltrate, edema, or effusion.  PROCEDURES:  Critical Care performed: No  Procedures   MEDICATIONS ORDERED IN ED: Medications  lactated ringers bolus 1,000 mL (0 mLs Intravenous Stopped  07/10/23 1153)  lactated ringers bolus 1,000 mL (1,000 mLs Intravenous New Bag/Given 07/10/23 1231)     IMPRESSION / MDM / ASSESSMENT AND PLAN / ED COURSE  I reviewed the triage vital signs and the nursing notes.                              73 y.o. male with past medical history of hypertension, hyperlipidemia, diabetes, CAD, CHF, and pulmonary hypertension who presents to the ED with generalized weakness over the past 6 days, noted to be hypotensive on outpatient visit.  Patient's presentation is most consistent with acute presentation with potential threat to life or bodily function.  Differential diagnosis includes, but is not limited to, ACS, arrhythmia, sepsis, UTI, pneumonia, cardiogenic shock, medication effect, dehydration, electrolyte abnormality, AKI.  Patient ill-appearing but in no acute distress, vital signs remarkable for hypotension but otherwise reassuring.  EKG shows no evidence of arrhythmia or ischemia, cardiac monitor does show frequent PVCs.  We will initiate septic workup with blood cultures and lactic acid, give IV fluid bolus but hold off on broad-spectrum antibiotics at this time given no clear source of infection.  Cardiogenic process also considered, troponin pending at this time.  Patient's most recent echocardiogram did show normal EF.  Labs remarkable for AKI with hyperglycemia but no significant electrolyte abnormality noted and no signs of DKA.  LFTs are unremarkable, no significant anemia or leukocytosis noted, and troponin within normal limits.  Lactic acid mildly elevated but low suspicion for sepsis at this time, BP improving following IV fluids.  Case discussed with hospitalist for admission.      FINAL CLINICAL IMPRESSION(S) / ED DIAGNOSES   Final diagnoses:  Generalized weakness  AKI (acute kidney injury) (HCC)  Hypotension, unspecified hypotension type     Rx / DC Orders   ED Discharge Orders     None        Note:  This document was  prepared using Dragon voice recognition software and may include unintentional dictation errors.   Twilla Galea, MD 07/10/23 1240

## 2023-07-10 NOTE — Assessment & Plan Note (Signed)
 Patient initially low normotensive Home amlodipine , Coreg  12.5 mg p.o. twice daily, furosemide  20 mg daily, lisinopril  20 mg daily, spironolactone  25 mg daily will not be resumed on admission Hydralazine  5 mg IV every 6 hours as needed for SBP greater than 170, 5 days ordered

## 2023-07-10 NOTE — ED Notes (Signed)
 Patient stated he was unable to provide UA sample at this time. Urinal placed at bedside.

## 2023-07-10 NOTE — Progress Notes (Signed)
 CODE SEPSIS - PHARMACY COMMUNICATION  **Broad Spectrum Antibiotics should be administered within 1 hour of Sepsis diagnosis**  Time Code Sepsis Called/Page Received: 1240  Antibiotics Ordered: azithromycin and ceftriaxone  Time of 1st antibiotic administration: 1519  Additional action taken by pharmacy: Called RN in ED to get antibiotics started but patient had already been transferred out of ED to the floor; called RN on floor to get antibiotics administered and had to re-dispense antibiotics and send to the unit.  If necessary, Name of Provider/Nurse Contacted: None    Ananias Balls ,PharmD Clinical Pharmacist  07/10/2023  1:03 PM

## 2023-07-10 NOTE — Assessment & Plan Note (Addendum)
 Suspect secondary to community-acquired pneumonia Will check a TSH on admission Continue Bair hugger Lactic acid is improved which is reassuring

## 2023-07-10 NOTE — Assessment & Plan Note (Addendum)
 Status post LR 2 L bolus per EDP On admission ordered sodium chloride 500 mL bolus followed by LR infusion at 150 mL/h, 20 hours ordered Recheck BMP in the a.m.

## 2023-07-10 NOTE — Hospital Course (Signed)
 Mr. Jesse Torres is a 73 year old male with hypertension, non-insulin-dependent diabetes mellitus, hyperlipidemia, pulmonary hypertension, heart failure preserved ejection fraction, obesity, no prior CKD who presents emergency department for chief concerns of weakness, dizziness, and poor p.o. intake.  Vitals in the ED showed t 96.7, rr 16, heart rate of 80, blood pressure initially 63/44, responded to IV fluid and improved to 104/58, SpO2 98% on room air.  Serum sodium is 132, potassium 4.1, chloride 98, bicarb 20, BUN of 80, serum creatinine of 2.11, EGFR of 33, nonfasting blood glucose 389, WBC 4.4, hemoglobin 12.4, platelets of 63.  Lactic acid was 2.4.  BNP and-C troponin were within normal limits.  Chest x-ray has been ordered and pending radiology read at this time.  ED treatment: LR 2 L bolus

## 2023-07-11 DIAGNOSIS — A419 Sepsis, unspecified organism: Secondary | ICD-10-CM | POA: Diagnosis not present

## 2023-07-11 DIAGNOSIS — R6520 Severe sepsis without septic shock: Secondary | ICD-10-CM | POA: Diagnosis not present

## 2023-07-11 LAB — CBC
HCT: 34.8 % — ABNORMAL LOW (ref 39.0–52.0)
Hemoglobin: 11.6 g/dL — ABNORMAL LOW (ref 13.0–17.0)
MCH: 27.5 pg (ref 26.0–34.0)
MCHC: 33.3 g/dL (ref 30.0–36.0)
MCV: 82.5 fL (ref 80.0–100.0)
Platelets: 62 10*3/uL — ABNORMAL LOW (ref 150–400)
RBC: 4.22 MIL/uL (ref 4.22–5.81)
RDW: 15.4 % (ref 11.5–15.5)
WBC: 3.3 10*3/uL — ABNORMAL LOW (ref 4.0–10.5)
nRBC: 0 % (ref 0.0–0.2)

## 2023-07-11 LAB — ECHOCARDIOGRAM COMPLETE
Height: 67 in
S' Lateral: 3 cm
Weight: 3121.71 [oz_av]

## 2023-07-11 LAB — GLUCOSE, CAPILLARY
Glucose-Capillary: 283 mg/dL — ABNORMAL HIGH (ref 70–99)
Glucose-Capillary: 277 mg/dL — ABNORMAL HIGH (ref 70–99)
Glucose-Capillary: 251 mg/dL — ABNORMAL HIGH (ref 70–99)
Glucose-Capillary: 238 mg/dL — ABNORMAL HIGH (ref 70–99)
Glucose-Capillary: 206 mg/dL — ABNORMAL HIGH (ref 70–99)
Glucose-Capillary: 170 mg/dL — ABNORMAL HIGH (ref 70–99)
Glucose-Capillary: 181 mg/dL — ABNORMAL HIGH (ref 70–99)

## 2023-07-11 LAB — BASIC METABOLIC PANEL WITH GFR
Anion gap: 7 (ref 5–15)
BUN: 70 mg/dL — ABNORMAL HIGH (ref 8–23)
CO2: 25 mmol/L (ref 22–32)
Calcium: 8.2 mg/dL — ABNORMAL LOW (ref 8.9–10.3)
Chloride: 105 mmol/L (ref 98–111)
Creatinine, Ser: 1.54 mg/dL — ABNORMAL HIGH (ref 0.61–1.24)
GFR, Estimated: 48 mL/min — ABNORMAL LOW (ref >60)
Glucose, Bld: 263 mg/dL — ABNORMAL HIGH (ref 70–99)
Potassium: 4.3 mmol/L (ref 3.5–5.1)
Sodium: 137 mmol/L (ref 135–145)

## 2023-07-11 LAB — PHOSPHORUS: Phosphorus: 2.9 mg/dL (ref 2.5–4.6)

## 2023-07-11 LAB — MAGNESIUM: Magnesium: 2.7 mg/dL — ABNORMAL HIGH (ref 1.7–2.4)

## 2023-07-11 LAB — CORTISOL: Cortisol, Plasma: 23.1 ug/dL

## 2023-07-11 LAB — PROCALCITONIN: Procalcitonin: 1.59 ng/mL

## 2023-07-11 MED ORDER — MIDODRINE HCL 5 MG PO TABS
5.0000 mg | ORAL_TABLET | Freq: Two times a day (BID) | ORAL | Status: DC
  Administered 2023-07-11 – 2023-07-12 (×2): 5 mg via ORAL
  Filled 2023-07-11 (×2): qty 1

## 2023-07-11 MED ORDER — CHLORHEXIDINE GLUCONATE CLOTH 2 % EX PADS
6.0000 | MEDICATED_PAD | Freq: Every day | CUTANEOUS | Status: DC
  Administered 2023-07-11 – 2023-07-14 (×3): 6 via TOPICAL

## 2023-07-11 MED ORDER — INSULIN GLARGINE-YFGN 100 UNIT/ML ~~LOC~~ SOLN
8.0000 [IU] | SUBCUTANEOUS | Status: DC
  Administered 2023-07-11 – 2023-07-14 (×4): 8 [IU] via SUBCUTANEOUS
  Filled 2023-07-11 (×4): qty 0.08

## 2023-07-11 MED ORDER — CARVEDILOL 12.5 MG PO TABS
12.5000 mg | ORAL_TABLET | Freq: Two times a day (BID) | ORAL | Status: DC
  Administered 2023-07-11 – 2023-07-14 (×7): 12.5 mg via ORAL
  Filled 2023-07-11 (×7): qty 1

## 2023-07-11 NOTE — Progress Notes (Signed)
 PROGRESS NOTE    Jesse Torres  UJW:[id number] DOB: 06-01-1950 DOA: 07/10/2023 PCP: Jolanda Nation, NP    Brief Narrative:  73 year old male with hypertension, non-insulin-dependent diabetes mellitus, hyperlipidemia, pulmonary hypertension, heart failure preserved ejection fraction, obesity, no prior CKD who presents emergency department for chief concerns of weakness, dizziness, and poor p.o. intake.   After admission patient noted to have declining maps.  Transferred to ICU.  PCCM engaged.  Required low-dose Levophed for short period of time.  Weaned off as of 5/28.   Assessment & Plan:   Principal Problem:   Severe sepsis with acute organ dysfunction (HCC) Active Problems:   Essential hypertension   Sleep apnea, obstructive   Obesity   Pulmonary hypertension (HCC)   RBBB (right bundle branch block)   Dyslipidemia   CAD (coronary artery disease)   Pure hypercholesterolemia   Type 2 diabetes mellitus without complication (HCC)   AKI (acute kidney injury) (HCC)   Hypothermia   CAP (community acquired pneumonia)  Septic shock Shock physiology has improved.  Patient required Levophed support for less than 12 hours.  Now weaned off as of 5/28 Plan: Continue broad-spectrum IV antibiotics MAP goal 65 Can likely transfer to medical telemetry Continue to monitor vitals and fever curve  CAP (community acquired pneumonia) IV fluids and broad-spectrum IV antibiotics   Hypothermia Suspect secondary to community-acquired pneumonia Required Bair hugger on admission, now weaned off   AKI (acute kidney injury) (HCC) Suspect secondary to sepsis versus poor p.o. intake   Type 2 diabetes mellitus without complication (HCC) Non-insulin-dependent diabetes mellitus Hold home metformin  Semglee 8 units nightly Sliding scale Carb modified diet   Sleep apnea, obstructive CPAP nightly ordered   Essential hypertension Patient initially low normotensive Levophed weaned off Hold home  BP meds Vitals per unit protocol   DVT prophylaxis: SQ heparin  Code Status: Full Family Communication: None today Disposition Plan: Status is: Inpatient Remains inpatient appropriate because: Resolving septic shock   Level of care: Stepdown  Consultants:  PCCM  Procedures:  None  Antimicrobials: Ceftriaxone Azithromycin   Subjective: Seen and examined.  Resting in bed.  Feels better.  Answers questions appropriately.  Appears fatigued.  Objective: Vitals:   07/11/23 1100 07/11/23 1200 07/11/23 1300 07/11/23 1400  BP: (!) 89/60 (!) 99/55 (!) 92/52 109/62  Pulse: 67 88 (!) 52 (!) 45  Resp: (!) 23 (!) 31 (!) 25 (!) 33  Temp:  99.2 F (37.3 C)    TempSrc:      SpO2: 97% 99% 98% 100%  Weight:      Height:        Intake/Output Summary (Last 24 hours) at 07/11/2023 1442 Last data filed at 07/11/2023 0649 Gross per 24 hour  Intake 3200.49 ml  Output 530 ml  Net 2670.49 ml   Filed Weights   07/10/23 1102 07/10/23 1300  Weight: 88.5 kg 88.5 kg    Examination:  General exam: Appears calm and comfortable  Respiratory system: Scattered crackles by laterally.  Normal work of breathing.  3 L Cardiovascular system: S1-S2, tachycardic, regular rhythm, no murmurs Gastrointestinal system: Soft, NT/ND, normal bowel sounds Central nervous system: Alert and oriented. No focal neurological deficits. Extremities: Symmetric 5 x 5 power. Skin: No rashes, lesions or ulcers Psychiatry: Judgement and insight appear normal. Mood & affect appropriate.     Data Reviewed: I have personally reviewed following labs and imaging studies  CBC: Recent Labs  Lab 07/10/23 1109 07/10/23 1644 07/11/23 0545  WBC 4.4 3.2* 3.3*  NEUTROABS 3.7 2.7  --   HGB 12.4* 11.5* 11.6*  HCT 37.5* 34.7* 34.8*  MCV 82.6 83.6 82.5  PLT 63* 58* 62*   Basic Metabolic Panel: Recent Labs  Lab 07/10/23 1109 07/11/23 0545  NA 132* 137  K 4.1 4.3  CL 98 105  CO2 20* 25  GLUCOSE 389* 263*  BUN  88* 70*  CREATININE 2.11* 1.54*  CALCIUM 7.9* 8.2*  MG 3.1* 2.7*  PHOS  --  2.9   GFR: Estimated Creatinine Clearance: 46.1 mL/min (A) (by C-G formula based on SCr of 1.54 mg/dL (H)). Liver Function Tests: Recent Labs  Lab 07/10/23 1109  AST 31  ALT 21  ALKPHOS 48  BILITOT 1.2  PROT 6.2*  ALBUMIN 2.5*   No results for input(s): "LIPASE", "AMYLASE" in the last 168 hours. No results for input(s): "AMMONIA" in the last 168 hours. Coagulation Profile: No results for input(s): "INR", "PROTIME" in the last 168 hours. Cardiac Enzymes: Recent Labs  Lab 07/10/23 1644  CKTOTAL 119   BNP (last 3 results) No results for input(s): "PROBNP" in the last 8760 hours. HbA1C: No results for input(s): "HGBA1C" in the last 72 hours. CBG: Recent Labs  Lab 07/10/23 1619 07/10/23 2218 07/11/23 0728 07/11/23 1120 07/11/23 1358  GLUCAP 283* 277* 251* 238* 206*   Lipid Profile: No results for input(s): "CHOL", "HDL", "LDLCALC", "TRIG", "CHOLHDL", "LDLDIRECT" in the last 72 hours. Thyroid  Function Tests: Recent Labs    07/10/23 1109  TSH 3.004   Anemia Panel: No results for input(s): "VITAMINB12", "FOLATE", "FERRITIN", "TIBC", "IRON", "RETICCTPCT" in the last 72 hours. Sepsis Labs: Recent Labs  Lab 07/10/23 1109 07/10/23 1115 07/10/23 1407 07/11/23 0545  PROCALCITON 2.65  --   --  1.59  LATICACIDVEN  --  2.4* 1.8  --     Recent Results (from the past 240 hours)  Culture, blood (routine x 2)     Status: None (Preliminary result)   Collection Time: 07/10/23 11:15 AM   Specimen: Right Antecubital; Blood  Result Value Ref Range Status   Specimen Description RIGHT ANTECUBITAL  Final   Special Requests   Final    BOTTLES DRAWN AEROBIC AND ANAEROBIC Blood Culture adequate volume   Culture   Final    NO GROWTH < 24 HOURS Performed at [facility], [address] [place], [place] [address]    Report Status PENDING  Incomplete  Culture, blood (routine x 2)      Status: None (Preliminary result)   Collection Time: 07/10/23 11:28 AM   Specimen: BLOOD  Result Value Ref Range Status   Specimen Description BLOOD LEFT ANTECUBITAL  Final   Special Requests   Final    BOTTLES DRAWN AEROBIC AND ANAEROBIC Blood Culture results may not be optimal due to an inadequate volume of blood received in culture bottles   Culture   Final    NO GROWTH < 24 HOURS Performed at [facility], [address] [place], [place] [address]    Report Status PENDING  Incomplete  MRSA Next Gen by PCR, Nasal     Status: None   Collection Time: 07/10/23  4:31 PM   Specimen: Nasal Swab  Result Value Ref Range Status   MRSA by PCR Next Gen NOT DETECTED NOT DETECTED Final    Comment: (NOTE) The GeneXpert MRSA Assay (FDA approved for NASAL specimens only), is one component of a comprehensive MRSA colonization surveillance program. It is not intended to diagnose MRSA infection nor to  guide or monitor treatment for MRSA infections. Test performance is not FDA approved in patients less than 41 years old. Performed at [facility], [address] [place], [place] [address]   Resp panel by RT-PCR (RSV, Flu A&B, Covid)     Status: None   Collection Time: 07/10/23  4:31 PM   Specimen: Nasal Swab  Result Value Ref Range Status   SARS Coronavirus 2 by RT PCR NEGATIVE NEGATIVE Final    Comment: (NOTE) SARS-CoV-2 target nucleic acids are NOT DETECTED.  The SARS-CoV-2 RNA is generally detectable in upper respiratory specimens during the acute phase of infection. The lowest concentration of SARS-CoV-2 viral copies this assay can detect is 138 copies/mL. A negative result does not preclude SARS-Cov-2 infection and should not be used as the sole basis for treatment or other patient management decisions. A negative result may occur with  improper specimen collection/handling, submission of specimen other than nasopharyngeal swab, presence of viral mutation(s) within  the areas targeted by this assay, and inadequate number of viral copies(<138 copies/mL). A negative result must be combined with clinical observations, patient history, and epidemiological information. The expected result is Negative.  Fact Sheet for Patients:  BloggerCourse.com  Fact Sheet for Healthcare Providers:  SeriousBroker.it  This test is no t yet approved or cleared by the [place]  FDA and  has been authorized for detection and/or diagnosis of SARS-CoV-2 by FDA under an Emergency Use Authorization (EUA). This EUA will remain  in effect (meaning this test can be used) for the duration of the COVID-19 declaration under Section 564(b)(1) of the Act, 21 U.S.C.section 360bbb-3(b)(1), unless the authorization is terminated  or revoked sooner.       Influenza A by PCR NEGATIVE NEGATIVE Final   Influenza B by PCR NEGATIVE NEGATIVE Final    Comment: (NOTE) The Xpert Xpress SARS-CoV-2/FLU/RSV plus assay is intended as an aid in the diagnosis of influenza from Nasopharyngeal swab specimens and should not be used as a sole basis for treatment. Nasal washings and aspirates are unacceptable for Xpert Xpress SARS-CoV-2/FLU/RSV testing.  Fact Sheet for Patients: BloggerCourse.com  Fact Sheet for Healthcare Providers: SeriousBroker.it  This test is not yet approved or cleared by the [place]  FDA and has been authorized for detection and/or diagnosis of SARS-CoV-2 by FDA under an Emergency Use Authorization (EUA). This EUA will remain in effect (meaning this test can be used) for the duration of the COVID-19 declaration under Section 564(b)(1) of the Act, 21 U.S.C. section 360bbb-3(b)(1), unless the authorization is terminated or revoked.     Resp Syncytial Virus by PCR NEGATIVE NEGATIVE Final    Comment: (NOTE) Fact Sheet for  Patients: BloggerCourse.com  Fact Sheet for Healthcare Providers: SeriousBroker.it  This test is not yet approved or cleared by the [place]  FDA and has been authorized for detection and/or diagnosis of SARS-CoV-2 by FDA under an Emergency Use Authorization (EUA). This EUA will remain in effect (meaning this test can be used) for the duration of the COVID-19 declaration under Section 564(b)(1) of the Act, 21 U.S.C. section 360bbb-3(b)(1), unless the authorization is terminated or revoked.  Performed at [facility], [address] [place], [place] [address]   Respiratory (~20 pathogens) panel by PCR     Status: None   Collection Time: 07/10/23  4:31 PM   Specimen: Nasopharyngeal Swab; Respiratory  Result Value Ref Range Status   Adenovirus NOT DETECTED NOT DETECTED Final   Coronavirus 229E NOT DETECTED NOT DETECTED Final  Comment: (NOTE) The Coronavirus on the Respiratory Panel, DOES NOT test for the novel  Coronavirus (2019 nCoV)    Coronavirus HKU1 NOT DETECTED NOT DETECTED Final   Coronavirus NL63 NOT DETECTED NOT DETECTED Final   Coronavirus OC43 NOT DETECTED NOT DETECTED Final   Metapneumovirus NOT DETECTED NOT DETECTED Final   Rhinovirus / Enterovirus NOT DETECTED NOT DETECTED Final   Influenza A NOT DETECTED NOT DETECTED Final   Influenza B NOT DETECTED NOT DETECTED Final   Parainfluenza Virus 1 NOT DETECTED NOT DETECTED Final   Parainfluenza Virus 2 NOT DETECTED NOT DETECTED Final   Parainfluenza Virus 3 NOT DETECTED NOT DETECTED Final   Parainfluenza Virus 4 NOT DETECTED NOT DETECTED Final   Respiratory Syncytial Virus NOT DETECTED NOT DETECTED Final   Bordetella pertussis NOT DETECTED NOT DETECTED Final   Bordetella Parapertussis NOT DETECTED NOT DETECTED Final   Chlamydophila pneumoniae NOT DETECTED NOT DETECTED Final   Mycoplasma pneumoniae NOT DETECTED NOT DETECTED Final    Comment: Performed at  [facility], [address] [place], [place] [address]         Radiology Studies: ECHOCARDIOGRAM COMPLETE Result Date: 07/11/2023    ECHOCARDIOGRAM REPORT   Patient Name:   Jesse Torres Date of Exam: 07/10/2023 Medical Rec #:  [id number]       Height:       67.0 in Accession #:    [id number]      Weight:       195.1 lb Date of Birth:  1951/01/08        BSA:          2.001 m Patient Age:    72 years        BP:           105/47 mmHg Patient Gender: M               HR:           117 bpm. Exam Location:  [facility] Procedure: 2D Echo, Cardiac Doppler and Color Doppler (Both Spectral and Color            Flow Doppler were utilized during procedure). Indications:     I48.91 Atrial Fibrillation  History:         Patient has prior history of Echocardiogram examinations, most                  recent 09/11/2022. Cardiomyopathy; Risk Factors:Hypertension.                  Chronic systolic heart failure. Pulmonary hypertension.  Sonographer:     Brigid Canada RDCS Referring Phys:  [id number] Delanna Fears Diagnosing Phys: Antionette Kirks MD IMPRESSIONS  1. Left ventricular ejection fraction, by estimation, is 55 to 60%. The left ventricle has normal function. Left ventricular endocardial border not optimally defined to evaluate regional wall motion. Left ventricular diastolic parameters are indeterminate.  2. Right ventricular systolic function is normal. The right ventricular size is normal. There is normal pulmonary artery systolic pressure.  3. The mitral valve is normal in structure. No evidence of mitral valve regurgitation. No evidence of mitral stenosis.  4. The aortic valve is normal in structure. Aortic valve regurgitation is not visualized. No aortic stenosis is present.  5. The inferior vena cava is normal in size with greater than 50% respiratory variability, suggesting right atrial pressure of 3 mmHg. FINDINGS  Left Ventricle: Left ventricular ejection fraction, by estimation, is 55 to 60%.  The  left ventricle has normal function. Left ventricular endocardial border not optimally defined to evaluate regional wall motion. The left ventricular internal cavity size was normal in size. There is borderline left ventricular hypertrophy. Left ventricular diastolic parameters are indeterminate. Right Ventricle: The right ventricular size is normal. No increase in right ventricular wall thickness. Right ventricular systolic function is normal. There is normal pulmonary artery systolic pressure. The tricuspid regurgitant velocity is 2.69 m/s, and  with an assumed right atrial pressure of 3 mmHg, the estimated right ventricular systolic pressure is 31.9 mmHg. Left Atrium: Left atrial size was normal in size. Right Atrium: Right atrial size was normal in size. Pericardium: There is no evidence of pericardial effusion. Mitral Valve: The mitral valve is normal in structure. No evidence of mitral valve regurgitation. No evidence of mitral valve stenosis. Tricuspid Valve: The tricuspid valve is normal in structure. Tricuspid valve regurgitation is trivial. No evidence of tricuspid stenosis. Aortic Valve: The aortic valve is normal in structure. Aortic valve regurgitation is not visualized. No aortic stenosis is present. Pulmonic Valve: The pulmonic valve was normal in structure. Pulmonic valve regurgitation is not visualized. No evidence of pulmonic stenosis. Aorta: The aortic root is normal in size and structure. Venous: The inferior vena cava is normal in size with greater than 50% respiratory variability, suggesting right atrial pressure of 3 mmHg. IAS/Shunts: No atrial level shunt detected by color flow Doppler.  LEFT VENTRICLE PLAX 2D LVIDd:         5.10 cm LVIDs:         3.00 cm LV PW:         0.90 cm LV IVS:        0.90 cm LVOT diam:     2.10 cm LV SV:         36 LV SV Index:   18 LVOT Area:     3.46 cm  RIGHT VENTRICLE             IVC RV Basal diam:  3.20 cm     IVC diam: 1.80 cm RV S prime:     11.42 cm/s TAPSE  (M-mode): 1.9 cm LEFT ATRIUM             Index        RIGHT ATRIUM           Index LA diam:        3.90 cm 1.95 cm/m   RA Area:     13.00 cm LA Vol (A2C):   24.3 ml 12.14 ml/m  RA Volume:   32.20 ml  16.09 ml/m LA Vol (A4C):   33.9 ml 16.94 ml/m LA Biplane Vol: 29.7 ml 14.84 ml/m  AORTIC VALVE LVOT Vmax:   72.00 cm/s LVOT Vmean:  51.867 cm/s LVOT VTI:    0.103 m  AORTA Ao Root diam: 3.60 cm MV E velocity: 92.18 cm/s  TRICUSPID VALVE                            TR Peak grad:   28.9 mmHg                            TR Vmax:        269.00 cm/s  SHUNTS                            Systemic VTI:  0.10 m                            Systemic Diam: 2.10 cm Antionette Kirks MD Electronically signed by Antionette Kirks MD Signature Date/Time: 07/11/2023/11:28:52 AM    Final    DG Chest Portable 1 View Result Date: 07/10/2023 CLINICAL DATA:  Weakness. EXAM: PORTABLE CHEST 1 VIEW COMPARISON:  Chest radiograph dated 02/04/2013. FINDINGS: Right mid to lower lung field opacity concerning for developing infiltrate. The left lung is clear. No large pleural effusion or pneumothorax. Stable cardiac silhouette. No acute osseous pathology. IMPRESSION: Right mid to lower lung field opacity concerning for developing infiltrate. Electronically Signed   By: Angus Bark M.D.   On: 07/10/2023 14:45        Scheduled Meds:  carvedilol   12.5 mg Oral BID WC   Chlorhexidine Gluconate Cloth  6 each Topical Daily   heparin   5,000 Units Subcutaneous Q8H   insulin aspart  0-5 Units Subcutaneous QHS   insulin aspart  0-9 Units Subcutaneous TID WC   insulin glargine-yfgn  8 Units Subcutaneous Q24H   Continuous Infusions:  sodium chloride     azithromycin 500 mg (07/10/23 1522)   cefTRIAXone (ROCEPHIN)  IV 2 g (07/10/23 1519)   lactated ringers Stopped (07/10/23 2055)     LOS: 1 day   CRITICAL CARE Performed by: Tiajuana Fluke   Total critical care time: 40 minutes  Critical care time was  exclusive of separately billable procedures and treating other patients.  Critical care was necessary to treat or prevent imminent or life-threatening deterioration.  Critical care was time spent personally by me on the following activities: development of treatment plan with patient and/or surrogate as well as nursing, discussions with consultants, evaluation of patient's response to treatment, examination of patient, obtaining history from patient or surrogate, ordering and performing treatments and interventions, ordering and review of laboratory studies, ordering and review of radiographic studies, pulse oximetry and re-evaluation of patient's condition.    Tiajuana Fluke, MD [facility]   If 7PM-7AM, please contact night-coverage  07/11/2023, 2:42 PM

## 2023-07-12 DIAGNOSIS — R6520 Severe sepsis without septic shock: Secondary | ICD-10-CM | POA: Diagnosis not present

## 2023-07-12 DIAGNOSIS — A419 Sepsis, unspecified organism: Secondary | ICD-10-CM | POA: Diagnosis not present

## 2023-07-12 LAB — BASIC METABOLIC PANEL WITH GFR
Anion gap: 8 (ref 5–15)
BUN: 46 mg/dL — ABNORMAL HIGH (ref 8–23)
CO2: 24 mmol/L (ref 22–32)
Calcium: 8.1 mg/dL — ABNORMAL LOW (ref 8.9–10.3)
Chloride: 107 mmol/L (ref 98–111)
Creatinine, Ser: 1.17 mg/dL (ref 0.61–1.24)
GFR, Estimated: >60 mL/min
Glucose, Bld: 181 mg/dL — ABNORMAL HIGH (ref 70–99)
Potassium: 3.6 mmol/L (ref 3.5–5.1)
Sodium: 139 mmol/L (ref 135–145)

## 2023-07-12 LAB — GLUCOSE, CAPILLARY
Glucose-Capillary: 165 mg/dL — ABNORMAL HIGH (ref 70–99)
Glucose-Capillary: 185 mg/dL — ABNORMAL HIGH (ref 70–99)
Glucose-Capillary: 228 mg/dL — ABNORMAL HIGH (ref 70–99)
Glucose-Capillary: 333 mg/dL — ABNORMAL HIGH (ref 70–99)

## 2023-07-12 LAB — CBC
HCT: 35.1 % — ABNORMAL LOW (ref 39.0–52.0)
Hemoglobin: 11.5 g/dL — ABNORMAL LOW (ref 13.0–17.0)
MCH: 26.9 pg (ref 26.0–34.0)
MCHC: 32.8 g/dL (ref 30.0–36.0)
MCV: 82.2 fL (ref 80.0–100.0)
Platelets: 72 K/uL — ABNORMAL LOW (ref 150–400)
RBC: 4.27 MIL/uL (ref 4.22–5.81)
RDW: 15.1 % (ref 11.5–15.5)
WBC: 3.3 K/uL — ABNORMAL LOW (ref 4.0–10.5)
nRBC: 0 % (ref 0.0–0.2)

## 2023-07-12 MED ORDER — MIDODRINE HCL 5 MG PO TABS
5.0000 mg | ORAL_TABLET | Freq: Three times a day (TID) | ORAL | Status: DC
  Administered 2023-07-12 – 2023-07-13 (×2): 5 mg via ORAL
  Filled 2023-07-12 (×2): qty 1

## 2023-07-12 MED ORDER — MIDODRINE HCL 5 MG PO TABS: 5.0000 mg | ORAL_TABLET | Freq: Three times a day (TID) | ORAL | Status: DC

## 2023-07-12 NOTE — Care Management Important Message (Signed)
 Important Message  Patient Details  Name: GEFFREY MICHAELSEN MRN: [id number] Date of Birth: 11/29/1950   Important Message Given:  Yes - Medicare IM     Anise Kerns 07/12/2023, 2:49 PM

## 2023-07-12 NOTE — Evaluation (Signed)
 Physical Therapy Evaluation Patient Details Name: Jesse Torres MRN: [id number] DOB: 02-05-1951 Today's Date: 07/12/2023  History of Present Illness  Pt is a 73 year old male who presents to ED for chief concerns of weakness, dizziness, and poor p.o. intake. Admitted with severe sepsis with acute organ dysfunction, CAP, hypothermia and AKI.  PMH of hypertension, non-insulin -dependent diabetes mellitus, hyperlipidemia, pulmonary hypertension, heart failure preserved ejection fraction, obesity, no prior CKD.   Clinical Impression  Patient received in bed, anxious to get up, but was told he could not. Patient is mod I with bed mobility, transfers with cga/min A. Ambulated 110 feet with +2 hand held assist progressing to +1 hand held assist. Patient is slightly unsteady, he will continue to benefit from skilled PT to improve safety and strength.          If plan is discharge home, recommend the following: A little help with walking and/or transfers;A little help with bathing/dressing/bathroom;Assist for transportation;Help with stairs or ramp for entrance   Can travel by private vehicle    yes    Equipment Recommendations None recommended by PT  Recommendations for Other Services       Functional Status Assessment Patient has had a recent decline in their functional status and demonstrates the ability to make significant improvements in function in a reasonable and predictable amount of time.     Precautions / Restrictions Precautions Precautions: Fall Recall of Precautions/Restrictions: Intact Restrictions Weight Bearing Restrictions Per Provider Order: No      Mobility  Bed Mobility Overal bed mobility: Independent                  Transfers Overall transfer level: Needs assistance Equipment used: 1 person hand held assist Transfers: Sit to/from Stand Sit to Stand: Contact guard assist                Ambulation/Gait Ambulation/Gait assistance: Contact guard  assist Gait Distance (Feet): 110 Feet Assistive device: 1 person hand held assist, 2 person hand held assist Gait Pattern/deviations: Step-through pattern, Decreased step length - right, Decreased step length - left, Decreased stride length, Staggering left, Staggering right Gait velocity: decr     General Gait Details: slightly unsteady initially, improving with increased distance, started with +2 hand held A, progressing to +1 hand held A. May benefit from AD.  Stairs            Wheelchair Mobility     Tilt Bed    Modified Rankin (Stroke Patients Only)       Balance Overall balance assessment: Needs assistance Sitting-balance support: Feet supported Sitting balance-Leahy Scale: Normal     Standing balance support: Single extremity supported, During functional activity Standing balance-Leahy Scale: Fair Standing balance comment: slightly unsteady, requires cga/min A if no device                             Pertinent Vitals/Pain Pain Assessment Pain Assessment: No/denies pain    Home Living Family/patient expects to be discharged to:: Private residence Living Arrangements: Alone Available Help at Discharge: Family;Available PRN/intermittently Type of Home: Mobile home Home Access: Stairs to enter Entrance Stairs-Rails: Right;Left;Can reach both Entrance Stairs-Number of Steps: 4   Home Layout: One level Home Equipment: Agricultural consultant (2 wheels);Wheelchair - manual;BSC/3in1      Prior Function Prior Level of Function : Independent/Modified Independent;Driving             Mobility Comments: no AD ADLs Comments:  IND; reports his daughter lives across the road from his house and he sees her daily     Extremity/Trunk Assessment   Upper Extremity Assessment Upper Extremity Assessment: Overall WFL for tasks assessed    Lower Extremity Assessment Lower Extremity Assessment: Generalized weakness    Cervical / Trunk Assessment Cervical /  Trunk Assessment: Normal  Communication   Communication Communication: No apparent difficulties    Cognition Arousal: Alert Behavior During Therapy: WFL for tasks assessed/performed   PT - Cognitive impairments: No apparent impairments                         Following commands: Intact       Cueing Cueing Techniques: Verbal cues     General Comments      Exercises     Assessment/Plan    PT Assessment Patient needs continued PT services  PT Problem List Decreased strength;Decreased activity tolerance;Decreased balance;Decreased mobility;Decreased knowledge of use of DME;Decreased safety awareness       PT Treatment Interventions DME instruction;Gait training;Stair training;Functional mobility training;Therapeutic activities;Therapeutic exercise;Balance training;Patient/family education    PT Goals (Current goals can be found in the Care Plan section)  Acute Rehab PT Goals Patient Stated Goal: return home PT Goal Formulation: With patient Time For Goal Achievement: 07/26/23 Potential to Achieve Goals: Good    Frequency Min 2X/week     Co-evaluation PT/OT/SLP Co-Evaluation/Treatment: Yes Reason for Co-Treatment: To address functional/ADL transfers;Necessary to address cognition/behavior during functional activity;For patient/therapist safety PT goals addressed during session: Mobility/safety with mobility;Balance OT goals addressed during session: ADL's and self-care       AM-PAC PT "6 Clicks" Mobility  Outcome Measure Help needed turning from your back to your side while in a flat bed without using bedrails?: None Help needed moving from lying on your back to sitting on the side of a flat bed without using bedrails?: None Help needed moving to and from a bed to a chair (including a wheelchair)?: A Little Help needed standing up from a chair using your arms (e.g., wheelchair or bedside chair)?: A Little Help needed to walk in hospital room?: A  Little Help needed climbing 3-5 steps with a railing? : A Little 6 Click Score: 20    End of Session   Activity Tolerance: Patient tolerated treatment well Patient left: in chair;with call bell/phone within reach;with chair alarm set Nurse Communication: Mobility status PT Visit Diagnosis: Other abnormalities of gait and mobility (R26.89);Difficulty in walking, not elsewhere classified (R26.2);Muscle weakness (generalized) (M62.81);Unsteadiness on feet (R26.81)    Time: 9604-5409 PT Time Calculation (min) (ACUTE ONLY): 19 min   Charges:   PT Evaluation $PT Eval Moderate Complexity: 1 Mod   PT General Charges $$ ACUTE PT VISIT: 1 Visit         Jarelly Rinck, PT, GCS 07/12/23,10:19 AM

## 2023-07-12 NOTE — Progress Notes (Signed)
 PROGRESS NOTE    Jesse Torres  ZOX:[id number] DOB: [birth year] DOA: 07/10/2023 PCP: Jolanda Nation, NP    Brief Narrative:  73 year old male with hypertension, non-insulin-dependent diabetes mellitus, hyperlipidemia, pulmonary hypertension, heart failure preserved ejection fraction, obesity, no prior CKD who presents emergency department for chief concerns of weakness, dizziness, and poor p.o. intake.   After admission patient noted to have declining maps.  Transferred to ICU.  PCCM engaged.  Required low-dose Levophed for short period of time.  Weaned off as of 5/28.   Assessment & Plan:   Principal Problem:   Severe sepsis with acute organ dysfunction (HCC) Active Problems:   Essential hypertension   Sleep apnea, obstructive   Obesity   Pulmonary hypertension (HCC)   RBBB (right bundle branch block)   Dyslipidemia   CAD (coronary artery disease)   Pure hypercholesterolemia   Type 2 diabetes mellitus without complication (HCC)   AKI (acute kidney injury) (HCC)   Hypothermia   CAP (community acquired pneumonia)  Septic shock Shock physiology has improved.  Patient required Levophed support for less than 12 hours.  Now weaned off as of 5/28 Plan: Continue broad-spectrum IV antibiotics MAP goal 65 Continue monitoring vitals and fever curve  CAP (community acquired pneumonia) Broad-spectrum IV antibiotics as above   Hypothermia Suspect secondary to community-acquired pneumonia Required Bair hugger on admission, now weaned off Temperature with vital sign checks   AKI (acute kidney injury) (HCC) Suspect secondary to sepsis versus poor p.o. intake Resolved   Type 2 diabetes mellitus without complication (HCC) Non-insulin-dependent diabetes mellitus Hold home metformin  Continue Semglee 8 units nightly Sliding scale Carb modified diet   Sleep apnea, obstructive Continue CPAP nightly   Essential hypertension Patient initially low normotensive Levophed weaned  off Continue p.o. midodrine, wean as tolerated   DVT prophylaxis: SQ heparin  Code Status: Full Family Communication: Daughter via phone [phone] on 5/28 Disposition Plan: Status is: Inpatient Remains inpatient appropriate because: Resolving septic shock   Level of care: Telemetry Medical  Consultants:  PCCM  Procedures:  None  Antimicrobials: Ceftriaxone Azithromycin   Subjective: Seen and examined.  Feeling better.  Resting in bed.  Appears fatigued otherwise stable.  Objective: Vitals:   07/11/23 2002 07/12/23 0421 07/12/23 0746 07/12/23 1011  BP: (!) 115/58 107/70 129/72   Pulse: 63 (!) 46 83   Resp: 20 16 (!) 24   Temp: 98.4 F (36.9 C) 97.8 F (36.6 C) 97.6 F (36.4 C)   TempSrc: Oral Oral Oral   SpO2: 98% 97% 98% 98%  Weight:      Height:        Intake/Output Summary (Last 24 hours) at 07/12/2023 1055 Last data filed at 07/11/2023 1603 Gross per 24 hour  Intake 14.71 ml  Output 500 ml  Net -485.29 ml   Filed Weights   07/10/23 1102 07/10/23 1300  Weight: 88.5 kg 88.5 kg    Examination:  General exam: NAD.  Appears fatigued Respiratory system: Bibasilar crackles.  Normal work of breathing.  Room air Cardiovascular system: S1-2, RRR, no murmurs, no pedal edema Gastrointestinal system: Soft, NT/ND, normal bowel sounds Central nervous system: Alert and oriented. No focal neurological deficits. Extremities: Symmetric 5 x 5 power. Skin: No rashes, lesions or ulcers Psychiatry: Judgement and insight appear normal. Mood & affect appropriate.     Data Reviewed: I have personally reviewed following labs and imaging studies  CBC: Recent Labs  Lab 07/10/23 1109 07/10/23 1644 07/11/23 0545 07/12/23 0422  WBC 4.4 3.2* 3.3*  3.3*  NEUTROABS 3.7 2.7  --   --   HGB 12.4* 11.5* 11.6* 11.5*  HCT 37.5* 34.7* 34.8* 35.1*  MCV 82.6 83.6 82.5 82.2  PLT 63* 58* 62* 72*   Basic Metabolic Panel: Recent Labs  Lab 07/10/23 1109 07/11/23 0545  07/12/23 0422  NA 132* 137 139  K 4.1 4.3 3.6  CL 98 105 107  CO2 20* 25 24  GLUCOSE 389* 263* 181*  BUN 88* 70* 46*  CREATININE 2.11* 1.54* 1.17  CALCIUM 7.9* 8.2* 8.1*  MG 3.1* 2.7*  --   PHOS  --  2.9  --    GFR: Estimated Creatinine Clearance: 60.6 mL/min (by C-G formula based on SCr of 1.17 mg/dL). Liver Function Tests: Recent Labs  Lab 07/10/23 1109  AST 31  ALT 21  ALKPHOS 48  BILITOT 1.2  PROT 6.2*  ALBUMIN 2.5*   No results for input(s): "LIPASE", "AMYLASE" in the last 168 hours. No results for input(s): "AMMONIA" in the last 168 hours. Coagulation Profile: No results for input(s): "INR", "PROTIME" in the last 168 hours. Cardiac Enzymes: Recent Labs  Lab 07/10/23 1644  CKTOTAL 119   BNP (last 3 results) No results for input(s): "PROBNP" in the last 8760 hours. HbA1C: No results for input(s): "HGBA1C" in the last 72 hours. CBG: Recent Labs  Lab 07/11/23 1120 07/11/23 1358 07/11/23 1641 07/11/23 2122 07/12/23 0749  GLUCAP 238* 206* 170* 181* 165*   Lipid Profile: No results for input(s): "CHOL", "HDL", "LDLCALC", "TRIG", "CHOLHDL", "LDLDIRECT" in the last 72 hours. Thyroid  Function Tests: Recent Labs    07/10/23 1109  TSH 3.004   Anemia Panel: No results for input(s): "VITAMINB12", "FOLATE", "FERRITIN", "TIBC", "IRON", "RETICCTPCT" in the last 72 hours. Sepsis Labs: Recent Labs  Lab 07/10/23 1109 07/10/23 1115 07/10/23 1407 07/11/23 0545  PROCALCITON 2.65  --   --  1.59  LATICACIDVEN  --  2.4* 1.8  --     Recent Results (from the past 240 hours)  Culture, blood (routine x 2)     Status: None (Preliminary result)   Collection Time: 07/10/23 11:15 AM   Specimen: Right Antecubital; Blood  Result Value Ref Range Status   Specimen Description RIGHT ANTECUBITAL  Final   Special Requests   Final    BOTTLES DRAWN AEROBIC AND ANAEROBIC Blood Culture adequate volume   Culture   Final    NO GROWTH 2 DAYS Performed at [facility],  [address], [place], [place] [address]    Report Status PENDING  Incomplete  Culture, blood (routine x 2)     Status: None (Preliminary result)   Collection Time: 07/10/23 11:28 AM   Specimen: BLOOD  Result Value Ref Range Status   Specimen Description BLOOD LEFT ANTECUBITAL  Final   Special Requests   Final    BOTTLES DRAWN AEROBIC AND ANAEROBIC Blood Culture results may not be optimal due to an inadequate volume of blood received in culture bottles   Culture   Final    NO GROWTH 2 DAYS Performed at [facility], [address] [place], [place] [address]    Report Status PENDING  Incomplete  MRSA Next Gen by PCR, Nasal     Status: None   Collection Time: 07/10/23  4:31 PM   Specimen: Nasal Swab  Result Value Ref Range Status   MRSA by PCR Next Gen NOT DETECTED NOT DETECTED Final    Comment: (NOTE) The GeneXpert MRSA Assay (FDA approved for NASAL specimens  only), is one component of a comprehensive MRSA colonization surveillance program. It is not intended to diagnose MRSA infection nor to guide or monitor treatment for MRSA infections. Test performance is not FDA approved in patients less than 70 years old. Performed at [facility], [address] [place], [place] [address]   Resp panel by RT-PCR (RSV, Flu A&B, Covid)     Status: None   Collection Time: 07/10/23  4:31 PM   Specimen: Nasal Swab  Result Value Ref Range Status   SARS Coronavirus 2 by RT PCR NEGATIVE NEGATIVE Final    Comment: (NOTE) SARS-CoV-2 target nucleic acids are NOT DETECTED.  The SARS-CoV-2 RNA is generally detectable in upper respiratory specimens during the acute phase of infection. The lowest concentration of SARS-CoV-2 viral copies this assay can detect is 138 copies/mL. A negative result does not preclude SARS-Cov-2 infection and should not be used as the sole basis for treatment or other patient management decisions. A negative result may occur with  improper specimen  collection/handling, submission of specimen other than nasopharyngeal swab, presence of viral mutation(s) within the areas targeted by this assay, and inadequate number of viral copies(<138 copies/mL). A negative result must be combined with clinical observations, patient history, and epidemiological information. The expected result is Negative.  Fact Sheet for Patients:  BloggerCourse.com  Fact Sheet for Healthcare Providers:  SeriousBroker.it  This test is no t yet approved or cleared by the [place]  FDA and  has been authorized for detection and/or diagnosis of SARS-CoV-2 by FDA under an Emergency Use Authorization (EUA). This EUA will remain  in effect (meaning this test can be used) for the duration of the COVID-19 declaration under Section 564(b)(1) of the Act, 21 U.S.C.section 360bbb-3(b)(1), unless the authorization is terminated  or revoked sooner.       Influenza A by PCR NEGATIVE NEGATIVE Final   Influenza B by PCR NEGATIVE NEGATIVE Final    Comment: (NOTE) The Xpert Xpress SARS-CoV-2/FLU/RSV plus assay is intended as an aid in the diagnosis of influenza from Nasopharyngeal swab specimens and should not be used as a sole basis for treatment. Nasal washings and aspirates are unacceptable for Xpert Xpress SARS-CoV-2/FLU/RSV testing.  Fact Sheet for Patients: BloggerCourse.com  Fact Sheet for Healthcare Providers: SeriousBroker.it  This test is not yet approved or cleared by the [place]  FDA and has been authorized for detection and/or diagnosis of SARS-CoV-2 by FDA under an Emergency Use Authorization (EUA). This EUA will remain in effect (meaning this test can be used) for the duration of the COVID-19 declaration under Section 564(b)(1) of the Act, 21 U.S.C. section 360bbb-3(b)(1), unless the authorization is terminated or revoked.     Resp Syncytial  Virus by PCR NEGATIVE NEGATIVE Final    Comment: (NOTE) Fact Sheet for Patients: BloggerCourse.com  Fact Sheet for Healthcare Providers: SeriousBroker.it  This test is not yet approved or cleared by the [place]  FDA and has been authorized for detection and/or diagnosis of SARS-CoV-2 by FDA under an Emergency Use Authorization (EUA). This EUA will remain in effect (meaning this test can be used) for the duration of the COVID-19 declaration under Section 564(b)(1) of the Act, 21 U.S.C. section 360bbb-3(b)(1), unless the authorization is terminated or revoked.  Performed at [facility], [address] [place], [place] [address]   Respiratory (~20 pathogens) panel by PCR     Status: None   Collection Time: 07/10/23  4:31 PM   Specimen: Nasopharyngeal Swab; Respiratory  Result Value  Ref Range Status   Adenovirus NOT DETECTED NOT DETECTED Final   Coronavirus 229E NOT DETECTED NOT DETECTED Final    Comment: (NOTE) The Coronavirus on the Respiratory Panel, DOES NOT test for the novel  Coronavirus (2019 nCoV)    Coronavirus HKU1 NOT DETECTED NOT DETECTED Final   Coronavirus NL63 NOT DETECTED NOT DETECTED Final   Coronavirus OC43 NOT DETECTED NOT DETECTED Final   Metapneumovirus NOT DETECTED NOT DETECTED Final   Rhinovirus / Enterovirus NOT DETECTED NOT DETECTED Final   Influenza A NOT DETECTED NOT DETECTED Final   Influenza B NOT DETECTED NOT DETECTED Final   Parainfluenza Virus 1 NOT DETECTED NOT DETECTED Final   Parainfluenza Virus 2 NOT DETECTED NOT DETECTED Final   Parainfluenza Virus 3 NOT DETECTED NOT DETECTED Final   Parainfluenza Virus 4 NOT DETECTED NOT DETECTED Final   Respiratory Syncytial Virus NOT DETECTED NOT DETECTED Final   Bordetella pertussis NOT DETECTED NOT DETECTED Final   Bordetella Parapertussis NOT DETECTED NOT DETECTED Final   Chlamydophila pneumoniae NOT DETECTED NOT DETECTED Final    Mycoplasma pneumoniae NOT DETECTED NOT DETECTED Final    Comment: Performed at [facility], [address], [place], [place] [address]         Radiology Studies: ECHOCARDIOGRAM COMPLETE Result Date: 07/11/2023    ECHOCARDIOGRAM REPORT   Patient Name:   DENISE BRAMBLETT Date of Exam: 07/10/2023 Medical Rec #:  [id number]       Height:       67.0 in Accession #:    [id number]      Weight:       195.1 lb Date of Birth:  09-May-1950        BSA:          2.001 m Patient Age:    72 years        BP:           105/47 mmHg Patient Gender: M               HR:           117 bpm. Exam Location:  [facility] Procedure: 2D Echo, Cardiac Doppler and Color Doppler (Both Spectral and Color            Flow Doppler were utilized during procedure). Indications:     I48.91 Atrial Fibrillation  History:         Patient has prior history of Echocardiogram examinations, most                  recent 09/11/2022. Cardiomyopathy; Risk Factors:Hypertension.                  Chronic systolic heart failure. Pulmonary hypertension.  Sonographer:     Brigid Canada RDCS Referring Phys:  [id number] Delanna Fears Diagnosing Phys: Antionette Kirks MD IMPRESSIONS  1. Left ventricular ejection fraction, by estimation, is 55 to 60%. The left ventricle has normal function. Left ventricular endocardial border not optimally defined to evaluate regional wall motion. Left ventricular diastolic parameters are indeterminate.  2. Right ventricular systolic function is normal. The right ventricular size is normal. There is normal pulmonary artery systolic pressure.  3. The mitral valve is normal in structure. No evidence of mitral valve regurgitation. No evidence of mitral stenosis.  4. The aortic valve is normal in structure. Aortic valve regurgitation is not visualized. No aortic stenosis is present.  5. The inferior vena cava is normal in size with greater than 50%  respiratory variability, suggesting right atrial pressure of 3 mmHg. FINDINGS  Left  Ventricle: Left ventricular ejection fraction, by estimation, is 55 to 60%. The left ventricle has normal function. Left ventricular endocardial border not optimally defined to evaluate regional wall motion. The left ventricular internal cavity size was normal in size. There is borderline left ventricular hypertrophy. Left ventricular diastolic parameters are indeterminate. Right Ventricle: The right ventricular size is normal. No increase in right ventricular wall thickness. Right ventricular systolic function is normal. There is normal pulmonary artery systolic pressure. The tricuspid regurgitant velocity is 2.69 m/s, and  with an assumed right atrial pressure of 3 mmHg, the estimated right ventricular systolic pressure is 31.9 mmHg. Left Atrium: Left atrial size was normal in size. Right Atrium: Right atrial size was normal in size. Pericardium: There is no evidence of pericardial effusion. Mitral Valve: The mitral valve is normal in structure. No evidence of mitral valve regurgitation. No evidence of mitral valve stenosis. Tricuspid Valve: The tricuspid valve is normal in structure. Tricuspid valve regurgitation is trivial. No evidence of tricuspid stenosis. Aortic Valve: The aortic valve is normal in structure. Aortic valve regurgitation is not visualized. No aortic stenosis is present. Pulmonic Valve: The pulmonic valve was normal in structure. Pulmonic valve regurgitation is not visualized. No evidence of pulmonic stenosis. Aorta: The aortic root is normal in size and structure. Venous: The inferior vena cava is normal in size with greater than 50% respiratory variability, suggesting right atrial pressure of 3 mmHg. IAS/Shunts: No atrial level shunt detected by color flow Doppler.  LEFT VENTRICLE PLAX 2D LVIDd:         5.10 cm LVIDs:         3.00 cm LV PW:         0.90 cm LV IVS:        0.90 cm LVOT diam:     2.10 cm LV SV:         36 LV SV Index:   18 LVOT Area:     3.46 cm  RIGHT VENTRICLE             IVC  RV Basal diam:  3.20 cm     IVC diam: 1.80 cm RV S prime:     11.42 cm/s TAPSE (M-mode): 1.9 cm LEFT ATRIUM             Index        RIGHT ATRIUM           Index LA diam:        3.90 cm 1.95 cm/m   RA Area:     13.00 cm LA Vol (A2C):   24.3 ml 12.14 ml/m  RA Volume:   32.20 ml  16.09 ml/m LA Vol (A4C):   33.9 ml 16.94 ml/m LA Biplane Vol: 29.7 ml 14.84 ml/m  AORTIC VALVE LVOT Vmax:   72.00 cm/s LVOT Vmean:  51.867 cm/s LVOT VTI:    0.103 m  AORTA Ao Root diam: 3.60 cm MV E velocity: 92.18 cm/s  TRICUSPID VALVE                            TR Peak grad:   28.9 mmHg                            TR Vmax:        269.00 cm/s  SHUNTS                            Systemic VTI:  0.10 m                            Systemic Diam: 2.10 cm Antionette Kirks MD Electronically signed by Antionette Kirks MD Signature Date/Time: 07/11/2023/11:28:52 AM    Final    DG Chest Portable 1 View Result Date: 07/10/2023 CLINICAL DATA:  Weakness. EXAM: PORTABLE CHEST 1 VIEW COMPARISON:  Chest radiograph dated 02/04/2013. FINDINGS: Right mid to lower lung field opacity concerning for developing infiltrate. The left lung is clear. No large pleural effusion or pneumothorax. Stable cardiac silhouette. No acute osseous pathology. IMPRESSION: Right mid to lower lung field opacity concerning for developing infiltrate. Electronically Signed   By: Angus Bark M.D.   On: 07/10/2023 14:45        Scheduled Meds:  carvedilol   12.5 mg Oral BID WC   Chlorhexidine Gluconate Cloth  6 each Topical Daily   heparin   5,000 Units Subcutaneous Q8H   insulin aspart  0-5 Units Subcutaneous QHS   insulin aspart  0-9 Units Subcutaneous TID WC   insulin glargine-yfgn  8 Units Subcutaneous Q24H   midodrine  5 mg Oral BID WC   Continuous Infusions:  azithromycin 500 mg (07/11/23 1622)   cefTRIAXone (ROCEPHIN)  IV 2 g (07/11/23 1603)     LOS: 2 days    Tiajuana Fluke, MD [facility]   If 7PM-7AM, please  contact night-coverage  07/12/2023, 10:55 AM

## 2023-07-12 NOTE — TOC Initial Note (Signed)
 Transition of Care [facility]) - Initial/Assessment Note    Patient Details  Name: Jesse Torres MRN: [id number] Date of Birth: [birth year]  Transition of Care [facility]) CM/SW Contact:    Odilia Bennett, LCSW Phone Number: 07/12/2023, 1:37 PM  Clinical Narrative: CSW met with patient. No family at bedside. CSW introduced role and explained that therapy recommendations would be discussed. Patient is unsure if he wants home health or not. Will follow up again closer to discharge. DME recommendation for 3-in-1 but he said he already has one. No further concerns. CSW will continue to follow patient for support and facilitate return home once stable. Family will transport him home at discharge.              Expected Discharge Plan:  (TBD. Home.) Barriers to Discharge: Continued Medical Work up   Patient Goals and CMS Choice            Expected Discharge Plan and Services     Post Acute Care Choice:  (TBD) Living arrangements for the past 2 months: Mobile Home                                      Prior Living Arrangements/Services Living arrangements for the past 2 months: Mobile Home Lives with:: Self Patient language and need for interpreter reviewed:: Yes Do you feel safe going back to the place where you live?: Yes      Need for Family Participation in Patient Care: Yes (Comment)   Current home services: DME Criminal Activity/Legal Involvement Pertinent to Current Situation/Hospitalization: No - Comment as needed  Activities of Daily Living   ADL Screening (condition at time of admission) Independently performs ADLs?: Yes (appropriate for developmental age) Is the patient deaf or have difficulty hearing?: No Does the patient have difficulty seeing, even when wearing glasses/contacts?: No Does the patient have difficulty concentrating, remembering, or making decisions?: No  Permission Sought/Granted                  Emotional Assessment Appearance:: Appears  stated age Attitude/Demeanor/Rapport: Engaged, Gracious Affect (typically observed): Accepting, Appropriate, Calm, Pleasant Orientation: : Oriented to Self, Oriented to Place, Oriented to  Time, Oriented to Situation Alcohol / Substance Use: Not Applicable Psych Involvement: No (comment)  Admission diagnosis:  Generalized weakness [R53.1] AKI (acute kidney injury) (HCC) [N17.9] Severe sepsis with acute organ dysfunction (HCC) [A41.9, R65.20] Hypotension, unspecified hypotension type [I95.9] Patient Active Problem List   Diagnosis Date Noted   Severe sepsis with acute organ dysfunction (HCC) 07/10/2023   AKI (acute kidney injury) (HCC) 07/10/2023   Hypothermia 07/10/2023   CAP (community acquired pneumonia) 07/10/2023   Establishing care with new doctor, encounter for 06/18/2023   Screening for colon cancer 06/18/2023   Type 2 diabetes mellitus without complication (HCC) 09/26/2021   Pure hypercholesterolemia 09/25/2021   Cystoid macular edema of left eye 12/08/2019   Postherpetic neuralgia 02/04/2015   PVC's (premature ventricular contractions) 02/16/2014   CAD (coronary artery disease) 05/05/2013   Dyslipidemia 03/11/2013   Cardiomyopathy (HCC)    HFimpEF (heart failure with improved EF    Thrombocytopenia (HCC)    Snoring    Right ventricular dysfunction 02/06/2013   Pulmonary hypertension (HCC) 02/06/2013   RBBB (right bundle branch block) 02/06/2013   Essential hypertension 02/05/2013   Sleep apnea, obstructive 02/05/2013   Obesity 02/05/2013   PCP:  Jolanda Nation, NP Pharmacy:  [id number] [place], [place] - [address] [place] [address] Phone: [phone] Fax: [phone]     Social Drivers of Health (SDOH) Social History: SDOH Screenings   Food Insecurity: No Food Insecurity (07/10/2023)  Housing: Low Risk  (07/10/2023)  Transportation Needs: No Transportation Needs (07/10/2023)  Utilities: Not At Risk (07/10/2023)   Depression (PHQ2-9): Low Risk  (06/27/2023)  Social Connections: Moderately Integrated (07/11/2023)  Tobacco Use: Medium Risk (07/10/2023)   SDOH Interventions: Social Connections Interventions: Intervention Not Indicated   Readmission Risk Interventions     No data to display

## 2023-07-12 NOTE — Evaluation (Signed)
 Occupational Therapy Evaluation Patient Details Name: Jesse Torres MRN: [id number] DOB: 03-Dec-1950 Today's Date: 07/12/2023   History of Present Illness   Pt is a 73 year old male who presents to ED for chief concerns of weakness, dizziness, and poor p.o. intake. Admitted with severe sepsis with acute organ dysfunction, CAP, hypothermia and AKI.  PMH of hypertension, non-insulin-dependent diabetes mellitus, hyperlipidemia, pulmonary hypertension, heart failure preserved ejection fraction, obesity, no prior CKD.     Clinical Impressions Pt was seen for PT/OT co-evaluation this date. PTA, pt resides in a mobile home with 4 STE and bil HR by himself. Reports IND with all ADLs, IADLs and mobility without AD use. His daughter checks on him daily and works across the road from his house. He denies fall history.  Pt presents to acute OT demonstrating impaired ADL performance and functional mobility 2/2 weakness and mild balance deficits. He denies pain and is alert and oriented x4. Reports he is ready to return home. Pt currently requires MOD I for bed mobility with HOB greatly elevated. He demo LB dressing to doff hospital socks and don regular socks and his tennis shoes seated at EOB with supervision, increased time. STS from EOB without AD with supervision x2 d/t nurse needing to check his buttocks/skin for breakdown. He ambulated out into the hallway with HHAx2 intially d/t feeling mildly unsteady after being in bed for so long, but progressed to HHA x1 with CGA. Pt left seated in recliner to eat his breakfast with all needs in place. Will follow acutely to ensure safety and IND to return home on his own. Recommend BSC as needed for urgency with urination to maximize safety. May benefit from [facility] on DC to ensure safety in home environment.      If plan is discharge home, recommend the following:   A little help with walking and/or transfers;A little help with bathing/dressing/bathroom;Help with  stairs or ramp for entrance     Functional Status Assessment   Patient has had a recent decline in their functional status and demonstrates the ability to make significant improvements in function in a reasonable and predictable amount of time.     Equipment Recommendations   BSC/3in1     Recommendations for Other Services         Precautions/Restrictions   Precautions Precautions: Fall Restrictions Weight Bearing Restrictions Per Provider Order: No     Mobility Bed Mobility Overal bed mobility: Modified Independent             General bed mobility comments: HOB fully elevated, no physical assist needed    Transfers Overall transfer level: Needs assistance Equipment used: 2 person hand held assist, 1 person hand held assist Transfers: Sit to/from Stand Sit to Stand: Supervision           General transfer comment: supervision for STS from EOB, ambulated with HHA x2 initially progressing to HHA x1 with CGA      Balance Overall balance assessment: Needs assistance                                         ADL either performed or assessed with clinical judgement   ADL Overall ADL's : Needs assistance/impaired                     Lower Body Dressing: Supervision/safety;Sitting/lateral leans Lower Body Dressing Details (indicate cue type and reason):  donned bil socks and shoes at EOB without issue; increased time Toilet Transfer: Contact guard assist Toilet Transfer Details (indicate cue type and reason): simulated from EOB and to recliner with CGA         Functional mobility during ADLs: Contact guard assist General ADL Comments: HHA x2 progressing to x1 with CGA for safety     Vision         Perception         Praxis         Pertinent Vitals/Pain Pain Assessment Pain Assessment: No/denies pain     Extremity/Trunk Assessment Upper Extremity Assessment Upper Extremity Assessment: Overall WFL for tasks  assessed   Lower Extremity Assessment Lower Extremity Assessment: Generalized weakness   Cervical / Trunk Assessment Cervical / Trunk Assessment: Normal   Communication Communication Communication: No apparent difficulties   Cognition Arousal: Alert Behavior During Therapy: WFL for tasks assessed/performed               OT - Cognition Comments: alert and oriented x4; anxious to get back home                 Following commands: Intact       Cueing  General Comments   Cueing Techniques: Verbal cues  HR and sp02 stable; 98% sp02 on RA after ambulate   Exercises Other Exercises Other Exercises: Edu on role of OT in acute setting.   Shoulder Instructions      Home Living Family/patient expects to be discharged to:: Private residence Living Arrangements: Alone Available Help at Discharge: Family;Available PRN/intermittently Type of Home: Mobile home Home Access: Stairs to enter Entrance Stairs-Number of Steps: 4 Entrance Stairs-Rails: Right;Left;Can reach both Home Layout: One level     Bathroom Shower/Tub: Chief Strategy Officer: Standard     Home Equipment: Agricultural consultant (2 wheels);Wheelchair - manual;BSC/3in1          Prior Functioning/Environment Prior Level of Function : Independent/Modified Independent;Driving             Mobility Comments: no AD ADLs Comments: IND; reports his daughter lives across the road from his house and he sees her daily    OT Problem List: Decreased strength;Impaired balance (sitting and/or standing)   OT Treatment/Interventions: Self-care/ADL training;Therapeutic exercise;Therapeutic activities;Energy conservation;Patient/family education;Balance training      OT Goals(Current goals can be found in the care plan section)   Acute Rehab OT Goals Patient Stated Goal: return home OT Goal Formulation: With patient Time For Goal Achievement: 07/26/23 Potential to Achieve Goals: Good ADL  Goals Pt Will Perform Lower Body Bathing: with modified independence;sitting/lateral leans;sit to/from stand Pt Will Perform Lower Body Dressing: with modified independence;sitting/lateral leans;sit to/from stand Pt Will Transfer to Toilet: with modified independence;regular height toilet;ambulating Pt Will Perform Toileting - Clothing Manipulation and hygiene: with modified independence;sit to/from stand;sitting/lateral leans   OT Frequency:  Min 2X/week    Co-evaluation PT/OT/SLP Co-Evaluation/Treatment: Yes Reason for Co-Treatment: To address functional/ADL transfers;Necessary to address cognition/behavior during functional activity;For patient/therapist safety PT goals addressed during session: Mobility/safety with mobility;Balance OT goals addressed during session: ADL's and self-care      AM-PAC OT "6 Clicks" Daily Activity     Outcome Measure Help from another person eating meals?: None Help from another person taking care of personal grooming?: None Help from another person toileting, which includes using toliet, bedpan, or urinal?: A Little Help from another person bathing (including washing, rinsing, drying)?: A Little Help from another person to  put on and taking off regular upper body clothing?: None Help from another person to put on and taking off regular lower body clothing?: None 6 Click Score: 22   End of Session Nurse Communication: Mobility status  Activity Tolerance: Patient tolerated treatment well Patient left: in chair;with call bell/phone within reach;with chair alarm set  OT Visit Diagnosis: Other abnormalities of gait and mobility (R26.89);Muscle weakness (generalized) (M62.81)                Time: 7829-5621 OT Time Calculation (min): 18 min Charges:  OT General Charges $OT Visit: 1 Visit OT Evaluation $OT Eval Low Complexity: 1 Low Beckhem Isadore, OTR/L  07/12/23, 10:23 AM  Leonard Raker 07/12/2023, 10:19 AM

## 2023-07-12 NOTE — Plan of Care (Signed)
  Problem: Nutritional: Goal: Maintenance of adequate nutrition will improve Outcome: Progressing   Problem: Skin Integrity: Goal: Risk for impaired skin integrity will decrease Outcome: Progressing   Problem: Fluid Volume: Goal: Hemodynamic stability will improve Outcome: Progressing

## 2023-07-13 DIAGNOSIS — R6520 Severe sepsis without septic shock: Secondary | ICD-10-CM | POA: Diagnosis not present

## 2023-07-13 DIAGNOSIS — A419 Sepsis, unspecified organism: Secondary | ICD-10-CM | POA: Diagnosis not present

## 2023-07-13 LAB — BASIC METABOLIC PANEL WITH GFR
Anion gap: 10 (ref 5–15)
BUN: 38 mg/dL — ABNORMAL HIGH (ref 8–23)
CO2: 24 mmol/L (ref 22–32)
Calcium: 8.6 mg/dL — ABNORMAL LOW (ref 8.9–10.3)
Chloride: 106 mmol/L (ref 98–111)
Creatinine, Ser: 1.18 mg/dL (ref 0.61–1.24)
GFR, Estimated: >60 mL/min (ref >60)
Glucose, Bld: 174 mg/dL — ABNORMAL HIGH (ref 70–99)
Potassium: 3.7 mmol/L (ref 3.5–5.1)
Sodium: 140 mmol/L (ref 135–145)

## 2023-07-13 LAB — CBC
HCT: 39.2 % (ref 39.0–52.0)
Hemoglobin: 12.6 g/dL — ABNORMAL LOW (ref 13.0–17.0)
MCH: 26.9 pg (ref 26.0–34.0)
MCHC: 32.1 g/dL (ref 30.0–36.0)
MCV: 83.6 fL (ref 80.0–100.0)
Platelets: 110 10*3/uL — ABNORMAL LOW (ref 150–400)
RBC: 4.69 MIL/uL (ref 4.22–5.81)
RDW: 15.1 % (ref 11.5–15.5)
WBC: 5.1 10*3/uL (ref 4.0–10.5)
nRBC: 0 % (ref 0.0–0.2)

## 2023-07-13 LAB — GLUCOSE, CAPILLARY
Glucose-Capillary: 198 mg/dL — ABNORMAL HIGH (ref 70–99)
Glucose-Capillary: 233 mg/dL — ABNORMAL HIGH (ref 70–99)
Glucose-Capillary: 211 mg/dL — ABNORMAL HIGH (ref 70–99)
Glucose-Capillary: 216 mg/dL — ABNORMAL HIGH (ref 70–99)

## 2023-07-13 LAB — LEGIONELLA PNEUMOPHILA SEROGP 1 UR AG: L. pneumophila Serogp 1 Ur Ag: NEGATIVE

## 2023-07-13 MED ORDER — AZITHROMYCIN 250 MG PO TABS
500.0000 mg | ORAL_TABLET | ORAL | Status: AC
  Administered 2023-07-13 – 2023-07-14 (×2): 500 mg via ORAL
  Filled 2023-07-13 (×2): qty 2

## 2023-07-13 MED ORDER — MIDODRINE HCL 5 MG PO TABS
5.0000 mg | ORAL_TABLET | Freq: Two times a day (BID) | ORAL | Status: DC
  Administered 2023-07-13: 5 mg via ORAL
  Filled 2023-07-13: qty 1

## 2023-07-13 NOTE — Progress Notes (Signed)
 Occupational Therapy Treatment Patient Details Name: Jesse Torres MRN: [id number] DOB: 1951-01-01 Today's Date: 07/13/2023   History of present illness Pt is a 73 year old male who presents to ED for chief concerns of weakness, dizziness, and poor p.o. intake. Admitted with severe sepsis with acute organ dysfunction, CAP, hypothermia and AKI.  PMH of hypertension, non-insulin-dependent diabetes mellitus, hyperlipidemia, pulmonary hypertension, heart failure preserved ejection fraction, obesity, no prior CKD.   OT comments  Pt is seated in recliner on arrival with daughter present. Pleasant and agreeable to OT session. He denies pain. BP 106/63. Pt performed LB dressing to don underwear with supervision. Pt declined further ADLs reporting he had already been to the bathroom and has his bath. Pt wished to use RW throughout session today, ambulated 2 laps around the nursing station ~360 feet with supervision and no LOB.  Pt returned to recliner with all needs in place and will cont to require skilled acute OT services to maximize his safety and IND to return to PLOF.       If plan is discharge home, recommend the following:  A little help with walking and/or transfers;A little help with bathing/dressing/bathroom;Help with stairs or ramp for entrance   Equipment Recommendations  BSC/3in1;Other (comment) (RW)    Recommendations for Other Services      Precautions / Restrictions Precautions Precautions: Fall Recall of Precautions/Restrictions: Intact Restrictions Weight Bearing Restrictions Per Provider Order: No       Mobility Bed Mobility               General bed mobility comments: up in chair on arrival    Transfers Overall transfer level: Needs assistance Equipment used: Rolling walker (2 wheels) Transfers: Sit to/from Stand Sit to Stand: Supervision           General transfer comment: supervision for STS from recliner to RW; ambulated 2 laps around nursing station  using RW with supervision-pt felt most comfortable with the RW     Balance Overall balance assessment: Needs assistance Sitting-balance support: Feet supported Sitting balance-Leahy Scale: Normal     Standing balance support: During functional activity, Reliant on assistive device for balance, Bilateral upper extremity supported Standing balance-Leahy Scale: Good Standing balance comment: supervision using RW this date                           ADL either performed or assessed with clinical judgement   ADL Overall ADL's : Needs assistance/impaired                     Lower Body Dressing: Supervision/safety;Sitting/lateral leans Lower Body Dressing Details (indicate cue type and reason): to don underwear                    Extremity/Trunk Assessment              Vision       Perception     Praxis     Communication Communication Communication: No apparent difficulties   Cognition Arousal: Alert Behavior During Therapy: WFL for tasks assessed/performed                                 Following commands: Intact        Cueing   Cueing Techniques: Verbal cues  Exercises      Shoulder Instructions       General Comments  Pertinent Vitals/ Pain       Pain Assessment Pain Assessment: No/denies pain  Home Living                                          Prior Functioning/Environment              Frequency  Min 2X/week        Progress Toward Goals  OT Goals(current goals can now be found in the care plan section)  Progress towards OT goals: Progressing toward goals  Acute Rehab OT Goals Patient Stated Goal: return home OT Goal Formulation: With patient Time For Goal Achievement: 07/26/23 Potential to Achieve Goals: Good  Plan      Co-evaluation                 AM-PAC OT "6 Clicks" Daily Activity     Outcome Measure   Help from another person eating meals?:  None Help from another person taking care of personal grooming?: None Help from another person toileting, which includes using toliet, bedpan, or urinal?: A Little Help from another person bathing (including washing, rinsing, drying)?: A Little Help from another person to put on and taking off regular upper body clothing?: None Help from another person to put on and taking off regular lower body clothing?: None 6 Click Score: 22    End of Session Equipment Utilized During Treatment: Gait belt;Rolling walker (2 wheels)  OT Visit Diagnosis: Other abnormalities of gait and mobility (R26.89);Muscle weakness (generalized) (M62.81)   Activity Tolerance Patient tolerated treatment well   Patient Left in chair;with call bell/phone within reach;with chair alarm set   Nurse Communication Mobility status        Time: 4098-1191 OT Time Calculation (min): 20 min  Charges: OT General Charges $OT Visit: 1 Visit OT Treatments $Therapeutic Activity: 8-22 mins  Tomio Kirk, OTR/L  07/13/23, 1:16 PM   Jaysun Wessels E Kaitlyne Friedhoff 07/13/2023, 1:14 PM

## 2023-07-13 NOTE — Progress Notes (Signed)
 PROGRESS NOTE    Jesse Torres  UJW:[id number] DOB: [birth year] DOA: 07/10/2023 PCP: Jolanda Nation, NP    Brief Narrative:  73 year old male with hypertension, non-insulin-dependent diabetes mellitus, hyperlipidemia, pulmonary hypertension, heart failure preserved ejection fraction, obesity, no prior CKD who presents emergency department for chief concerns of weakness, dizziness, and poor p.o. intake.   After admission patient noted to have declining maps.  Transferred to ICU.  PCCM engaged.  Required low-dose Levophed for short period of time.  Weaned off as of 5/28.   Assessment & Plan:   Principal Problem:   Severe sepsis with acute organ dysfunction (HCC) Active Problems:   Essential hypertension   Sleep apnea, obstructive   Obesity   Pulmonary hypertension (HCC)   RBBB (right bundle branch block)   Dyslipidemia   CAD (coronary artery disease)   Pure hypercholesterolemia   Type 2 diabetes mellitus without complication (HCC)   AKI (acute kidney injury) (HCC)   Hypothermia   CAP (community acquired pneumonia)  Septic shock Shock physiology has improved.  Patient required Levophed support for less than 12 hours.  Now weaned off as of 5/28 Remains on midodrine Plan: Continue broad-spectrum IV antibiotics MAP goal 65 Continue monitoring vitals and fever curve Wean midodrine  CAP (community acquired pneumonia) Continue as above   Hypothermia Suspect secondary to community-acquired pneumonia Required Bair hugger on admission, now weaned off Temperature with vital sign checks   AKI (acute kidney injury) (HCC) Suspect secondary to sepsis versus poor p.o. intake Resolved   Type 2 diabetes mellitus without complication (HCC) Non-insulin-dependent diabetes mellitus Hold home metformin  Continue Semglee 8 units nightly Sliding scale Carb modified diet   Sleep apnea, obstructive Continue CPAP nightly   Essential hypertension Patient initially low  normotensive Levophed weaned off Wean midodrine   DVT prophylaxis: SQ heparin  Code Status: Full Family Communication: Daughter via phone [phone] on 5/28, at bedside 5/30 Disposition Plan: Status is: Inpatient Remains inpatient appropriate because: Resolving septic shock   Level of care: Telemetry Medical  Consultants:  PCCM  Procedures:  None  Antimicrobials: Ceftriaxone Azithromycin   Subjective: Seen and examined.  Feeling better.  Wants to go home.  Objective: Vitals:   07/12/23 1610 07/13/23 0237 07/13/23 0347 07/13/23 1000  BP: (!) 98/47 134/75 126/70 106/72  Pulse: 90 80 95   Resp: 16  18   Temp: 97.7 F (36.5 C)  98.2 F (36.8 C)   TempSrc: Oral  Oral   SpO2: 100% 99% 100%   Weight:      Height:        Intake/Output Summary (Last 24 hours) at 07/13/2023 1104 Last data filed at 07/13/2023 1042 Gross per 24 hour  Intake 0 ml  Output 300 ml  Net -300 ml   Filed Weights   07/10/23 1102 07/10/23 1300  Weight: 88.5 kg 88.5 kg    Examination:  General exam: No acute distress Respiratory system: Lungs clear.  Normal work of breathing.  Room air Cardiovascular system: S1-2, RRR, no murmurs, no pedal edema Gastrointestinal system: Soft, NT/ND, normal bowel sounds Central nervous system: Alert and oriented. No focal neurological deficits. Extremities: Symmetric 5 x 5 power. Skin: No rashes, lesions or ulcers Psychiatry: Judgement and insight appear normal. Mood & affect appropriate.     Data Reviewed: I have personally reviewed following labs and imaging studies  CBC: Recent Labs  Lab 07/10/23 1109 07/10/23 1644 07/11/23 0545 07/12/23 0422 07/13/23 0417  WBC 4.4 3.2* 3.3* 3.3* 5.1  NEUTROABS 3.7 2.7  --   --   --  HGB 12.4* 11.5* 11.6* 11.5* 12.6*  HCT 37.5* 34.7* 34.8* 35.1* 39.2  MCV 82.6 83.6 82.5 82.2 83.6  PLT 63* 58* 62* 72* 110*   Basic Metabolic Panel: Recent Labs  Lab 07/10/23 1109 07/11/23 0545 07/12/23 0422  07/13/23 0417  NA 132* 137 139 140  K 4.1 4.3 3.6 3.7  CL 98 105 107 106  CO2 20* 25 24 24   GLUCOSE 389* 263* 181* 174*  BUN 88* 70* 46* 38*  CREATININE 2.11* 1.54* 1.17 1.18  CALCIUM 7.9* 8.2* 8.1* 8.6*  MG 3.1* 2.7*  --   --   PHOS  --  2.9  --   --    GFR: Estimated Creatinine Clearance: 60.1 mL/min (by C-G formula based on SCr of 1.18 mg/dL). Liver Function Tests: Recent Labs  Lab 07/10/23 1109  AST 31  ALT 21  ALKPHOS 48  BILITOT 1.2  PROT 6.2*  ALBUMIN  2.5*   No results for input(s): "LIPASE", "AMYLASE" in the last 168 hours. No results for input(s): "AMMONIA" in the last 168 hours. Coagulation Profile: No results for input(s): "INR", "PROTIME" in the last 168 hours. Cardiac Enzymes: Recent Labs  Lab 07/10/23 1644  CKTOTAL 119   BNP (last 3 results) No results for input(s): "PROBNP" in the last 8760 hours. HbA1C: No results for input(s): "HGBA1C" in the last 72 hours. CBG: Recent Labs  Lab 07/12/23 0749 07/12/23 1152 07/12/23 1720 07/12/23 2137 07/13/23 0913  GLUCAP 165* 185* 228* 333* 198*   Lipid Profile: No results for input(s): "CHOL", "HDL", "LDLCALC", "TRIG", "CHOLHDL", "LDLDIRECT" in the last 72 hours. Thyroid  Function Tests: Recent Labs    07/10/23 1109  TSH 3.004   Anemia Panel: No results for input(s): "VITAMINB12", "FOLATE", "FERRITIN", "TIBC", "IRON", "RETICCTPCT" in the last 72 hours. Sepsis Labs: Recent Labs  Lab 07/10/23 1109 07/10/23 1115 07/10/23 1407 07/11/23 0545  PROCALCITON 2.65  --   --  1.59  LATICACIDVEN  --  2.4* 1.8  --     Recent Results (from the past 240 hours)  Culture, blood (routine x 2)     Status: None (Preliminary result)   Collection Time: 07/10/23 11:15 AM   Specimen: Right Antecubital; Blood  Result Value Ref Range Status   Specimen Description RIGHT ANTECUBITAL  Final   Special Requests   Final    BOTTLES DRAWN AEROBIC AND ANAEROBIC Blood Culture adequate volume   Culture   Final    NO GROWTH  3 DAYS Performed at [facility] [address] [place], [place] [address]    Report Status PENDING  Incomplete  Culture, blood (routine x 2)     Status: None (Preliminary result)   Collection Time: 07/10/23 11:28 AM   Specimen: BLOOD  Result Value Ref Range Status   Specimen Description BLOOD LEFT ANTECUBITAL  Final   Special Requests   Final    BOTTLES DRAWN AEROBIC AND ANAEROBIC Blood Culture results may not be optimal due to an inadequate volume of blood received in culture bottles   Culture   Final    NO GROWTH 3 DAYS Performed at [facility], [address], [place], [place] [address]    Report Status PENDING  Incomplete  MRSA Next Gen by PCR, Nasal     Status: None   Collection Time: 07/10/23  4:31 PM   Specimen: Nasal Swab  Result Value Ref Range Status   MRSA by PCR Next Gen NOT DETECTED NOT DETECTED Final    Comment: (NOTE) The  GeneXpert MRSA Assay (FDA approved for NASAL specimens only), is one component of a comprehensive MRSA colonization surveillance program. It is not intended to diagnose MRSA infection nor to guide or monitor treatment for MRSA infections. Test performance is not FDA approved in patients less than 83 years old. Performed at [facility], [address] [place], [place] [address]   Resp panel by RT-PCR (RSV, Flu A&B, Covid)     Status: None   Collection Time: 07/10/23  4:31 PM   Specimen: Nasal Swab  Result Value Ref Range Status   SARS Coronavirus 2 by RT PCR NEGATIVE NEGATIVE Final    Comment: (NOTE) SARS-CoV-2 target nucleic acids are NOT DETECTED.  The SARS-CoV-2 RNA is generally detectable in upper respiratory specimens during the acute phase of infection. The lowest concentration of SARS-CoV-2 viral copies this assay can detect is 138 copies/mL. A negative result does not preclude SARS-Cov-2 infection and should not be used as the sole basis for treatment or other patient management decisions. A  negative result may occur with  improper specimen collection/handling, submission of specimen other than nasopharyngeal swab, presence of viral mutation(s) within the areas targeted by this assay, and inadequate number of viral copies(<138 copies/mL). A negative result must be combined with clinical observations, patient history, and epidemiological information. The expected result is Negative.  Fact Sheet for Patients:  BloggerCourse.com  Fact Sheet for Healthcare Providers:  SeriousBroker.it  This test is no t yet approved or cleared by the [place]  FDA and  has been authorized for detection and/or diagnosis of SARS-CoV-2 by FDA under an Emergency Use Authorization (EUA). This EUA will remain  in effect (meaning this test can be used) for the duration of the COVID-19 declaration under Section 564(b)(1) of the Act, 21 U.S.C.section 360bbb-3(b)(1), unless the authorization is terminated  or revoked sooner.       Influenza A by PCR NEGATIVE NEGATIVE Final   Influenza B by PCR NEGATIVE NEGATIVE Final    Comment: (NOTE) The Xpert Xpress SARS-CoV-2/FLU/RSV plus assay is intended as an aid in the diagnosis of influenza from Nasopharyngeal swab specimens and should not be used as a sole basis for treatment. Nasal washings and aspirates are unacceptable for Xpert Xpress SARS-CoV-2/FLU/RSV testing.  Fact Sheet for Patients: BloggerCourse.com  Fact Sheet for Healthcare Providers: SeriousBroker.it  This test is not yet approved or cleared by the [place]  FDA and has been authorized for detection and/or diagnosis of SARS-CoV-2 by FDA under an Emergency Use Authorization (EUA). This EUA will remain in effect (meaning this test can be used) for the duration of the COVID-19 declaration under Section 564(b)(1) of the Act, 21 U.S.C. section 360bbb-3(b)(1), unless the authorization  is terminated or revoked.     Resp Syncytial Virus by PCR NEGATIVE NEGATIVE Final    Comment: (NOTE) Fact Sheet for Patients: BloggerCourse.com  Fact Sheet for Healthcare Providers: SeriousBroker.it  This test is not yet approved or cleared by the [place]  FDA and has been authorized for detection and/or diagnosis of SARS-CoV-2 by FDA under an Emergency Use Authorization (EUA). This EUA will remain in effect (meaning this test can be used) for the duration of the COVID-19 declaration under Section 564(b)(1) of the Act, 21 U.S.C. section 360bbb-3(b)(1), unless the authorization is terminated or revoked.  Performed at [facility], [address] [place], [place] [address]   Respiratory (~20 pathogens) panel by PCR     Status: None   Collection Time: 07/10/23  4:31 PM  Specimen: Nasopharyngeal Swab; Respiratory  Result Value Ref Range Status   Adenovirus NOT DETECTED NOT DETECTED Final   Coronavirus 229E NOT DETECTED NOT DETECTED Final    Comment: (NOTE) The Coronavirus on the Respiratory Panel, DOES NOT test for the novel  Coronavirus (2019 nCoV)    Coronavirus HKU1 NOT DETECTED NOT DETECTED Final   Coronavirus NL63 NOT DETECTED NOT DETECTED Final   Coronavirus OC43 NOT DETECTED NOT DETECTED Final   Metapneumovirus NOT DETECTED NOT DETECTED Final   Rhinovirus / Enterovirus NOT DETECTED NOT DETECTED Final   Influenza A NOT DETECTED NOT DETECTED Final   Influenza B NOT DETECTED NOT DETECTED Final   Parainfluenza Virus 1 NOT DETECTED NOT DETECTED Final   Parainfluenza Virus 2 NOT DETECTED NOT DETECTED Final   Parainfluenza Virus 3 NOT DETECTED NOT DETECTED Final   Parainfluenza Virus 4 NOT DETECTED NOT DETECTED Final   Respiratory Syncytial Virus NOT DETECTED NOT DETECTED Final   Bordetella pertussis NOT DETECTED NOT DETECTED Final   Bordetella Parapertussis NOT DETECTED NOT DETECTED Final   Chlamydophila  pneumoniae NOT DETECTED NOT DETECTED Final   Mycoplasma pneumoniae NOT DETECTED NOT DETECTED Final    Comment: Performed at [facility], [address], [place], [place] [address]         Radiology Studies: No results found.       Scheduled Meds:  azithromycin  500 mg Oral Q24H   carvedilol   12.5 mg Oral BID WC   Chlorhexidine Gluconate Cloth  6 each Topical Daily   heparin   5,000 Units Subcutaneous Q8H   insulin aspart  0-5 Units Subcutaneous QHS   insulin aspart  0-9 Units Subcutaneous TID WC   insulin glargine-yfgn  8 Units Subcutaneous Q24H   midodrine  5 mg Oral BID WC   Continuous Infusions:  cefTRIAXone (ROCEPHIN)  IV 2 g (07/12/23 1320)     LOS: 3 days    Tiajuana Fluke, MD [facility]   If 7PM-7AM, please contact night-coverage  07/13/2023, 11:04 AM

## 2023-07-13 NOTE — Progress Notes (Signed)
 PHARMACIST - PHYSICIAN COMMUNICATION DR:   Mosetta Areola CONCERNING: Antibiotic IV to Oral Route Change Policy  RECOMMENDATION: This patient is receiving Azithromycin  by the intravenous route.  Based on criteria approved by the Pharmacy and Therapeutics Committee, the antibiotic(s) is/are being converted to the equivalent oral dose form(s).   DESCRIPTION: These criteria include: Patient being treated for a respiratory tract infection, urinary tract infection, cellulitis or clostridium difficile associated diarrhea if on metronidazole The patient is not neutropenic and does not exhibit a GI malabsorption state The patient is eating (either orally or via tube) and/or has been taking other orally administered medications for a least 24 hours The patient is improving clinically and has a Tmax < 100.5  If you have questions about this conversion, please contact the Pharmacy Department  []   [phone] )  Cristine Done [x]   [phone] )  [facility] []   [phone] )  [facility]lin Benes []   [phone] )  [facility] []   [phone] )  [facility]

## 2023-07-13 NOTE — TOC Progression Note (Signed)
 Transition of Care [facility]) - Progression Note    Patient Details  Name: Jesse Torres MRN: [id number] Date of Birth: 1950-09-01  Transition of Care [facility]) CM/SW Contact  Odilia Bennett, LCSW Phone Number: 07/13/2023, 1:24 PM  Clinical Narrative:  Per OT, patient is requesting a RW. CSW ordered through [employer].   Expected Discharge Plan:  (TBD. Home.) Barriers to Discharge: Continued Medical Work up  Expected Discharge Plan and Services     Post Acute Care Choice:  (TBD) Living arrangements for the past 2 months: Mobile Home                                       Social Determinants of Health (SDOH) Interventions SDOH Screenings   Food Insecurity: No Food Insecurity (07/10/2023)  Housing: Low Risk  (07/10/2023)  Transportation Needs: No Transportation Needs (07/10/2023)  Utilities: Not At Risk (07/10/2023)  Depression (PHQ2-9): Low Risk  (06/27/2023)  Social Connections: Moderately Integrated (07/11/2023)  Tobacco Use: Medium Risk (07/10/2023)    Readmission Risk Interventions     No data to display

## 2023-07-14 DIAGNOSIS — A419 Sepsis, unspecified organism: Secondary | ICD-10-CM | POA: Diagnosis not present

## 2023-07-14 DIAGNOSIS — R6520 Severe sepsis without septic shock: Secondary | ICD-10-CM | POA: Diagnosis not present

## 2023-07-14 LAB — CBC
HCT: 35.7 % — ABNORMAL LOW (ref 39.0–52.0)
Hemoglobin: 11.8 g/dL — ABNORMAL LOW (ref 13.0–17.0)
MCH: 27.2 pg (ref 26.0–34.0)
MCHC: 33.1 g/dL (ref 30.0–36.0)
MCV: 82.3 fL (ref 80.0–100.0)
Platelets: 137 10*3/uL — ABNORMAL LOW (ref 150–400)
RBC: 4.34 MIL/uL (ref 4.22–5.81)
RDW: 15 % (ref 11.5–15.5)
WBC: 5 10*3/uL (ref 4.0–10.5)
nRBC: 0 % (ref 0.0–0.2)

## 2023-07-14 LAB — BASIC METABOLIC PANEL WITH GFR
Anion gap: 9 (ref 5–15)
BUN: 29 mg/dL — ABNORMAL HIGH (ref 8–23)
CO2: 24 mmol/L (ref 22–32)
Calcium: 8.4 mg/dL — ABNORMAL LOW (ref 8.9–10.3)
Chloride: 103 mmol/L (ref 98–111)
Creatinine, Ser: 1.16 mg/dL (ref 0.61–1.24)
GFR, Estimated: >60 mL/min (ref >60)
Glucose, Bld: 179 mg/dL — ABNORMAL HIGH (ref 70–99)
Potassium: 3.4 mmol/L — ABNORMAL LOW (ref 3.5–5.1)
Sodium: 136 mmol/L (ref 135–145)

## 2023-07-14 LAB — GLUCOSE, CAPILLARY
Glucose-Capillary: 209 mg/dL — ABNORMAL HIGH (ref 70–99)
Glucose-Capillary: 188 mg/dL — ABNORMAL HIGH (ref 70–99)

## 2023-07-14 NOTE — TOC Transition Note (Signed)
 Transition of Care [facility]) - Discharge Note   Patient Details  Name: Jesse Torres MRN: [id number] Date of Birth: 10-24-1950  Transition [facility]) CM/SW Contact:  Alexandra Ice, RN Phone Number: 07/14/2023, 3:03 PM   Clinical Narrative:     Met with patient at bedside, to discuss home health. He declined services. TOC explained that if he wanted after discharge he will need to contact his PCP, he verbalized understanding. MD aware. No additional TOC needs identified.  Final next level of care: Home/Self Care Barriers to Discharge: Barriers Resolved   Patient Goals and CMS Choice            Discharge Placement                  Name of family member notified: Francoise Ishihara Patient and family notified of of transfer: 07/14/23  Discharge Plan and Services Additional resources added to the After Visit Summary for       Post Acute Care Choice:  (TBD)            DME Agency: NA       HH Arranged: NA          Social Drivers of Health (SDOH) Interventions SDOH Screenings   Food Insecurity: No Food Insecurity (07/10/2023)  Housing: Low Risk  (07/10/2023)  Transportation Needs: No Transportation Needs (07/10/2023)  Utilities: Not At Risk (07/10/2023)  Depression (PHQ2-9): Low Risk  (06/27/2023)  Social Connections: Moderately Integrated (07/11/2023)  Tobacco Use: Medium Risk (07/10/2023)     Readmission Risk Interventions     No data to display

## 2023-07-14 NOTE — Discharge Summary (Signed)
 Physician Discharge Summary  Jesse Torres ZOX:[id number] DOB: 1950/03/12 DOA: 07/10/2023  PCP: Jolanda Nation, NP  Admit date: 07/10/2023 Discharge date: 07/14/2023  Admitted From: Home Disposition:  Home w home health  Recommendations for Outpatient Follow-up:  Follow up with PCP in 1-2 weeks   Home Health:Yes PT OT RN  Equipment/Devices:RW walker  Discharge Condition:Stable  CODE STATUS:Full  Diet recommendation: Heart healthy  Brief/Interim Summary:  73 year old male with hypertension, non-insulin -dependent diabetes mellitus, hyperlipidemia, pulmonary hypertension, heart failure preserved ejection fraction, obesity, no prior CKD who presents emergency department for chief concerns of weakness, dizziness, and poor p.o. intake.    After admission patient noted to have declining maps.  Transferred to ICU.  PCCM engaged.  Required low-dose Levophed  for short period of time.  Weaned off as of 5/28.     Discharge Diagnoses:  Principal Problem:   Severe sepsis with acute organ dysfunction (HCC) Active Problems:   Essential hypertension   Sleep apnea, obstructive   Obesity   Pulmonary hypertension (HCC)   RBBB (right bundle branch block)   Dyslipidemia   CAD (coronary artery disease)   Pure hypercholesterolemia   Type 2 diabetes mellitus without complication (HCC)   AKI (acute kidney injury) (HCC)   Hypothermia   CAP (community acquired pneumonia)  Septic shock Secondary to community-acquired pneumonia Shock physiology has improved.  Patient required Levophed  support for less than 12 hours.  Now weaned off as of 5/28 Weaned off midodrine  on 5/31 Plan: Stable for discharge home Completed course of abx in house Can resume Coreg .  Hold remainder of blood pressure regimen until 6/4  Essential hypertension Patient initially low normotensive Levophed  weaned off Midodrine  weaned off At time of discharge okay to resume Coreg  for rate control.  Hold remainder of GDMT until  6/4 including Lasix , Aldactone , lisinopril , amlodipine .  Blood pressure at time of discharge 116/65   AKI (acute kidney injury) (HCC) Suspect secondary to sepsis versus poor p.o. intake Resolved   Type 2 diabetes mellitus without complication (HCC) Non-insulin -dependent diabetes mellitus Resume metformin  on discharge    Discharge Instructions  Discharge Instructions     Diet - low sodium heart healthy   Complete by: As directed    Increase activity slowly   Complete by: As directed       Allergies as of 07/14/2023   No Known Allergies      Medication List     PAUSE taking these medications    amLODipine  2.5 MG tablet Wait to take this until: July 18, 2023 Commonly known as: NORVASC  Take 1 tablet (2.5 mg total) by mouth daily.   furosemide  20 MG tablet Wait to take this until: July 18, 2023 Commonly known as: LASIX  Take 1 tablet (20 mg total) by mouth daily.   lisinopril  40 MG tablet Wait to take this until: July 18, 2023 Commonly known as: ZESTRIL  Take 1 tablet (40 mg total) by mouth daily.   spironolactone  25 MG tablet Wait to take this until: July 18, 2023 Commonly known as: ALDACTONE  Take 1 tablet (25 mg total) by mouth daily.       TAKE these medications    Accu-Chek Guide Test test strip Generic drug: glucose blood Use as instructed to check blood sugar. Dx. E11.9   acetaminophen  500 MG tablet Commonly known as: TYLENOL  Take 1 tablet (500 mg total) by mouth every 6 (six) hours as needed.   carvedilol  12.5 MG tablet Commonly known as: COREG  Take 1 tablet (12.5 mg total) by mouth  2 (two) times daily with a meal.   ketorolac  0.5 % ophthalmic solution Commonly known as: ACULAR  Place 1 drop into the left eye daily.   metFORMIN  500 MG tablet Commonly known as: GLUCOPHAGE  Take 2 tablets (1,000 mg total) by mouth 2 (two) times daily with a meal.   simvastatin  5 MG tablet Commonly known as: ZOCOR  Take 1 tablet (5 mg total) by mouth daily.                Durable Medical Equipment  (From admission, onward)           Start     Ordered   07/13/23 1310  For home use only DME Walker rolling  Once       Question Answer Comment  Walker: With 5 Inch Wheels   Patient needs a walker to treat with the following condition Weakness      07/13/23 1309            Follow-up Information     Jolanda Nation, NP. Schedule an appointment as soon as possible for a visit in 1 week(s).   Specialty: General Practice Contact information: [address] [place] [address] [phone]                No Known Allergies  Consultations: PCCM   Procedures/Studies: ECHOCARDIOGRAM COMPLETE Result Date: 07/11/2023    ECHOCARDIOGRAM REPORT   Patient Name:   Jesse Torres Date of Exam: 07/10/2023 Medical Rec #:  [id number]       Height:       67.0 in Accession #:    [id number]      Weight:       195.1 lb Date of Birth:  1950/06/21        BSA:          2.001 m Patient Age:    72 years        BP:           105/47 mmHg Patient Gender: M               HR:           117 bpm. Exam Location:  [facility] Procedure: 2D Echo, Cardiac Doppler and Color Doppler (Both Spectral and Color            Flow Doppler were utilized during procedure). Indications:     I48.91 Atrial Fibrillation  History:         Patient has prior history of Echocardiogram examinations, most                  recent 09/11/2022. Cardiomyopathy; Risk Factors:Hypertension.                  Chronic systolic heart failure. Pulmonary hypertension.  Sonographer:     Brigid Canada RDCS Referring Phys:  [id number] Delanna Fears Diagnosing Phys: Antionette Kirks MD IMPRESSIONS  1. Left ventricular ejection fraction, by estimation, is 55 to 60%. The left ventricle has normal function. Left ventricular endocardial border not optimally defined to evaluate regional wall motion. Left ventricular diastolic parameters are indeterminate.  2. Right ventricular systolic function is normal. The  right ventricular size is normal. There is normal pulmonary artery systolic pressure.  3. The mitral valve is normal in structure. No evidence of mitral valve regurgitation. No evidence of mitral stenosis.  4. The aortic valve is normal in structure. Aortic valve regurgitation is not visualized. No aortic stenosis is present.  5.  The inferior vena cava is normal in size with greater than 50% respiratory variability, suggesting right atrial pressure of 3 mmHg. FINDINGS  Left Ventricle: Left ventricular ejection fraction, by estimation, is 55 to 60%. The left ventricle has normal function. Left ventricular endocardial border not optimally defined to evaluate regional wall motion. The left ventricular internal cavity size was normal in size. There is borderline left ventricular hypertrophy. Left ventricular diastolic parameters are indeterminate. Right Ventricle: The right ventricular size is normal. No increase in right ventricular wall thickness. Right ventricular systolic function is normal. There is normal pulmonary artery systolic pressure. The tricuspid regurgitant velocity is 2.69 m/s, and  with an assumed right atrial pressure of 3 mmHg, the estimated right ventricular systolic pressure is 31.9 mmHg. Left Atrium: Left atrial size was normal in size. Right Atrium: Right atrial size was normal in size. Pericardium: There is no evidence of pericardial effusion. Mitral Valve: The mitral valve is normal in structure. No evidence of mitral valve regurgitation. No evidence of mitral valve stenosis. Tricuspid Valve: The tricuspid valve is normal in structure. Tricuspid valve regurgitation is trivial. No evidence of tricuspid stenosis. Aortic Valve: The aortic valve is normal in structure. Aortic valve regurgitation is not visualized. No aortic stenosis is present. Pulmonic Valve: The pulmonic valve was normal in structure. Pulmonic valve regurgitation is not visualized. No evidence of pulmonic stenosis. Aorta: The  aortic root is normal in size and structure. Venous: The inferior vena cava is normal in size with greater than 50% respiratory variability, suggesting right atrial pressure of 3 mmHg. IAS/Shunts: No atrial level shunt detected by color flow Doppler.  LEFT VENTRICLE PLAX 2D LVIDd:         5.10 cm LVIDs:         3.00 cm LV PW:         0.90 cm LV IVS:        0.90 cm LVOT diam:     2.10 cm LV SV:         36 LV SV Index:   18 LVOT Area:     3.46 cm  RIGHT VENTRICLE             IVC RV Basal diam:  3.20 cm     IVC diam: 1.80 cm RV S prime:     11.42 cm/s TAPSE (M-mode): 1.9 cm LEFT ATRIUM             Index        RIGHT ATRIUM           Index LA diam:        3.90 cm 1.95 cm/m   RA Area:     13.00 cm LA Vol (A2C):   24.3 ml 12.14 ml/m  RA Volume:   32.20 ml  16.09 ml/m LA Vol (A4C):   33.9 ml 16.94 ml/m LA Biplane Vol: 29.7 ml 14.84 ml/m  AORTIC VALVE LVOT Vmax:   72.00 cm/s LVOT Vmean:  51.867 cm/s LVOT VTI:    0.103 m  AORTA Ao Root diam: 3.60 cm MV E velocity: 92.18 cm/s  TRICUSPID VALVE                            TR Peak grad:   28.9 mmHg                            TR Vmax:  269.00 cm/s                             SHUNTS                            Systemic VTI:  0.10 m                            Systemic Diam: 2.10 cm Antionette Kirks MD Electronically signed by Antionette Kirks MD Signature Date/Time: 07/11/2023/11:28:52 AM    Final    DG Chest Portable 1 View Result Date: 07/10/2023 CLINICAL DATA:  Weakness. EXAM: PORTABLE CHEST 1 VIEW COMPARISON:  Chest radiograph dated 02/04/2013. FINDINGS: Right mid to lower lung field opacity concerning for developing infiltrate. The left lung is clear. No large pleural effusion or pneumothorax. Stable cardiac silhouette. No acute osseous pathology. IMPRESSION: Right mid to lower lung field opacity concerning for developing infiltrate. Electronically Signed   By: Angus Bark M.D.   On: 07/10/2023 14:45      Subjective: Seen and examined on the day of  discharge.  Stable no distress.  Appropriate for discharge home.  Discharge Exam: Vitals:   07/14/23 0802 07/14/23 1441  BP: (!) 128/90 116/65  Pulse: 72 70  Resp: 16 16  Temp: 97.8 F (36.6 C) 97.7 F (36.5 C)  SpO2: 100% 100%   Vitals:   07/13/23 2027 07/14/23 0400 07/14/23 0802 07/14/23 1441  BP: 124/71 116/74 (!) 128/90 116/65  Pulse: 64 84 72 70  Resp: 18 18 16 16   Temp: 98.5 F (36.9 C) 97.7 F (36.5 C) 97.8 F (36.6 C) 97.7 F (36.5 C)  TempSrc: Oral Oral Oral   SpO2: 98%  100% 100%  Weight:      Height:        General: Pt is alert, awake, not in acute distress Cardiovascular: RRR, S1/S2 +, no rubs, no gallops Respiratory: CTA bilaterally, no wheezing, no rhonchi Abdominal: Soft, NT, ND, bowel sounds + Extremities: no edema, no cyanosis    The results of significant diagnostics from this hospitalization (including imaging, microbiology, ancillary and laboratory) are listed below for reference.     Microbiology: Recent Results (from the past 240 hours)  Culture, blood (routine x 2)     Status: None (Preliminary result)   Collection Time: 07/10/23 11:15 AM   Specimen: Right Antecubital; Blood  Result Value Ref Range Status   Specimen Description RIGHT ANTECUBITAL  Final   Special Requests   Final    BOTTLES DRAWN AEROBIC AND ANAEROBIC Blood Culture adequate volume   Culture   Final    NO GROWTH 3 DAYS Performed at [facility], [address] [place], [place] [address]    Report Status PENDING  Incomplete  Culture, blood (routine x 2)     Status: None (Preliminary result)   Collection Time: 07/10/23 11:28 AM   Specimen: BLOOD  Result Value Ref Range Status   Specimen Description BLOOD LEFT ANTECUBITAL  Final   Special Requests   Final    BOTTLES DRAWN AEROBIC AND ANAEROBIC Blood Culture results may not be optimal due to an inadequate volume of blood received in culture bottles   Culture   Final    NO GROWTH 3 DAYS Performed at [facility], [address], [place], [place] [address]    Report Status PENDING  Incomplete  MRSA Next Gen by PCR, Nasal     Status: None   Collection Time: 07/10/23  4:31 PM   Specimen: Nasal Swab  Result Value Ref Range Status   MRSA by PCR Next Gen NOT DETECTED NOT DETECTED Final    Comment: (NOTE) The GeneXpert MRSA Assay (FDA approved for NASAL specimens only), is one component of a comprehensive MRSA colonization surveillance program. It is not intended to diagnose MRSA infection nor to guide or monitor treatment for MRSA infections. Test performance is not FDA approved in patients less than 42 years old. Performed at [facility], [address] [place], [place] [address]   Resp panel by RT-PCR (RSV, Flu A&B, Covid)     Status: None   Collection Time: 07/10/23  4:31 PM   Specimen: Nasal Swab  Result Value Ref Range Status   SARS Coronavirus 2 by RT PCR NEGATIVE NEGATIVE Final    Comment: (NOTE) SARS-CoV-2 target nucleic acids are NOT DETECTED.  The SARS-CoV-2 RNA is generally detectable in upper respiratory specimens during the acute phase of infection. The lowest concentration of SARS-CoV-2 viral copies this assay can detect is 138 copies/mL. A negative result does not preclude SARS-Cov-2 infection and should not be used as the sole basis for treatment or other patient management decisions. A negative result may occur with  improper specimen collection/handling, submission of specimen other than nasopharyngeal swab, presence of viral mutation(s) within the areas targeted by this assay, and inadequate number of viral copies(<138 copies/mL). A negative result must be combined with clinical observations, patient history, and epidemiological information. The expected result is Negative.  Fact Sheet for Patients:  BloggerCourse.com  Fact Sheet for Healthcare Providers:  SeriousBroker.it  This test is no t  yet approved or cleared by the [place]  FDA and  has been authorized for detection and/or diagnosis of SARS-CoV-2 by FDA under an Emergency Use Authorization (EUA). This EUA will remain  in effect (meaning this test can be used) for the duration of the COVID-19 declaration under Section 564(b)(1) of the Act, 21 U.S.C.section 360bbb-3(b)(1), unless the authorization is terminated  or revoked sooner.       Influenza A by PCR NEGATIVE NEGATIVE Final   Influenza B by PCR NEGATIVE NEGATIVE Final    Comment: (NOTE) The Xpert Xpress SARS-CoV-2/FLU/RSV plus assay is intended as an aid in the diagnosis of influenza from Nasopharyngeal swab specimens and should not be used as a sole basis for treatment. Nasal washings and aspirates are unacceptable for Xpert Xpress SARS-CoV-2/FLU/RSV testing.  Fact Sheet for Patients: BloggerCourse.com  Fact Sheet for Healthcare Providers: SeriousBroker.it  This test is not yet approved or cleared by the [place]  FDA and has been authorized for detection and/or diagnosis of SARS-CoV-2 by FDA under an Emergency Use Authorization (EUA). This EUA will remain in effect (meaning this test can be used) for the duration of the COVID-19 declaration under Section 564(b)(1) of the Act, 21 U.S.C. section 360bbb-3(b)(1), unless the authorization is terminated or revoked.     Resp Syncytial Virus by PCR NEGATIVE NEGATIVE Final    Comment: (NOTE) Fact Sheet for Patients: BloggerCourse.com  Fact Sheet for Healthcare Providers: SeriousBroker.it  This test is not yet approved or cleared by the [place]  FDA and has been authorized for detection and/or diagnosis of SARS-CoV-2 by FDA under an Emergency Use Authorization (EUA). This EUA will remain in effect (meaning this test can be used) for the duration of the COVID-19 declaration under Section 564(b)(1)  of the Act, 21 U.S.C. section 360bbb-3(b)(1), unless the authorization is terminated or revoked.  Performed at [facility], [address] [place], [place] [address]   Respiratory (~20 pathogens) panel by PCR     Status: None   Collection Time: 07/10/23  4:31 PM   Specimen: Nasopharyngeal Swab; Respiratory  Result Value Ref Range Status   Adenovirus NOT DETECTED NOT DETECTED Final   Coronavirus 229E NOT DETECTED NOT DETECTED Final    Comment: (NOTE) The Coronavirus on the Respiratory Panel, DOES NOT test for the novel  Coronavirus (2019 nCoV)    Coronavirus HKU1 NOT DETECTED NOT DETECTED Final   Coronavirus NL63 NOT DETECTED NOT DETECTED Final   Coronavirus OC43 NOT DETECTED NOT DETECTED Final   Metapneumovirus NOT DETECTED NOT DETECTED Final   Rhinovirus / Enterovirus NOT DETECTED NOT DETECTED Final   Influenza A NOT DETECTED NOT DETECTED Final   Influenza B NOT DETECTED NOT DETECTED Final   Parainfluenza Virus 1 NOT DETECTED NOT DETECTED Final   Parainfluenza Virus 2 NOT DETECTED NOT DETECTED Final   Parainfluenza Virus 3 NOT DETECTED NOT DETECTED Final   Parainfluenza Virus 4 NOT DETECTED NOT DETECTED Final   Respiratory Syncytial Virus NOT DETECTED NOT DETECTED Final   Bordetella pertussis NOT DETECTED NOT DETECTED Final   Bordetella Parapertussis NOT DETECTED NOT DETECTED Final   Chlamydophila pneumoniae NOT DETECTED NOT DETECTED Final   Mycoplasma pneumoniae NOT DETECTED NOT DETECTED Final    Comment: Performed at [facility], [address], [place], [place] [address]     Labs: BNP (last 3 results) Recent Labs    07/10/23 1109  BNP 27.5   Basic Metabolic Panel: Recent Labs  Lab 07/10/23 1109 07/11/23 0545 07/12/23 0422 07/13/23 0417 07/14/23 0441  NA 132* 137 139 140 136  K 4.1 4.3 3.6 3.7 3.4*  CL 98 105 107 106 103  CO2 20* 25 24 24 24   GLUCOSE 389* 263* 181* 174* 179*  BUN 88* 70* 46* 38* 29*  CREATININE 2.11* 1.54* 1.17 1.18 1.16   CALCIUM 7.9* 8.2* 8.1* 8.6* 8.4*  MG 3.1* 2.7*  --   --   --   PHOS  --  2.9  --   --   --    Liver Function Tests: Recent Labs  Lab 07/10/23 1109  AST 31  ALT 21  ALKPHOS 48  BILITOT 1.2  PROT 6.2*  ALBUMIN  2.5*   No results for input(s): "LIPASE", "AMYLASE" in the last 168 hours. No results for input(s): "AMMONIA" in the last 168 hours. CBC: Recent Labs  Lab 07/10/23 1109 07/10/23 1644 07/11/23 0545 07/12/23 0422 07/13/23 0417 07/14/23 0441  WBC 4.4 3.2* 3.3* 3.3* 5.1 5.0  NEUTROABS 3.7 2.7  --   --   --   --   HGB 12.4* 11.5* 11.6* 11.5* 12.6* 11.8*  HCT 37.5* 34.7* 34.8* 35.1* 39.2 35.7*  MCV 82.6 83.6 82.5 82.2 83.6 82.3  PLT 63* 58* 62* 72* 110* 137*   Cardiac Enzymes: Recent Labs  Lab 07/10/23 1644  CKTOTAL 119   BNP: Invalid input(s): "POCBNP" CBG: Recent Labs  Lab 07/13/23 1152 07/13/23 1608 07/13/23 2103 07/14/23 0800 07/14/23 1138  GLUCAP 233* 211* 216* 209* 188*   D-Dimer No results for input(s): "DDIMER" in the last 72 hours. Hgb A1c No results for input(s): "HGBA1C" in the last 72 hours. Lipid Profile No results for input(s): "CHOL", "HDL", "LDLCALC", "TRIG", "CHOLHDL", "LDLDIRECT" in the last 72 hours. Thyroid  function studies  No results for input(s): "TSH", "T4TOTAL", "T3FREE", "THYROIDAB" in the last 72 hours.  Invalid input(s): "FREET3" Anemia work up No results for input(s): "VITAMINB12", "FOLATE", "FERRITIN", "TIBC", "IRON", "RETICCTPCT" in the last 72 hours. Urinalysis    Component Value Date/Time   COLORURINE YELLOW (A) 07/10/2023 1828   APPEARANCEUR HAZY (A) 07/10/2023 1828   LABSPEC 1.014 07/10/2023 1828   PHURINE 5.0 07/10/2023 1828   GLUCOSEU >=500 (A) 07/10/2023 1828   HGBUR NEGATIVE 07/10/2023 1828   BILIRUBINUR NEGATIVE 07/10/2023 1828   KETONESUR NEGATIVE 07/10/2023 1828   PROTEINUR NEGATIVE 07/10/2023 1828   NITRITE NEGATIVE 07/10/2023 1828   LEUKOCYTESUR NEGATIVE 07/10/2023 1828   Sepsis Labs Recent Labs   Lab 07/11/23 0545 07/12/23 0422 07/13/23 0417 07/14/23 0441  WBC 3.3* 3.3* 5.1 5.0   Microbiology Recent Results (from the past 240 hours)  Culture, blood (routine x 2)     Status: None (Preliminary result)   Collection Time: 07/10/23 11:15 AM   Specimen: Right Antecubital; Blood  Result Value Ref Range Status   Specimen Description RIGHT ANTECUBITAL  Final   Special Requests   Final    BOTTLES DRAWN AEROBIC AND ANAEROBIC Blood Culture adequate volume   Culture   Final    NO GROWTH 3 DAYS Performed at [facility], [address] [place], [place] [address]    Report Status PENDING  Incomplete  Culture, blood (routine x 2)     Status: None (Preliminary result)   Collection Time: 07/10/23 11:28 AM   Specimen: BLOOD  Result Value Ref Range Status   Specimen Description BLOOD LEFT ANTECUBITAL  Final   Special Requests   Final    BOTTLES DRAWN AEROBIC AND ANAEROBIC Blood Culture results may not be optimal due to an inadequate volume of blood received in culture bottles   Culture   Final    NO GROWTH 3 DAYS Performed at [facility], [address] [place], [place] [address]    Report Status PENDING  Incomplete  MRSA Next Gen by PCR, Nasal     Status: None   Collection Time: 07/10/23  4:31 PM   Specimen: Nasal Swab  Result Value Ref Range Status   MRSA by PCR Next Gen NOT DETECTED NOT DETECTED Final    Comment: (NOTE) The GeneXpert MRSA Assay (FDA approved for NASAL specimens only), is one component of a comprehensive MRSA colonization surveillance program. It is not intended to diagnose MRSA infection nor to guide or monitor treatment for MRSA infections. Test performance is not FDA approved in patients less than 77 years old. Performed at [facility], [address] [place], [place] [address]   Resp panel by RT-PCR (RSV, Flu A&B, Covid)     Status: None   Collection Time: 07/10/23  4:31 PM   Specimen: Nasal Swab  Result Value Ref Range  Status   SARS Coronavirus 2 by RT PCR NEGATIVE NEGATIVE Final    Comment: (NOTE) SARS-CoV-2 target nucleic acids are NOT DETECTED.  The SARS-CoV-2 RNA is generally detectable in upper respiratory specimens during the acute phase of infection. The lowest concentration of SARS-CoV-2 viral copies this assay can detect is 138 copies/mL. A negative result does not preclude SARS-Cov-2 infection and should not be used as the sole basis for treatment or other patient management decisions. A negative result may occur with  improper specimen collection/handling, submission of specimen other than nasopharyngeal swab, presence of viral mutation(s) within the areas targeted by this assay, and inadequate number of  viral copies(<138 copies/mL). A negative result must be combined with clinical observations, patient history, and epidemiological information. The expected result is Negative.  Fact Sheet for Patients:  BloggerCourse.com  Fact Sheet for Healthcare Providers:  SeriousBroker.it  This test is no t yet approved or cleared by the [place]  FDA and  has been authorized for detection and/or diagnosis of SARS-CoV-2 by FDA under an Emergency Use Authorization (EUA). This EUA will remain  in effect (meaning this test can be used) for the duration of the COVID-19 declaration under Section 564(b)(1) of the Act, 21 U.S.C.section 360bbb-3(b)(1), unless the authorization is terminated  or revoked sooner.       Influenza A by PCR NEGATIVE NEGATIVE Final   Influenza B by PCR NEGATIVE NEGATIVE Final    Comment: (NOTE) The Xpert Xpress SARS-CoV-2/FLU/RSV plus assay is intended as an aid in the diagnosis of influenza from Nasopharyngeal swab specimens and should not be used as a sole basis for treatment. Nasal washings and aspirates are unacceptable for Xpert Xpress SARS-CoV-2/FLU/RSV testing.  Fact Sheet for  Patients: BloggerCourse.com  Fact Sheet for Healthcare Providers: SeriousBroker.it  This test is not yet approved or cleared by the [place]  FDA and has been authorized for detection and/or diagnosis of SARS-CoV-2 by FDA under an Emergency Use Authorization (EUA). This EUA will remain in effect (meaning this test can be used) for the duration of the COVID-19 declaration under Section 564(b)(1) of the Act, 21 U.S.C. section 360bbb-3(b)(1), unless the authorization is terminated or revoked.     Resp Syncytial Virus by PCR NEGATIVE NEGATIVE Final    Comment: (NOTE) Fact Sheet for Patients: BloggerCourse.com  Fact Sheet for Healthcare Providers: SeriousBroker.it  This test is not yet approved or cleared by the [place]  FDA and has been authorized for detection and/or diagnosis of SARS-CoV-2 by FDA under an Emergency Use Authorization (EUA). This EUA will remain in effect (meaning this test can be used) for the duration of the COVID-19 declaration under Section 564(b)(1) of the Act, 21 U.S.C. section 360bbb-3(b)(1), unless the authorization is terminated or revoked.  Performed at [facility], [address] [place], [place] [address]   Respiratory (~20 pathogens) panel by PCR     Status: None   Collection Time: 07/10/23  4:31 PM   Specimen: Nasopharyngeal Swab; Respiratory  Result Value Ref Range Status   Adenovirus NOT DETECTED NOT DETECTED Final   Coronavirus 229E NOT DETECTED NOT DETECTED Final    Comment: (NOTE) The Coronavirus on the Respiratory Panel, DOES NOT test for the novel  Coronavirus (2019 nCoV)    Coronavirus HKU1 NOT DETECTED NOT DETECTED Final   Coronavirus NL63 NOT DETECTED NOT DETECTED Final   Coronavirus OC43 NOT DETECTED NOT DETECTED Final   Metapneumovirus NOT DETECTED NOT DETECTED Final   Rhinovirus / Enterovirus NOT DETECTED NOT  DETECTED Final   Influenza A NOT DETECTED NOT DETECTED Final   Influenza B NOT DETECTED NOT DETECTED Final   Parainfluenza Virus 1 NOT DETECTED NOT DETECTED Final   Parainfluenza Virus 2 NOT DETECTED NOT DETECTED Final   Parainfluenza Virus 3 NOT DETECTED NOT DETECTED Final   Parainfluenza Virus 4 NOT DETECTED NOT DETECTED Final   Respiratory Syncytial Virus NOT DETECTED NOT DETECTED Final   Bordetella pertussis NOT DETECTED NOT DETECTED Final   Bordetella Parapertussis NOT DETECTED NOT DETECTED Final   Chlamydophila pneumoniae NOT DETECTED NOT DETECTED Final   Mycoplasma pneumoniae NOT DETECTED NOT DETECTED Final    Comment: Performed at [facility]  [facility], [address], [place], [place] [address]     Time coordinating discharge: Over 30 minutes  SIGNED:   Tiajuana Fluke, MD  [facility] 07/14/2023, 2:51 PM Pager   If 7PM-7AM, please contact night-coverage

## 2023-07-15 LAB — CULTURE, BLOOD (ROUTINE X 2)
Culture: NO GROWTH
Culture: NO GROWTH
Special Requests: ADEQUATE

## 2023-07-20 ENCOUNTER — Encounter: Payer: Self-pay | Admitting: General Practice

## 2023-07-20 ENCOUNTER — Ambulatory Visit (INDEPENDENT_AMBULATORY_CARE_PROVIDER_SITE_OTHER): Admitting: General Practice

## 2023-07-20 VITALS — BP 118/70 | HR 55 | Temp 97.8°F | Ht 67.0 in | Wt 204.0 lb

## 2023-07-20 DIAGNOSIS — Z09 Encounter for follow-up examination after completed treatment for conditions other than malignant neoplasm: Secondary | ICD-10-CM | POA: Diagnosis not present

## 2023-07-20 DIAGNOSIS — R42 Dizziness and giddiness: Secondary | ICD-10-CM | POA: Diagnosis not present

## 2023-07-20 DIAGNOSIS — Z7984 Long term (current) use of oral hypoglycemic drugs: Secondary | ICD-10-CM

## 2023-07-20 DIAGNOSIS — Z7985 Long-term (current) use of injectable non-insulin antidiabetic drugs: Secondary | ICD-10-CM | POA: Diagnosis not present

## 2023-07-20 DIAGNOSIS — E119 Type 2 diabetes mellitus without complications: Secondary | ICD-10-CM

## 2023-07-20 DIAGNOSIS — H6690 Otitis media, unspecified, unspecified ear: Secondary | ICD-10-CM | POA: Diagnosis not present

## 2023-07-20 DIAGNOSIS — E1165 Type 2 diabetes mellitus with hyperglycemia: Secondary | ICD-10-CM | POA: Diagnosis not present

## 2023-07-20 MED ORDER — AMOXICILLIN-POT CLAVULANATE 875-125 MG PO TABS: 1.0000 | ORAL_TABLET | Freq: Two times a day (BID) | ORAL | 0 refills | Status: AC

## 2023-07-20 MED ORDER — TIRZEPATIDE 2.5 MG/0.5ML ~~LOC~~ SOAJ: 2.5000 mg | SUBCUTANEOUS | 0 refills | Status: DC

## 2023-07-20 MED ORDER — MECLIZINE HCL 12.5 MG PO TABS: 12.5000 mg | ORAL_TABLET | Freq: Three times a day (TID) | ORAL | 0 refills | Status: AC | PRN

## 2023-07-20 NOTE — Assessment & Plan Note (Signed)
 Improving. Has resumed all his medications as prescribed. Reviewed all hospital notes, labs and echocardiogram. Reviewed CHF plan of action with patient and his daughter.  Verbalized understanding.

## 2023-07-20 NOTE — Assessment & Plan Note (Addendum)
 Uncontrolled.  Discussed at length with patient and his daughter. Continue metformin  1000 mg twice daily. Start Mounjaro 2.5 mg once a week.  Rx sent. Discussed side effects with patient and daughter.  Verbalizes understanding.  Keep follow-up as scheduled at the end of the month. Urine ACR at next visit along with foot exam and hemoglobin A1c.

## 2023-07-20 NOTE — Progress Notes (Signed)
 Established Patient Office Visit  Subjective   Patient ID: Jesse Torres, male    DOB: November 08, 1950  Age: 73 y.o. MRN: [id number]  Chief Complaint  Patient presents with   Hospitalization Follow-up    Pt complains of feeling a little better since ER visit. Pt states of ongoing dizziness and slight ear pain. If he lays on his left side "room spins" request medication for ears. Pt complains of not having full balance back yet.    leg and feet swelling    Pt complains of both leg and feet swelling. No tingling or numbness.     HPI  Jesse Torres is a 73 year old male with past medical history of hypertension, right vertebral particular dysfunction, pulmonary hypertension, heart failure, CAD, OSA, type 2 diabetes, obesity, dyslipidemia presents today for hospital follow-up.  His daughter, Jesse Torres, is also present today.   Hospital f/u- 07/10/23-07/14/23   Hospital summary:    Septic shock Secondary to community-acquired pneumonia Shock physiology has improved.  Patient required Levophed  support for less than 12 hours.  Now weaned off as of 5/28 Weaned off midodrine  on 5/31 Plan: Stable for discharge home Completed course of abx in house Can resume Coreg .  Hold remainder of blood pressure regimen until 6/4   Essential hypertension Patient initially low normotensive Levophed  weaned off Midodrine  weaned off At time of discharge okay to resume Coreg  for rate control.  Hold remainder of GDMT until 6/4 including Lasix , Aldactone , lisinopril , amlodipine .  Blood pressure at time of discharge 116/65   AKI (acute kidney injury) (HCC) Suspect secondary to sepsis versus poor p.o. intake Resolved   Type 2 diabetes mellitus without complication (HCC) Non-insulin -dependent diabetes mellitus Resume metformin  on discharge  Today he reports, overall doing much better.  He has been having bilateral feet and leg swelling. First day after discharge, his daughter reports that his legs were so swollen  that he couldn't get his shoes on. Today the swelling is better but still there. They restarted the lasix  and spironolactone  25 mg once daily. He denies any chest pain or shortness of breath.  His daughter has bought him a recliner to ensure that he is able to elevate his legs while sitting.    Dizziness: Prior to going to the hospital he was evaluated at the ENT office where he had cerumen impaction.  He had really low blood pressure hence they did not finish that and patient was advised to go to the ER where he was found to be septic.  Today he reports that the dizziness is still there and he feels like the room is spinning.  He has a history of vertigo and this seems very similar to his previous episodes related to vertigo.  He does not have dizziness while standing up most of the time the dizziness only occurs while sitting or while laying on his left ear.  He denies any hearing loss, ear pain, fever, chills, nausea or vomiting.  Type 2 diabetes: His hemoglobin A1c was 9.6 on 05/02/2023.  This was not repeated while he was in the hospital however he was managed on insulin  sliding scale during his hospitalization.  He has resumed his metformin  500 mg twice daily.  He has been checking his blood sugars at home and the fasting readings run between 150-200.  He denies any hypoglycemic events.  He denies any polyphasia, polydipsia or polyuria.  Patient Active Problem List   Diagnosis Date Noted   Hospital discharge follow-up 07/20/2023   Acute otitis  media 07/20/2023   Vertigo 07/20/2023   Severe sepsis with acute organ dysfunction (HCC) 07/10/2023   AKI (acute kidney injury) (HCC) 07/10/2023   Hypothermia 07/10/2023   CAP (community acquired pneumonia) 07/10/2023   Establishing care with new doctor, encounter for 06/18/2023   Screening for colon cancer 06/18/2023   Uncontrolled type 2 diabetes mellitus with hyperglycemia (HCC) 09/26/2021   Pure hypercholesterolemia 09/25/2021   Cystoid macular  edema of left eye 12/08/2019   Postherpetic neuralgia 02/04/2015   PVC's (premature ventricular contractions) 02/16/2014   CAD (coronary artery disease) 05/05/2013   Dyslipidemia 03/11/2013   Cardiomyopathy (HCC)    HFimpEF (heart failure with improved EF    Thrombocytopenia (HCC)    Snoring    Right ventricular dysfunction 02/06/2013   Pulmonary hypertension (HCC) 02/06/2013   RBBB (right bundle branch block) 02/06/2013   Essential hypertension 02/05/2013   Sleep apnea, obstructive 02/05/2013   Obesity 02/05/2013   Past Medical History:  Diagnosis Date   Cardiomyopathy (HCC)    probable HTN cardiomyopathy;  Lexiscan  Myoview (02/07/2013): EF 34%, old small apical infarct, no ischemia, moderate apical hypokinesis.   Chronic systolic CHF (congestive heart failure) (HCC)    a. Echocardiogram (02/05/2013): Mild LVH, EF 25-30%, diffuse HK, mild MR, mild LAE, mild RVE, moderately reduced RVSF, mild RAE, PASP 64, trivial effusion.;  b.  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   Vitrectomy 02-18-2020   Vitreous membranes and strands, left 02/19/2020   Contributing to pseudophakic CME in a scleral fixated posterior chamber lens  Vitrectomy 02-18-20, removal of cortical vitreous   Past Surgical History:  Procedure Laterality Date   Cataract surgery     No Known Allergies       06/27/2023    2:04 PM 06/18/2023   12:23 PM 08/08/2018    3:52 PM  Depression screen PHQ 2/9  Decreased Interest 0 0 0  Down, Depressed, Hopeless 0 0 0  PHQ - 2 Score 0 0 0  Altered sleeping  0 0  Tired, decreased energy  0 0  Change in appetite  0 0  Feeling  bad or failure about yourself   0 0  Trouble concentrating  0 0  Moving slowly or fidgety/restless  0 0  Suicidal thoughts  0 0  PHQ-9 Score  0 0  Difficult doing work/chores  Not difficult at all Not difficult at all       06/18/2023   12:24 PM 08/08/2018    3:53 PM 04/08/2015   11:39 AM  GAD 7 : Generalized Anxiety Score  Nervous, Anxious, on Edge 0 0 0  Control/stop worrying 0 0 0  Worry too much - different things 0 0 0  Trouble relaxing 0 0 0  Restless 0 0 0  Easily annoyed or irritable 0 0 0  Afraid - awful might happen 0 0 0  Total GAD 7 Score 0 0 0  Anxiety Difficulty Not difficult at all Not difficult at all Not difficult at all      Review of Systems  Constitutional:  Negative for chills and fever.  HENT:  Negative for congestion, ear pain, hearing loss, sinus pain, sore throat and tinnitus.   Respiratory:  Negative for shortness of breath.   Cardiovascular:  Positive for leg swelling. Negative for chest pain.  Gastrointestinal:  Negative for abdominal pain, constipation, diarrhea, heartburn, nausea and vomiting.  Genitourinary:  Negative for dysuria, frequency and urgency.  Neurological:  Positive for dizziness. Negative for headaches.  Endo/Heme/Allergies:  Negative for polydipsia.  Psychiatric/Behavioral:  Negative for depression and suicidal ideas. The patient is not nervous/anxious.       Objective:     BP 118/70   Pulse (!) 55   Temp 97.8 F (36.6 C) (Oral)   Ht 5\' 7"  (1.702 m)   Wt 204 lb (92.5 kg)   SpO2 98%   BMI 31.95 kg/m  BP Readings from Last 3 Encounters:  07/20/23 118/70  07/14/23 116/65  06/27/23 133/70   Wt Readings from Last 3 Encounters:  07/20/23 204 lb (92.5 kg)  07/10/23 195 lb 1.7 oz (88.5 kg)  06/27/23 199 lb (90.3 kg)      Physical Exam Vitals and nursing note reviewed.  Constitutional:      Appearance: Normal appearance.  HENT:     Right Ear: Tympanic membrane is erythematous and retracted.     Left Ear: Tympanic  membrane is erythematous and retracted.  Cardiovascular:     Rate and Rhythm: Normal rate and regular rhythm.     Pulses: Normal pulses.     Heart sounds: Normal heart sounds.  Pulmonary:     Effort: Pulmonary effort is normal.     Breath sounds: Normal breath sounds.  Musculoskeletal:     Right lower leg: 1+ Pitting Edema present.     Left lower leg: 1+ Pitting Edema present.  Neurological:     Mental Status: He is alert and oriented to person, place, and time.  Psychiatric:        Mood and Affect: Mood normal.        Behavior: Behavior normal.        Thought Content: Thought content normal.        Judgment: Judgment normal.      No results found for any visits on 07/20/23.     The ASCVD Risk score (Arnett DK, et al., 2019) failed to calculate for the following reasons:   The valid total cholesterol range is 130 to 320 mg/dL    Assessment & Plan:  Hospital discharge follow-up Assessment & Plan: Improving. Has resumed all his medications as prescribed. Reviewed all hospital notes, labs and echocardiogram. Reviewed CHF plan of action with patient and his daughter.  Verbalized understanding.   Type 2 diabetes mellitus without complication, without long-term current use of insulin  (HCC) Assessment & Plan: Uncontrolled.  Discussed at length with patient and his daughter. Continue metformin  1000 mg twice daily. Start Mounjaro 2.5 mg once a week.  Rx sent. Discussed side effects with patient and daughter.  Verbalizes understanding.  Keep follow-up as scheduled at the end of the month. Urine ACR at next visit along with foot exam and hemoglobin A1c.  Orders: -     Tirzepatide; Inject 2.5 mg into the skin once a week.  Dispense: 2 mL; Refill: 0  Vertigo Assessment & Plan: Stable for outpatient treatment. Discussed safety precautions. Start meclizine 12.5 mg 3 times daily as needed for dizziness.  Schedule follow-up if symptoms do not improve or  worsen.  Orders: -  Meclizine HCl; Take 1 tablet (12.5 mg total) by mouth 3 (three) times daily as needed for dizziness.  Dispense: 30 tablet; Refill: 0  Acute otitis media, unspecified otitis media type Assessment & Plan: Symptoms suggestive of acute otitis media Start Augmentin twice daily x 7 days.  Rx sent.  Discussed with patient and daughter and advised to schedule follow-up with the ENT if not better.  Orders: -     Amoxicillin-Pot Clavulanate; Take 1 tablet by mouth 2 (two) times daily for 7 days.  Dispense: 14 tablet; Refill: 0  Uncontrolled type 2 diabetes mellitus with hyperglycemia (HCC) Assessment & Plan: Uncontrolled.  Discussed at length with patient and his daughter. Continue metformin  1000 mg twice daily. Start Mounjaro 2.5 mg once a week.  Rx sent. Discussed side effects with patient and daughter.  Verbalizes understanding.  Keep follow-up as scheduled at the end of the month. Urine ACR at next visit along with foot exam and hemoglobin A1c.      Return in about 3 weeks (around 08/10/2023) for Diabetes.Jolanda Nation, NP

## 2023-07-20 NOTE — Patient Instructions (Addendum)
 Start Meclizine 12.5 mg - can take it up to three times day for vertigo.   Start Augmentin antibiotics. Take 1 tablet by mouth twice daily for 7 days.  Start tirzepitide (Mounjaro) for diabetes/weight loss. Start by injecting 2.5 mg into the skin once weekly for 4 weeks.   Elevate your legs.  Start compression stockings.  Report weight gain to the heart doctor.   Follow up as scheduled.   It was a pleasure to see you today!

## 2023-07-20 NOTE — Assessment & Plan Note (Signed)
 Stable for outpatient treatment. Discussed safety precautions. Start meclizine 12.5 mg 3 times daily as needed for dizziness.  Schedule follow-up if symptoms do not improve or worsen.

## 2023-07-20 NOTE — Assessment & Plan Note (Signed)
 Symptoms suggestive of acute otitis media Start Augmentin twice daily x 7 days.  Rx sent.  Discussed with patient and daughter and advised to schedule follow-up with the ENT if not better.

## 2023-07-27 ENCOUNTER — Telehealth: Payer: Self-pay

## 2023-07-30 ENCOUNTER — Other Ambulatory Visit: Payer: Self-pay

## 2023-07-30 NOTE — Patient Outreach (Signed)
 Complex Care Management   Visit Note  07/30/2023  Name:  Jesse Torres MRN: [id number] DOB: 05/11/1950  Situation: Referral received for Complex Care Management related to Heart Failure and Diabetes with Complications I obtained verbal consent from Patient.  Visit completed with Patient  on the phone  Background:   Past Medical History:  Diagnosis Date   Cardiomyopathy (HCC)    probable HTN cardiomyopathy;  Lexiscan  Myoview (02/07/2013): EF 34%, old small apical infarct, no ischemia, moderate apical hypokinesis.   Chronic systolic CHF (congestive heart failure) (HCC)    a. Echocardiogram (02/05/2013): Mild LVH, EF 25-30%, diffuse HK, mild MR, mild LAE, mild RVE, moderately reduced RVSF, mild RAE, PASP 64, trivial effusion.;  b.  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   Vitrectomy 02-18-2020   Vitreous membranes and strands, left 02/19/2020   Contributing to pseudophakic CME in a scleral fixated posterior chamber lens  Vitrectomy 02-18-20, removal of cortical vitreous    Assessment: Patient Reported Symptoms:  Cognitive Cognitive Status: Alert and oriented to person, place, and time, Insightful and able to interpret abstract concepts, Normal speech and language skills      Neurological Neurological Review of Symptoms: No symptoms reported    HEENT HEENT Symptoms Reported: No symptoms reported      Cardiovascular Cardiovascular Symptoms Reported: No symptoms reported (Compression hose - leg swelling resolved) Cardiovascular Conditions: Hypertension, High blood cholesterol, Heart failure Cardiovascular Management Strategies: Adequate  rest, Diet modification, Exercise, Medication therapy, Routine screening, Weight management Weight: 194 lb (88 kg)  Respiratory Other Respiratory Symptoms: occasional SOB, no change since hospitalized Additional Respiratory Details: Patient hospitalized 07/10/23 for sepsis due to CAP, will continue to check BP/HR/Weight daily and report low/high BP, encouraged to get thermometer and check temp daily and as needed. Reviewed signs and symptoms of possible infection, and when to call PCP vs seek immediate evaluation at ED    Endocrine Patient reports the following symptoms related to hypoglycemia or hyperglycemia : No symptoms reported Is patient diabetic?: Yes Is patient checking blood sugars at home?: Yes Endocrine Conditions: Diabetes Endocrine Management Strategies: Diet modification, Weight management, Routine screening, Medication therapy, Adequate rest, Exercise  Gastrointestinal Gastrointestinal Symptoms Reported: No symptoms reported   Nutrition Risk Screen (CP): No indicators present  Genitourinary Genitourinary Symptoms Reported: No symptoms reported    Integumentary Integumentary Symptoms Reported: No symptoms reported    Musculoskeletal Musculoskelatal Symptoms Reviewed: No symptoms reported   Falls in the past year?: No Number of falls in past year: 1 or less Was there an injury with Fall?: No Fall Risk Category Calculator: 0 Patient Fall Risk Level: Low Fall Risk Patient at Risk for Falls Due to: Impaired balance/gait (Occasional vertigo, has meclazine prn with improvement)  Psychosocial Psychosocial Symptoms Reported: No symptoms reported     Quality of Family Relationships: helpful, involved, supportive Do you feel physically threatened by others?: No      07/30/2023    1:35 PM  Depression screen PHQ 2/9  Decreased Interest 0  Down, Depressed, Hopeless 0  PHQ - 2  Score 0    Vitals:   07/30/23 1316  BP: 133/72  Pulse: (!) 54    Medications Reviewed Today      Reviewed by Gilberto Labella, RN (Registered Nurse) on 07/30/23 at 1324  Med List Status: <None>   Medication Order Taking? Sig Documenting Provider Last Dose Status Informant  acetaminophen  (TYLENOL ) 500 MG tablet [id number] Yes Take 1 tablet (500 mg total) by mouth every 6 (six) hours as needed. Ivery Marking, PA-C  Active Family Member, Pharmacy Records           Med Note [place], LISA   Tue May 08, 2023 11:26 AM) Patient reports only taking PRN, infrequently needed for headache, etc  amLODipine  (NORVASC ) 2.5 MG tablet [id number] Yes Take 1 tablet (2.5 mg total) by mouth daily. Brodie Cannon, PA-C  Active Family Member, Pharmacy Records  carvedilol  (COREG ) 12.5 MG tablet [id number] Yes Take 1 tablet (12.5 mg total) by mouth 2 (two) times daily with a meal. Brodie Cannon, PA-C  Active Family Member, Pharmacy Records  furosemide  (LASIX ) 20 MG tablet [id number] Yes Take 1 tablet (20 mg total) by mouth daily. Brodie Cannon, PA-C  Active Family Member, Pharmacy Records  glucose blood (ACCU-CHEK GUIDE TEST) test strip [id number] Yes Use as instructed to check blood sugar. Dx. E11.9 Jolanda Nation, NP  Active Family Member, Pharmacy Records  ketorolac  (ACULAR ) 0.5 % ophthalmic solution [id number] Yes Place 1 drop into the left eye daily. [provider]  Active Family Member, Pharmacy Records  lisinopril  (ZESTRIL ) 40 MG tablet [id number] Yes Take 1 tablet (40 mg total) by mouth daily. Brodie Cannon, PA-C  Active Family Member, Pharmacy Records  meclizine  (ANTIVERT ) 12.5 MG tablet [id number] Yes Take 1 tablet (12.5 mg total) by mouth 3 (three) times daily as needed for dizziness. Jolanda Nation, NP  Active   metFORMIN  (GLUCOPHAGE ) 500 MG tablet [id number] Yes Take 2 tablets (1,000 mg total) by mouth 2 (two) times daily with a meal.  Patient taking differently: Take 500 mg by mouth 2 (two) times daily with a meal. Patient states changed after hospitalization to 1 tablet BID, will discuss with PCP at  visit next week   Jolanda Nation, NP  Active Family Member, Pharmacy Records  simvastatin  (ZOCOR ) 5 MG tablet [id number] Yes Take 1 tablet (5 mg total) by mouth daily. Brodie Cannon, PA-C  Active Family Member, Pharmacy Records  spironolactone  (ALDACTONE ) 25 MG tablet [id number] Yes Take 1 tablet (25 mg total) by mouth daily. Brodie Cannon, PA-C  Active Family Member, Pharmacy Records  tirzepatide  (MOUNJARO ) 2.5 MG/0.5ML Pen [id number] Yes Inject 2.5 mg into the skin once a week. Jolanda Nation, NP  Active             Recommendation:   PCP Follow-up Continue Current Plan of Care  Follow Up Plan:   Telephone follow-up in 1 month  Gilberto Labella, MSN, RN [facility]  [facility], [facility] RN Care Manager Direct Dial: [phone] Fax: [phone]

## 2023-07-30 NOTE — Patient Instructions (Signed)
 Visit Information  Thank you for taking time to visit with me today. Please don't hesitate to contact me if I can be of assistance to you before our next scheduled appointment.  Your next care management appointment is by telephone on 08/21/2023 at 1:00 pm  Telephone follow-up in 1 month  Please call the care guide team at [phone] if you need to cancel, schedule, or reschedule an appointment.   Please call the Suicide and Crisis Lifeline: 988 call the [place]  National Suicide Prevention Lifeline: 480-633-2982 or TTY: (805)149-7645 TTY 475-530-8197) to talk to a trained counselor call 1-800-273-TALK (toll free, 24 hour hotline) go to [facility] [address], [place] [phone]) call 911 if you are experiencing a Mental Health or Behavioral Health Crisis or need someone to talk to.  Gilberto Labella, MSN, RN [place]  [facility], [facility] RN Care Manager Direct Dial: [phone] Fax: [phone]   Sepsis, Diagnosis, Adult Sepsis is a serious bodily reaction to an infection. The infection that triggers sepsis may be from a bacteria, virus, or fungus. Sepsis can result from an infection in any part of your body. Infections that commonly lead to sepsis include skin, lung, and urinary tract infections. Sepsis is a medical emergency that must be treated right away in a hospital. In severe cases, it can lead to septic shock. Septic shock can weaken your heart and cause your blood pressure to drop. This can cause your central nervous system and your body's organs to stop working. What are the causes? This condition is caused by a severe reaction to infections from bacteria, viruses, or fungus. The germs that most often lead to sepsis include: Escherichia coli (E. coli) bacteria. Staphylococcus aureus (staph) bacteria. Some types of Streptococcus bacteria. The most common infections affect these organs: The lung  (pneumonia). The kidneys or bladder (urinary tract infection). The skin (cellulitis). The bowel, gallbladder, or pancreas. What increases the risk? You are more likely to develop this condition if: Your body's disease-fighting system (immune system) is weakened. You are age 26 or older. You are male. You had surgery or you have been hospitalized. You have these devices inserted into your body: A small, thin tube (catheter). IV line. Breathing tube. Drainage tube. You are not getting enough nutrients from food (malnourished). You have a chronic disease, such as cancer, lung disease, kidney disease, or diabetes. What are the signs or symptoms? Symptoms of this condition may include: Fever. Chills or feeling very cold. Confusion or anxiety. Fatigue. Muscle aches. Shortness of breath or rapid breathing (hyperventilation). Nausea and vomiting. Urinating much less than usual. Fast heart rate. Changes in skin color. Your skin may look blotchy, pale, or blue. Cool, clammy, or sweaty skin. Skin rash. Other symptoms depend on the source of your infection. How is this diagnosed? This condition is diagnosed based on your symptoms, medical history, and physical exam. Other tests may also be done to find out the cause of the infection and how severe the sepsis is. Tests may include: Blood tests. Urine tests. Swabs from other areas of your body that may have an infection. These samples may be tested (cultured) to find out what type of bacteria is causing the infection. Chest X-ray to check for pneumonia. Other imaging tests, such as a CT scan, may also be done. Lumbar puncture. This removes a small amount of the fluid that surrounds your brain and spinal cord. The fluid is then examined for infection. How is this treated? This  condition must be treated in a hospital. Based on the cause of your infection, you may be given an antibiotic, antiviral, or antifungal medicine. You may also  receive: Fluids through an IV. Oxygen and breathing assistance. Medicines to increase your blood pressure. Kidney dialysis. This process cleans your blood if your kidneys have failed. Surgery to remove infected tissue. Blood transfusion if needed. Medicine to prevent blood clots. Nutrients to correct imbalances in basic body function (metabolism). You may: Receive important salts and minerals (electrolytes) through an IV. Have your blood sugar level adjusted. Follow these instructions at home: Medicines  Take over-the-counter and prescription medicines only as told by your health care provider. If you were prescribed an antibiotic, antiviral, or antifungal medicine, take it as told by your health care provider. Do not stop taking the medicine even if you start to feel better. General instructions If you have a catheter or other indwelling device, ask to have it removed as soon as possible. Keep all follow-up visits. This is important. Contact a health care provider if: You do not feel like you are getting better or regaining strength. You are having trouble coping with your recovery. You frequently feel tired. You feel worse or do not seem to get better after surgery. You think you may have an infection after surgery. Get help right away if: You have any symptoms of sepsis. You have difficulty breathing. You have a rapid or skipping heartbeat. You become confused or disoriented. You have a high fever. Your skin becomes blotchy, pale, or blue. You have an infection that is getting worse or not getting better. These symptoms may represent a serious problem that is an emergency. Do not wait to see if the symptoms will go away. Get medical help right away. Call your local emergency services (911 in the [place] Do not drive yourself to the hospital. Summary Sepsis is a medical emergency that requires immediate treatment in a hospital. This condition is caused by a severe reaction to  infections from bacteria, viruses, or fungus. Based on the cause of your infection, you may be given an antibiotic, antiviral, or antifungal medicine. Treatment may also include IV fluids, breathing assistance, and kidney dialysis. This information is not intended to replace advice given to you by your health care provider. Make sure you discuss any questions you have with your health care provider. Document Revised: 01/02/2022 Document Reviewed: 01/02/2022 Elsevier Patient Education  2024 Elsevier Inc.  Preventing Infection If you have an infection, may have been exposed to an infection, or are taking care of someone who has an infection, it is important to know how to keep the infection from spreading. Follow your health care provider's instructions and use these guidelines to help stop the spread of infection. How infections are spread In order for an infection to spread, the following must be present: A germ. This may be a virus, bacteria, fungus, or parasite. A place for the germ to live. This may be: On or in a person, animal, plant, or food. In soil or water. On surfaces, such as a door handle. A person or animal who can develop a disease if the germ enters the body (host). The host does not have resistance to the germ. A way for the germ to enter the host. This may occur by: Direct contact with an infected person or animal. This can happen through shaking hands or hugging. Some germs can also travel through the air and spread to others. This can happen when an  infected person coughs or sneezes on or near other people. Indirect contact. This occurs when the germ enters the host through contact with an infected object. Examples include: Eating or drinking food or water that is contaminated with the germ. Touching a contaminated surface with your hands, and then touching your face, eyes, nose, or mouth. Supplies needed: Soap. Alcohol-based hand sanitizer. Standard cleaning  products. Disinfectants, such as bleach. Reusable cleaning cloths, sponges, or paper towels. Disposable or reusable utility gloves. How to prevent infection from spreading There are several things that you can do to help prevent infection from spreading. Take these general actions Everyone should take the following actions to prevent the spread of infection: Wash your hands often with soap and water for at least 20 seconds. If soap and water are not available, use alcohol-based hand sanitizer. Avoid touching your face, mouth, nose, or eyes. Cough or sneeze into a tissue, sleeve, or elbow instead of into your hand or into the air. If you cough or sneeze into a tissue, throw it away immediately and wash your hands.  Keep your bathroom clean Provide soap. Change towels and washcloths frequently. Change toothbrushes often and store them separately in a clean, dry place. Clean and disinfect all surfaces, including the toilet, floor, tub, shower, and sink. Do not share personal items, such as razors, toothbrushes, deodorant, combs, brushes, towels, and washcloths. Maintain hygiene in the [facility] your hands before and after preparing food and before you eat. Clean the inside of your refrigerator each week. Keep your refrigerator set at 2F (4C) or less, and set your freezer at 62F (-18C) or less. Keep work surfaces clean. Disinfect them regularly. Wash your dishes in hot, soapy water. Air-dry your dishes or use a dishwasher. Do not share dishes or eating utensils. Handle food safely Store food carefully. Refrigerate leftovers promptly in covered containers. Throw out stale or spoiled food. Thaw foods in the refrigerator or microwave, not at room temperature. Serve foods at the proper temperature. Do not eat raw meat. Make sure it is cooked to the appropriate temperature. Cook eggs until they are firm. Wash fruits and vegetables under running water. Use separate cutting boards,  plates, and utensils for raw foods and cooked foods. Use a clean spoon each time you sample food while cooking. Do laundry the right way Wear gloves if laundry is visibly soiled. Do not shake soiled laundry. Doing that may send germs into the air. Wash laundry in hot water. If you cannot wash the laundry right away, place it in a plastic bag and wash it as soon as possible. Be careful around animals and pets Wash your hands before and after touching animals. If you have a pet, ensure that your pet stays clean. Do not let people with weak immune systems touch bird droppings, fish tank water, or a litter box. If you have a pet cage or litter box, be sure to clean it every day. If you are sick, stay away from animals and have someone else care for them if possible. How to clean and disinfect objects and surfaces Precautions Some disinfectants work for certain germs and not others. Read the manufacturer's instructions or read online resources to determine if the product you are using will work for the germ you are trying to remove. If you choose to use bleach, use it safely. Never mix it with other cleaning products, especially those that contain ammonia. This mixture can create a dangerous gas that may be deadly. Keep proper  movement of fresh air in your home (ventilation). Pour used mop water down the utility sink or toilet. Do not pour this water down the kitchen sink. Objects and surfaces  If surfaces are visibly soiled, clean them first with soap and water before disinfecting. Disinfect surfaces that are frequently touched every day. This may include: Counters. Tables. Doorknobs. Sinks and faucets. Electronics, such as: Engineer, technical sales. Remote controls. Keyboards. Computers and tablets. Cleaning supplies Some cleaning supplies can breed germs. Take good care of them to prevent germs from spreading. To do this: Soak toilet brushes, mops, and sponges in bleach and water for 5 minutes after  each use, or according to manufacturer's instructions. Wash reusable cleaning cloths and sanitize sponges after each use. Throw away disposable gloves after one use. Replace reusable utility gloves if they are cracked or torn or if they start to peel. Additional actions if you are sick If you live with other people:  Avoid close contact with those around you. Stay at least 3 ft (1 m) away from others, if possible. Use a separate bathroom, if possible. If possible, sleep in a separate bedroom or in a separate bed to prevent infecting other household members. Change bedroom linens each week or whenever they are soiled. Have everyone in the household wash hands often with soap and water for at least 20 seconds. If soap and water are not available, use alcohol-based hand sanitizer. In general: Stay home except to get medical care. Call ahead before visiting your health care provider. Ask others to get groceries and household supplies and to refill prescriptions for you. Avoid public areas. Try not to take public transportation. If you can, wear a mask if you need to go out of the house, or if you are in close contact with someone who is not sick. Avoid visitors until you have completely recovered, or until you have no signs and symptoms of infection. Avoid preparing food or providing care for others. If you must prepare food or provide care for others, wear a mask and wash your hands before and after doing these things. Where to find more information Centers for Disease Control and Prevention: [employer]erProm[employer].no Summary It is important to know how to keep infection from spreading. Make sure everyone in your household washes their hands often with soap and water. Disinfect surfaces that are frequently touched every day. If you are sick, stay home except to get medical care. This information is not intended to replace advice given to you by your health care provider. Make sure you discuss any questions you  have with your health care provider. Document Revised: 03/21/2021 Document Reviewed: 03/21/2021 Elsevier Patient Education  2024 [employer].

## 2023-08-07 ENCOUNTER — Ambulatory Visit: Payer: Self-pay | Admitting: General Practice

## 2023-08-07 ENCOUNTER — Telehealth: Payer: Self-pay

## 2023-08-07 ENCOUNTER — Encounter: Payer: Self-pay | Admitting: General Practice

## 2023-08-07 ENCOUNTER — Ambulatory Visit (INDEPENDENT_AMBULATORY_CARE_PROVIDER_SITE_OTHER): Admitting: General Practice

## 2023-08-07 VITALS — BP 118/60 | HR 72 | Temp 98.4°F | Ht 67.0 in | Wt 195.0 lb

## 2023-08-07 DIAGNOSIS — E119 Type 2 diabetes mellitus without complications: Secondary | ICD-10-CM | POA: Diagnosis not present

## 2023-08-07 DIAGNOSIS — E1165 Type 2 diabetes mellitus with hyperglycemia: Secondary | ICD-10-CM | POA: Diagnosis not present

## 2023-08-07 DIAGNOSIS — Z7984 Long term (current) use of oral hypoglycemic drugs: Secondary | ICD-10-CM | POA: Diagnosis not present

## 2023-08-07 DIAGNOSIS — Z7985 Long-term (current) use of injectable non-insulin antidiabetic drugs: Secondary | ICD-10-CM | POA: Diagnosis not present

## 2023-08-07 DIAGNOSIS — B351 Tinea unguium: Secondary | ICD-10-CM | POA: Insufficient documentation

## 2023-08-07 DIAGNOSIS — Z1211 Encounter for screening for malignant neoplasm of colon: Secondary | ICD-10-CM | POA: Diagnosis not present

## 2023-08-07 LAB — CBC
HCT: 36.6 % — ABNORMAL LOW (ref 39.0–52.0)
Hemoglobin: 11.9 g/dL — ABNORMAL LOW (ref 13.0–17.0)
MCHC: 32.6 g/dL (ref 30.0–36.0)
MCV: 81.2 fl (ref 78.0–100.0)
Platelets: 142 10*3/uL — ABNORMAL LOW (ref 150.0–400.0)
RBC: 4.51 Mil/uL (ref 4.22–5.81)
RDW: 16.1 % — ABNORMAL HIGH (ref 11.5–15.5)
WBC: 4.6 10*3/uL (ref 4.0–10.5)

## 2023-08-07 LAB — BASIC METABOLIC PANEL WITH GFR
BUN: 18 mg/dL (ref 6–23)
CO2: 32 meq/L (ref 19–32)
Calcium: 9.6 mg/dL (ref 8.4–10.5)
Chloride: 99 meq/L (ref 96–112)
Creatinine, Ser: 1.55 mg/dL — ABNORMAL HIGH (ref 0.40–1.50)
GFR: 44.35 mL/min — ABNORMAL LOW (ref >60.00)
Glucose, Bld: 128 mg/dL — ABNORMAL HIGH (ref 70–99)
Potassium: 4.3 meq/L (ref 3.5–5.1)
Sodium: 137 meq/L (ref 135–145)

## 2023-08-07 LAB — MICROALBUMIN / CREATININE URINE RATIO
Creatinine,U: 21.4 mg/dL
Microalb Creat Ratio: 49.6 mg/g — ABNORMAL HIGH (ref 0.0–30.0)
Microalb, Ur: 1.1 mg/dL (ref 0.0–1.9)

## 2023-08-07 LAB — HEMOGLOBIN A1C: Hgb A1c MFr Bld: 6.8 % — ABNORMAL HIGH (ref 4.6–6.5)

## 2023-08-07 MED ORDER — OZEMPIC (0.25 OR 0.5 MG/DOSE) 2 MG/1.5ML ~~LOC~~ SOPN
PEN_INJECTOR | SUBCUTANEOUS | 0 refills | Status: AC
Start: 2023-08-07 — End: 2023-10-06

## 2023-08-07 NOTE — Telephone Encounter (Signed)
 Copied from CRM [id number]. Topic: Clinical - Medication Question >> Aug 07, 2023  2:52 PM [place] wrote: Reason for CRM: Patient calling back to let Provider know what semaglutide (Ozempic) the co-pay is $250, as well as the other medications in that family as well, He did NOT pick up the medication

## 2023-08-07 NOTE — Patient Instructions (Addendum)
 Stop by the lab prior to leaving today. I will notify you of your results once received.   You will either be contacted via phone regarding your referral to podiatry, or you may receive a letter on your MyChart portal from our referral team with instructions for scheduling an appointment. Please let us  know if you have not been contacted by anyone within two weeks.  Start semaglutide (Ozempic) for diabetes. Start by injecting 0.25 mg into the skin once weekly for 4 weeks, then increase to 0.5 mg once weekly thereafter. F/u in 2 months.   Schedule diabetic eye exam.  Please complete the cologuard.   Start Metformin  500 mg 2 tablet in the morning and 2 tablet in the evening.   Please let me know as soon as possible if the medication cost is high.   Follow up in 2 months.   It was a pleasure to see you today!

## 2023-08-07 NOTE — Addendum Note (Signed)
 Addended by: VINCENTE SHIVERS on: 08/07/2023 04:01 PM   Modules accepted: Orders

## 2023-08-07 NOTE — Assessment & Plan Note (Signed)
 Uncontrolled. All of patient toenails suggestive of fungal infection.  Discussed the importance of type 2 diabetes and footcare.  Handout provided.  Referral placed for podiatry.

## 2023-08-07 NOTE — Assessment & Plan Note (Signed)
 Uncontrolled.  Discussed medication adherence at length with patient and his daughter.  Discussed the risk of uncontrolled diabetes.  Patient advised to take metformin  1000 mg twice daily with meals. Rx sent for Ozempic 0.25 mg once weekly for 4 weeks and then increase to 0.5 mg once weekly thereafter.  He will update later today if the medication cost is too high. Foot exam completed today. Urine ACR pending. Patient and patient's daughter advised to schedule eye exam.  Follow-up in 2 months.

## 2023-08-07 NOTE — Telephone Encounter (Signed)
 Spoke with patient and advised about referral. Patient verbalized understanding and thanks us  for getting this done.

## 2023-08-07 NOTE — Progress Notes (Signed)
 Established Patient Office Visit  Subjective   Patient ID: Jesse Torres, male    DOB: [birth year]  Age: 73 y.o. MRN: [id number]  Chief Complaint  Patient presents with   Diabetes    Patient here today to follow up on DM. Patient taking metformin  2 tabs BID; could not get the mounjaro  due to cost. Patient due for dm eye exam but has to get rescheduled after being in the hospital.     Diabetes Pertinent negatives for hypoglycemia include no dizziness, headaches or nervousness/anxiousness. Pertinent negatives for diabetes include no chest pain and no polydipsia.   Pope Brunty is a 73 year old male with past medical history of hypertension, right vertebral particular dysfunction, pulmonary hypertension, heart failure, CAD, OSA, type 2 diabetes, obesity, dyslipidemia presents today for hospital follow-up.   His daughter, Rexene, is also present today.   Type 2 DM: He was evaluated 07/20/23 and was started on Mounjaro  2.5 mg weekly and Metformin  1000 mg. He did not start the mounjaro  as it was $250 for one month supply. He and his daughter report that he has been taking Metformin  500 mg twice daily. He reports his fasting readings range between 140s-160s. He denies any hypoglycemic events. He has been trying to monitor his diet. He does not do any regular exercise. He is due for his foot exam, urine ACR and diabetic eye exam.   Patient Active Problem List   Diagnosis Date Noted   Onychomycosis 08/07/2023   Hospital discharge follow-up 07/20/2023   Acute otitis media 07/20/2023   Vertigo 07/20/2023   Severe sepsis with acute organ dysfunction (HCC) 07/10/2023   AKI (acute kidney injury) (HCC) 07/10/2023   Hypothermia 07/10/2023   CAP (community acquired pneumonia) 07/10/2023   Establishing care with new doctor, encounter for 06/18/2023   Screening for colon cancer 06/18/2023   Uncontrolled type 2 diabetes mellitus with hyperglycemia (HCC) 09/26/2021   Pure hypercholesterolemia 09/25/2021    Cystoid macular edema of left eye 12/08/2019   Postherpetic neuralgia 02/04/2015   PVC's (premature ventricular contractions) 02/16/2014   CAD (coronary artery disease) 05/05/2013   Dyslipidemia 03/11/2013   Cardiomyopathy (HCC)    HFimpEF (heart failure with improved EF    Thrombocytopenia (HCC)    Snoring    Right ventricular dysfunction 02/06/2013   Pulmonary hypertension (HCC) 02/06/2013   RBBB (right bundle branch block) 02/06/2013   Essential hypertension 02/05/2013   Sleep apnea, obstructive 02/05/2013   Obesity 02/05/2013   Past Medical History:  Diagnosis Date   Cardiomyopathy (HCC)    probable HTN cardiomyopathy;  Lexiscan  Myoview (02/07/2013): EF 34%, old small apical infarct, no ischemia, moderate apical hypokinesis.   Chronic systolic CHF (congestive heart failure) (HCC)    a. Echocardiogram (02/05/2013): Mild LVH, EF 25-30%, diffuse HK, mild MR, mild LAE, mild RVE, moderately reduced RVSF, mild RAE, PASP 64, trivial effusion.;  b.  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   Vitrectomy 02-18-2020   Vitreous membranes and strands, left 02/19/2020   Contributing to pseudophakic CME in a scleral fixated posterior chamber  lens  Vitrectomy 02-18-20, removal of cortical vitreous   Past Surgical History:  Procedure Laterality Date   Cataract surgery     No Known Allergies       08/07/2023    9:38 AM 07/30/2023    1:35 PM 06/27/2023    2:04 PM  Depression screen PHQ 2/9  Decreased Interest 0 0 0  Down, Depressed, Hopeless 0 0 0  PHQ - 2 Score 0 0 0  Altered sleeping 0    Tired, decreased energy 0    Change in appetite  0    Feeling bad or failure about yourself  0    Trouble concentrating 0    Moving slowly or fidgety/restless 0    Suicidal thoughts 0    PHQ-9 Score 0    Difficult doing work/chores Not difficult at all         08/07/2023    9:38 AM 06/18/2023   12:24 PM 08/08/2018    3:53 PM 04/08/2015   11:39 AM  GAD 7 : Generalized Anxiety Score  Nervous, Anxious, on Edge 0 0 0 0  Control/stop worrying 0 0 0 0  Worry too much - different things 0 0 0 0  Trouble relaxing 0 0 0 0  Restless 0 0 0 0  Easily annoyed or irritable 0 0 0 0  Afraid - awful might happen 0 0 0 0  Total GAD 7 Score 0 0 0 0  Anxiety Difficulty Not difficult at all Not difficult at all Not difficult at all Not difficult at all      Review of Systems  Constitutional:  Negative for chills and fever.  Respiratory:  Negative for shortness of breath.   Cardiovascular:  Negative for chest pain.  Gastrointestinal:  Negative for abdominal pain, constipation, diarrhea, heartburn, nausea and vomiting.  Genitourinary:  Negative for dysuria, frequency and urgency.  Neurological:  Negative for dizziness and headaches.  Endo/Heme/Allergies:  Negative for polydipsia.  Psychiatric/Behavioral:  Negative for depression and suicidal ideas. The patient is not nervous/anxious.       Objective:     BP 118/60   Pulse 72   Temp 98.4 F (36.9 C) (Temporal)   Ht 5' 7 (1.702 m)   Wt 195 lb (88.5 kg)   SpO2 98%   BMI 30.54 kg/m  BP Readings from Last 3 Encounters:  08/07/23 118/60  07/30/23 133/72  07/20/23 118/70   Wt Readings from Last 3 Encounters:  08/07/23 195 lb (88.5 kg)  07/30/23 194 lb (88 kg)  07/20/23 204 lb (92.5 kg)      Physical Exam Vitals and nursing note reviewed.  Constitutional:      Appearance: Normal appearance.   Cardiovascular:     Rate and Rhythm: Normal rate and regular rhythm.     Pulses: Normal pulses.     Heart sounds: Normal heart sounds.  Pulmonary:     Effort: Pulmonary effort is normal.      Breath sounds: Normal breath sounds.   Neurological:     Mental Status: He is alert and oriented to person, place, and time.   Psychiatric:        Mood and Affect: Mood normal.        Behavior: Behavior normal.        Thought Content: Thought content normal.        Judgment: Judgment normal.      No results found for any visits on 08/07/23.     The ASCVD Risk  score (Arnett DK, et al., 2019) failed to calculate for the following reasons:   The valid total cholesterol range is 130 to 320 mg/dL    Assessment & Plan:  Type 2 diabetes mellitus without complication, without long-term current use of insulin  (HCC) -     Ozempic (0.25 or 0.5 MG/DOSE); Inject 0.25 mg into the skin once a week for 30 days, THEN 0.5 mg once a week.  Dispense: 2.25 mL; Refill: 0 -     Hemoglobin A1c -     Microalbumin / creatinine urine ratio -     Basic metabolic panel with GFR -     Ambulatory referral to Podiatry -     CBC  Onychomycosis Assessment & Plan: Uncontrolled. All of patient toenails suggestive of fungal infection.  Discussed the importance of type 2 diabetes and footcare.  Handout provided.  Referral placed for podiatry.  Orders: -     Ambulatory referral to Podiatry  Screening for colon cancer -     Cologuard  Uncontrolled type 2 diabetes mellitus with hyperglycemia (HCC) Assessment & Plan: Uncontrolled.  Discussed medication adherence at length with patient and his daughter.  Discussed the risk of uncontrolled diabetes.  Patient advised to take metformin  1000 mg twice daily with meals. Rx sent for Ozempic 0.25 mg once weekly for 4 weeks and then increase to 0.5 mg once weekly thereafter.  He will update later today if the medication cost is too high. Foot exam completed today. Urine ACR pending. Patient and patient's daughter advised to schedule eye exam.  Follow-up in 2 months.      Return in about 2 months (around 10/07/2023) for DM.    Carrol Aurora, NP

## 2023-08-08 ENCOUNTER — Telehealth: Payer: Self-pay

## 2023-08-08 NOTE — Progress Notes (Signed)
 Complex Care Management Note  Care Guide Note 08/08/2023 Name: [name] MRN: [id number] DOB: November 18, 1950  Jesse Torres is a 73 y.o. year old male who sees Vincente Shivers, NP for primary care. I reached out to Sherida JONELLE Player by phone today to offer complex care management services.  Mr. Christofferson was given information about Complex Care Management services today including:   The Complex Care Management services include support from the care team which includes your Nurse Care Manager, Clinical Social Worker, or Pharmacist.  The Complex Care Management team is here to help remove barriers to the health concerns and goals most important to you. Complex Care Management services are voluntary, and the patient may decline or stop services at any time by request to their care team member.   Complex Care Management Consent Status: Patient agreed to services and verbal consent obtained.   Follow up plan:  Telephone appointment with complex care management team member scheduled for:  08/10/23 at 9:00 a.m.   Encounter Outcome:  Patient Scheduled  Dreama [facility]  [facility], [facility] Health Care Management Assistant Direct Dial: [phone]  Fax: [phone]

## 2023-08-10 ENCOUNTER — Other Ambulatory Visit (INDEPENDENT_AMBULATORY_CARE_PROVIDER_SITE_OTHER): Admitting: Pharmacist

## 2023-08-10 DIAGNOSIS — E119 Type 2 diabetes mellitus without complications: Secondary | ICD-10-CM

## 2023-08-10 DIAGNOSIS — E1165 Type 2 diabetes mellitus with hyperglycemia: Secondary | ICD-10-CM

## 2023-08-10 NOTE — Progress Notes (Unsigned)
 08/14/2023 Name: Jesse Torres MRN: [id number] DOB: April 06, 1950  Subjective  Chief Complaint  Patient presents with   Diabetes    Reason for visit: ?  DEMARRIUS Torres is a 73 y.o. male with a history of diabetes (type 2), who presents today for an initial diabetes pharmacotherapy visit.? Pertinent PMH also includes HTN, PAH, HF(imp), CAD, OSA, HLD, obesity, macular edema, ertigo.  Known DM Complications: no known complications   Care Team: Primary Care Provider: Vincente Shivers, NP   Date of Last Diabetes Related Visit: with PCP on 08/07/23  Recent Summary of Change: 6/25: GLP1 cost-prohibitive, did not increase metformin , still taking 500 mg BID. FBG 140-160s.  6/6: Resume metformin  1000 mg BID. Start Mounjaro  2.5 mg weekly.   Medication Access/Adherence: Prescription drug coverage: Payor: HU[employer]NA MEDICARE / Plan: HUMANA MEDICARE HMO / Product Type: *No Product type* / .  - Reports that all medications are not affordable.  - Current Patient Assistance: None - Medication Adherence: Patient denies missing doses of their medication.    History of Present Illness: ?  Hospitalization w discharge 5/31 cc CAP c/b septic shock and AKI.  Diabetes w A1c 9.6% (05/02/23), managed on SSI while inpatient. Low oral intake. Improved A1c on recheck.   Since Last visit: Patient reports implementing plan from last visit. Denies adverse effects with titration of metformin  back to goal dose.   Reported DM Regimen: ?  Metformin  1000 mg twice daily (increased this week)    SMBG: via glucometer ? FBG have ranged 140-164 g/dL.  Hypo/Hyperglycemia: ?  Symptoms of hypoglycemia since last visit:? no  Symptoms of hyperglycemia since last visit:? no  Reported Diet: Patient typically eats 3 meals per day.  Breakfast: 2 eggs with cheese Lunch: Usually eats at home Dinner: Usually eats at home. Spam, hamburger. Green beans, pinto beans. Avoids rice. Beverages: A lot more water. Occasional Diet soda.  Coffee with small amount of cream.   Exercise: No formal exercise  DM Prevention:  Statin: Taking; low intensity History of chronic kidney disease? no History of albuminuria? yes, last UACR on 08/07/23 = 49.6 mg/g ACE/ARB - Taking lisinopril  40 mg daily; Urine MA/CR Ratio - elevated urinary albumin  excretion.  Last eye exam: 12/03/19; Retinopathy Present - DUE Last foot exam: 08/07/2023 Tobacco Use: Former smoker   Cardiovascular Risk Reduction History of clinical ASCVD? yes History of heart failure? yes (improved EF) History of hyperlipidemia? yes Current BMI: 30.5 kg/m2 (Ht 67 in, Wt 88.5 kg) Taking statin? yes; low intensity (simvastatin  5mg ) Taking aspirin ? indicated (secondary prevention); Not taking   Taking SGLT-2i? no Taking GLP- 1 RA? no   Checking BP at home intermittently. Reports range from 119/67 - 122/71 mmHg.      _______________________________________________  Objective    Review of Systems:? Limited in the setting of virtual visit GI:? No nausea, vomiting, constipation, diarrhea, abdominal pain, dyspepsia, change in bowel habits  Endocrine:? No polyuria, polyphagia or blurred vision    Physical Examination:  Vitals:  Wt Readings from Last 3 Encounters:  08/07/23 195 lb (88.5 kg)  07/30/23 194 lb (88 kg)  07/20/23 204 lb (92.5 kg)   BP Readings from Last 3 Encounters:  08/07/23 118/60  07/30/23 133/72  07/20/23 118/70   Pulse Readings from Last 3 Encounters:  08/07/23 72  07/30/23 (!) 54  07/20/23 (!) 55     Labs:?  Lab Results  Component Value Date   HGBA1C 6.8 (H) 08/07/2023   HGBA1C 9.6 (H) 05/02/2023  HGBA1C 8.4 (H) 08/14/2022   GLUCOSE 128 (H) 08/07/2023   MICRALBCREAT 49.6 (H) 08/07/2023   CREATININE 1.55 (H) 08/07/2023   CREATININE 1.16 07/14/2023   CREATININE 1.18 07/13/2023   GFR 44.35 (L) 08/07/2023   GFR 68.15 05/06/2014   GFR 72.52 03/16/2014    Lab Results  Component Value Date   CHOL 112 08/14/2022   LDLCALC 57  08/14/2022   LDLCALC 48 03/24/2021   LDLCALC 65 01/27/2020   HDL 32 (L) 08/14/2022   TRIG 125 08/14/2022   TRIG 104 03/24/2021   TRIG 101 01/27/2020   ALT 21 07/10/2023   ALT 9 08/14/2022   AST 31 07/10/2023   AST 11 08/14/2022      Chemistry      Component Value Date/Time   NA 137 08/07/2023 1017   NA 135 05/02/2023 1401   K 4.3 08/07/2023 1017   CL 99 08/07/2023 1017   CO2 32 08/07/2023 1017   BUN 18 08/07/2023 1017   BUN 18 05/02/2023 1401   CREATININE 1.55 (H) 08/07/2023 1017   CREATININE 1.26 (H) 01/26/2016 0820      Component Value Date/Time   CALCIUM 9.6 08/07/2023 1017   ALKPHOS 48 07/10/2023 1109   AST 31 07/10/2023 1109   ALT 21 07/10/2023 1109   BILITOT 1.2 07/10/2023 1109   BILITOT 1.0 08/14/2022 0958     The ASCVD Risk score (Arnett DK, et al., 2019) failed to calculate for the following reasons:   The valid total cholesterol range is 130 to 320 mg/dL  Assessment and Plan:   1. Diabetes, type 2: uncontrolled per home BG monitoring with FBG consistently >130 mg/dL. Goal A1c <7% without hypoglycemia. Last A1c of 6.8% (08/07/23), decreased from previous 9.6%. Seemingly well-controlled, though less clear in the setting of recent hospitalization with sepsis. Managed on SSI inpatient, low oral intake, also general factors w sepsis such as possible change in RBC turnover, significant increase in inflammatory mediators, etc. may have impact upon A1c. Suspect worsened glycemic control since hospital discharge, supported by SMBG log today. Continue to track SMBG.  Current Regimen: metformin  1000 mg BID (increased 6/24)   Medication Considerations: Metformin :  Metformin  titration limited at this time w recent AKI. Repeat BMP prior to titration. Resume 1000 mg BID once GFR stable at baseline >45 GLP1-RA: cost prohibitive through insurance. May qualify for PAP. Would benefit from A1c-lowering propensity, CAD risk-reduction, OSA, renal protective, weight loss propensity. No  documented hx FLD. LFTs WNL.  SGLT2i: UACR elevated (possibly 2/2 chronic hyperglycemia, though suspect component of acute elevation in the setting of AKI). May benefit from SGLT2i regardless given slowing of CKD progression. Consider once eGFR stabilizes.        Medication Therapy Plan 6/27: Reduce Metformin  to 500 mg tablet twice daily. Per recent lab result, kidney function not yet recovered >45 Novo PAP application initiated: Ozempic   Repeat renal function ordered ~ 2 weeks given GFR remains well below previous baseline s/p AKI    2. HTN: controlled based on last clinic BP of 118/60 mmHg (08/07/23), goal <130/80 mmHg. Does not monitor BP at home. Denies lightheadedness, dizziness, SOB, CP, vision changes.  Current Regimen: amlodipine  2.5 mg daily, carvedilol  12.5 mg BID, furosemide  20 mg daily, spironolactone  25 mg daily, lisinopril  40 mg daily Continue medications without changes.    3. ASCVD (secondary prevention): LDL fairly controlled on last lipid panel with LDL 57 mg/dL, TG 874 mg/dL (03/23/73). LDL goal <55 mg/dL (secondary prevention). Due for repeat lipid  panel.  Key risk factors include: diabetes, hypertension (well-controlled), hyperlipidemia (well-controlled), former smoker, BMI >30 kg/m2, and sedentary lifestyle Current Regimen: simvastatin  5 mg daily Hx: Started on simvastatin  5mg  in 2015, never titrated or escalated.  Continue medications today without changes.    Follow Up Follow up with clinical pharmacist via phone in ~2 weeks once repeat labs result.  Patient given direct line for questions regarding medication therapy and MAP applications  Future Appointments  Date Time Provider Department Center  08/24/2023  9:00 AM [facility] LAB [facility]  08/27/2023 10:00 AM [facility] PHARMACIST [facility]  08/28/2023  1:00 PM Devra Lands, RN [facility] None  09/06/2023  8:10 AM [facility] ANNUAL WELLNESS VISIT 2 [facility]  10/08/2023  9:00 AM Jesse Shivers, NP [facility]   11/08/2023  8:00 AM Darron Deatrice LABOR, MD [facility] None    Manuelita FABIENE Kobs, PharmD Clinical Pharmacist [facility] [phone]

## 2023-08-13 ENCOUNTER — Encounter: Payer: Self-pay | Admitting: Pharmacist

## 2023-08-13 NOTE — Progress Notes (Signed)
 Patient Assistance Program (PAP) Application   Manufacturer: Novo Nordisk    (New enrollment) Medication(s): Ozempic   Patient Portion of Application:  6/30: Completed with patient via online enrollment tool. Submitted. Uploaded to Media Tab.  Income Documentation: N/A - Electronic verification elected.  Provider Portion of Application:  08/13/23: uploaded to PCP eFax folder for review/signature Prescriptions (Ozempic , 1 box Ea: 0.25, 0.5, 1.0, 2.0)    Next Steps: [x]    Application filled out and uploaded to PCP eFax folder for review/signature []    Upon PCP signature: Fax to our pharmacy team PAP Team: CPhT Patient Advocate Team Fax: [phone]  Forwarded to [facility] CPhT Patient Advocate Team for future correspondences/re-enrollment.  Note routed to PCP Clinic Pool to ensure PCP signature is obtained and application is faxed.

## 2023-08-13 NOTE — Progress Notes (Signed)
 Forms printed and given to [facility] to sign; forms have been faxed back.

## 2023-08-14 MED ORDER — METFORMIN HCL 500 MG PO TABS: 500.0000 mg | ORAL_TABLET | Freq: Two times a day (BID) | ORAL | Status: AC

## 2023-08-21 ENCOUNTER — Telehealth: Payer: Self-pay

## 2023-08-22 ENCOUNTER — Telehealth

## 2023-08-24 ENCOUNTER — Other Ambulatory Visit (INDEPENDENT_AMBULATORY_CARE_PROVIDER_SITE_OTHER)

## 2023-08-24 ENCOUNTER — Telehealth: Payer: Self-pay | Admitting: General Practice

## 2023-08-24 ENCOUNTER — Ambulatory Visit: Payer: Self-pay | Admitting: Primary Care

## 2023-08-24 DIAGNOSIS — E119 Type 2 diabetes mellitus without complications: Secondary | ICD-10-CM | POA: Diagnosis not present

## 2023-08-24 LAB — BASIC METABOLIC PANEL WITH GFR
BUN: 25 mg/dL — ABNORMAL HIGH (ref 6–23)
CO2: 28 meq/L (ref 19–32)
Calcium: 10 mg/dL (ref 8.4–10.5)
Chloride: 103 meq/L (ref 96–112)
Creatinine, Ser: 1.53 mg/dL — ABNORMAL HIGH (ref 0.40–1.50)
GFR: 45.03 mL/min — ABNORMAL LOW (ref >60.00)
Glucose, Bld: 153 mg/dL — ABNORMAL HIGH (ref 70–99)
Potassium: 4 meq/L (ref 3.5–5.1)
Sodium: 138 meq/L (ref 135–145)

## 2023-08-24 NOTE — Telephone Encounter (Signed)
 Prescription Request  08/24/2023  LOV: 08/07/2023  What is the name of the medication or equipment? Test strips   Have you contacted your pharmacy to request a refill? No   Which pharmacy would you like this sent to?  [facility]    Patient notified that their request is being sent to the clinical staff for review and that they should receive a response within 2 business days.   Please advise at Mobile [phone] (mobile)

## 2023-08-24 NOTE — Telephone Encounter (Signed)
 Strips were sent to [facility] pharmacy on 06/18/23 #100 with 12 refills. Patient should have refills on file; called and left patient a message to call back to discuss.

## 2023-08-27 ENCOUNTER — Other Ambulatory Visit (INDEPENDENT_AMBULATORY_CARE_PROVIDER_SITE_OTHER): Admitting: Pharmacist

## 2023-08-27 DIAGNOSIS — E1165 Type 2 diabetes mellitus with hyperglycemia: Secondary | ICD-10-CM

## 2023-08-27 NOTE — Telephone Encounter (Signed)
 Spoke with patient and advised patient to call [facility] about his refills as they should have refills on file for him and will send them to him. Patient verbalized understanding.

## 2023-08-27 NOTE — Progress Notes (Unsigned)
 08/27/2023 Name: Jesse Torres MRN: [id number] DOB: 30-Jan-1951  Subjective  Chief Complaint  Patient presents with   Diabetes    Reason for visit: ?  Jesse Torres is a 73 y.o. male with a history of diabetes (type 2), who presents today for a follow up diabetes pharmacotherapy visit.? Pertinent PMH also includes HTN, PAH, HF(imp), CAD, OSA, HLD, obesity, macular edema, ertigo.  Known DM Complications: no known complications   Care Team: Primary Care Provider: Vincente Shivers, NP   Date of Last Diabetes Related Visit: with PCP on 08/07/23, with PharmD 08/10/23 Recent Summary of Change: 6/27: ?? metformin  500 mg BID per eGFR <45. Repeat BMP 2 weeks. Ozempic  PAP submitted 6/25: GLP1 cost-prohibitive, did not increase metformin , still taking 500 mg BID. FBG 140-160s.  6/6: Resume metformin  1000 mg BID. Start Mounjaro  2.5 mg weekly.   Medication Access/Adherence: Prescription drug coverage: Payor: HUMANA MEDICARE / Plan: HUMANA MEDICARE HMO / Product Type: *No Product type* / .  - Current Patient Assistance: Novo (Ozempic ) - Medication Adherence: Patient denies missing doses of their medication.  History of Present Illness: ?  Hospitalization w discharge 5/31 cc CAP c/b septic shock and AKI.  Diabetes w A1c 9.6% (05/02/23). Managed on SSI while inpatient. Low oral intake. Improved A1c on recheck though w SMBG suggesting uncontrolled DM.   Since Last visit: Patient reports implementing plan from last visit. Using metformin  at half dose, 500 mg twice daily. Since last visit, has been approved for Novo PAP though still awaiting shipment of Ozempic  at this time. Delivery w/i 10-14 business days should be by end of next week.   Reported DM Regimen: ?  Metformin  500 mg twice daily    SMBG: via glucometer ? FBG have ranged 140s-160s g/dL most days. Occasionally higher if carb-heavy evening meal (pasta) Afternoons as low as 110s, sometimes higher.   Hypo/Hyperglycemia: ?  Symptoms of  hypoglycemia since last visit:? no  Symptoms of hyperglycemia since last visit:? no  Reported Diet: Patient typically eats 3 meals per day.  Breakfast: 2 eggs with cheese Lunch: Usually eats at home Dinner: Usually eats at home. Spam, hamburger. Green beans, pinto beans. Avoids rice. Beverages: A lot more water. Occasional Diet soda. Coffee with small amount of cream.   Exercise: No formal exercise  DM Prevention:  Statin: Taking; low intensity History of chronic kidney disease? no History of albuminuria? yes, last UACR on 08/07/23 = 49.6 mg/g ACE/ARB - Taking lisinopril  40 mg daily; Urine MA/CR Ratio - elevated urinary albumin  excretion.  Last eye exam: 12/03/19; Retinopathy Present - DUE Last foot exam: 08/07/2023 Tobacco Use: Former smoker   Cardiovascular Risk Reduction History of clinical ASCVD? yes History of heart failure? yes (improved EF) History of hyperlipidemia? yes Current BMI: 30.5 kg/m2 (Ht 67 in, Wt 88.5 kg) Taking statin? yes; low intensity (simvastatin  5mg ) Taking aspirin ? indicated (secondary prevention); Not taking   Taking SGLT-2i? no Taking GLP- 1 RA? no   Checking BP at home intermittently. Reports consistent readings ranging ~100s-120s/60s-70s.       _______________________________________________  Objective    Review of Systems:? Limited in the setting of virtual visit GI:? No nausea, vomiting, constipation, diarrhea, abdominal pain, dyspepsia, change in bowel habits  Endocrine:? No polyuria, polyphagia or blurred vision    Physical Examination:  Vitals:  Wt Readings from Last 3 Encounters:  08/07/23 195 lb (88.5 kg)  07/30/23 194 lb (88 kg)  07/20/23 204 lb (92.5 kg)   BP Readings from Last  3 Encounters:  08/07/23 118/60  07/30/23 133/72  07/20/23 118/70   Pulse Readings from Last 3 Encounters:  08/07/23 72  07/30/23 (!) 54  07/20/23 (!) 55     Labs:?  Lab Results  Component Value Date   HGBA1C 6.8 (H) 08/07/2023   HGBA1C 9.6 (H)  05/02/2023   HGBA1C 8.4 (H) 08/14/2022   GLUCOSE 153 (H) 08/24/2023   MICRALBCREAT 49.6 (H) 08/07/2023   CREATININE 1.53 (H) 08/24/2023   CREATININE 1.55 (H) 08/07/2023   CREATININE 1.16 07/14/2023   GFR 45.03 (L) 08/24/2023   GFR 44.35 (L) 08/07/2023   GFR 68.15 05/06/2014    Lab Results  Component Value Date   CHOL 112 08/14/2022   LDLCALC 57 08/14/2022   LDLCALC 48 03/24/2021   LDLCALC 65 01/27/2020   HDL 32 (L) 08/14/2022   TRIG 125 08/14/2022   TRIG 104 03/24/2021   TRIG 101 01/27/2020   ALT 21 07/10/2023   ALT 9 08/14/2022   AST 31 07/10/2023   AST 11 08/14/2022      Chemistry      Component Value Date/Time   NA 138 08/24/2023 0848   NA 135 05/02/2023 1401   K 4.0 08/24/2023 0848   CL 103 08/24/2023 0848   CO2 28 08/24/2023 0848   BUN 25 (H) 08/24/2023 0848   BUN 18 05/02/2023 1401   CREATININE 1.53 (H) 08/24/2023 0848   CREATININE 1.26 (H) 01/26/2016 0820      Component Value Date/Time   CALCIUM 10.0 08/24/2023 0848   ALKPHOS 48 07/10/2023 1109   AST 31 07/10/2023 1109   ALT 21 07/10/2023 1109   BILITOT 1.2 07/10/2023 1109   BILITOT 1.0 08/14/2022 0958     The ASCVD Risk score (Arnett DK, et al., 2019) failed to calculate for the following reasons:   The valid total cholesterol range is 130 to 320 mg/dL  Assessment and Plan:   1. Diabetes, type 2: uncontrolled per home BG monitoring with FBG consistently >130 mg/dL. Goal A1c <7% without hypoglycemia. Last A1c of 6.8% (08/07/23) in the setting of recent hospitalization with sepsis. Managed on SSI inpatient, low oral intake, also general factors w sepsis such as possible change in RBC turnover, significant increase in inflammatory mediators, etc. may have impact upon A1c.  Renal function not recovered from pre-hospitalization baseline, continue to monitor. Precludes metformin  titration at this time. Ozempic  start next week if shipment arrives on time.   Current Regimen: metformin  500 mg BID Novo PAP  application: Ozempic  (Approved, awaiting delivery to clinic - expected by end of next week)    Medication Considerations: Metformin :  Metformin  titration limited at this time w recent AKI. Repeat BMP shows stable renal function, though significantly lower than pre-hospital baseline.  GLP1-RA: Ideal per benefit of A1c-lowering propensity, CAD risk-reduction, OSA, renal protective, weight loss. No documented hx FLD. LFTs WNL. Hx negative for pancreatitis, person/fam hx med thyroid  carcinoma or MEN2.  SGLT2i: UACR elevated (possibly 2/2 chronic hyperglycemia, though suspect component of acute elevation in the setting of AKI). May benefit from SGLT2i regardless given slowing of CKD progression. Consider at next PCP visit after establishing on Ozempic . Would likely need small reduction in BP medications given low to normal pressures on home monitoring. Consider d/c amlodipine , possibly reduction in diuretic.    2. HTN: controlled based on last clinic BP of 118/60 mmHg (08/07/23), goal <130/80 mmHg. Does not monitor BP at home. Denies lightheadedness, dizziness, SOB, CP, vision changes.  Current Regimen: amlodipine  2.5  mg daily, carvedilol  12.5 mg BID, furosemide  20 mg daily, spironolactone  25 mg daily, lisinopril  40 mg daily Continue medications without changes.    3. ASCVD (secondary prevention): LDL fairly controlled on last lipid panel with LDL 57 mg/dL, TG 874 mg/dL (03/23/73). LDL goal <55 mg/dL (secondary prevention). Due for repeat lipid panel. Pending updated lipid panel, consider transition to rosuvastatin/atorvastatin per secondary prevention.  Key risk factors include: diabetes, hypertension (well-controlled), hyperlipidemia (well-controlled), former smoker, BMI >30 kg/m2, and sedentary lifestyle Current Regimen: simvastatin  5 mg daily Hx: Started on simvastatin  5mg  in 2015, never titrated or escalated.  Continue medications today without changes.    Follow Up Follow up with clinical  pharmacist via phone in ~2 weeks once Ozempic  received from PAP.  Patient given direct line for questions regarding medication therapy and glucose concerns   Future Appointments  Date Time Provider Department Center  08/28/2023  1:00 PM Devra Lands, RN [facility] None  09/06/2023  8:10 AM [facility] ANNUAL WELLNESS VISIT 2 [facility]  10/08/2023  9:00 AM Vincente Shivers, NP [facility]  11/08/2023  8:00 AM Darron Deatrice LABOR, MD [facility] None    Manuelita FABIENE Kobs, PharmD Clinical Pharmacist [facility] [phone]

## 2023-08-28 ENCOUNTER — Telehealth: Payer: Self-pay

## 2023-08-28 ENCOUNTER — Other Ambulatory Visit: Payer: Self-pay | Admitting: Family

## 2023-08-28 DIAGNOSIS — R944 Abnormal results of kidney function studies: Secondary | ICD-10-CM

## 2023-08-28 DIAGNOSIS — N1831 Chronic kidney disease, stage 3a: Secondary | ICD-10-CM

## 2023-08-28 NOTE — Progress Notes (Signed)
 Kidney function still does not have a lot of improvement.  Any urinary symptoms?  I am going to order an ultrasound of the kidneys to look at their function and make sure all is well.  Has pt ever seen a kidney specialist?  If pt could please drop off a urine sample at the office I am also going to check for any infection.   ------------------------------------ I have sent in your order electronically  at this location below. Please call to schedule the appointment at your convenience  [facility] outpatient imaging center off [address], [place] [address] Phone [phone]  8-5 pm

## 2023-09-05 DIAGNOSIS — H43821 Vitreomacular adhesion, right eye: Secondary | ICD-10-CM | POA: Diagnosis not present

## 2023-09-05 DIAGNOSIS — H35372 Puckering of macula, left eye: Secondary | ICD-10-CM | POA: Diagnosis not present

## 2023-09-05 DIAGNOSIS — H43312 Vitreous membranes and strands, left eye: Secondary | ICD-10-CM | POA: Diagnosis not present

## 2023-09-05 DIAGNOSIS — H35352 Cystoid macular degeneration, left eye: Secondary | ICD-10-CM | POA: Diagnosis not present

## 2023-09-05 DIAGNOSIS — Z961 Presence of intraocular lens: Secondary | ICD-10-CM | POA: Diagnosis not present

## 2023-09-06 ENCOUNTER — Ambulatory Visit

## 2023-09-11 ENCOUNTER — Encounter: Payer: Self-pay | Admitting: Pharmacist

## 2023-09-11 NOTE — Progress Notes (Signed)
 Documentation Reason: Patient Assistance Follow up  Summary of Call: Called Novo Nordisk automated system Medication shipment is still in process at this time. Shipment delayed.

## 2023-09-12 ENCOUNTER — Encounter: Payer: Self-pay | Admitting: Pharmacist

## 2023-09-12 ENCOUNTER — Other Ambulatory Visit

## 2023-09-12 NOTE — Progress Notes (Deleted)
 09/12/2023 Name: Jesse Torres MRN: [id number] DOB: 03-02-1950  Subjective  No chief complaint on file.   Reason for visit: ?  ADREN DOLLINS is a 73 y.o. male with a history of diabetes (type 2), who presents today for a follow up diabetes pharmacotherapy visit.? Pertinent PMH also includes HTN, PAH, HF(imp), CAD, OSA, HLD, obesity, macular edema, ertigo.  Known DM Complications: no known complications   Care Team: Primary Care Provider: Vincente Shivers, NP   Date of Last Diabetes Related Visit: with PCP on 08/07/23, with PharmD 08/10/23 Recent Summary of Change: 7/14: PAP submitted, new Ozempic  start.  6/27: ?? metformin  500 mg BID per eGFR <45. Repeat BMP 2 weeks. Ozempic  PAP submitted 6/25: GLP1 cost-prohibitive, did not increase metformin , still taking 500 mg BID. FBG 140-160s.  6/6: Resume metformin  1000 mg BID. Start Mounjaro  2.5 mg weekly.   Medication Access/Adherence: Prescription drug coverage: Payor: [employer] MEDICARE / Plan: [employer] MEDICARE HMO / Product Type: *No Product type* / .  - Current Patient Assistance: Novo (Ozempic ) - Medication Adherence: Patient denies missing doses of their medication.  History of Present Illness: ?  Hospitalization w discharge 5/31 cc CAP c/b septic shock and AKI.  Diabetes w A1c 9.6% (05/02/23). Managed on SSI while inpatient. Low oral intake. Improved A1c on recheck though w SMBG suggesting uncontrolled DM.   - ozempic  PAP delivered - AKI?? Any s/sx urinary. Renal tests ordered.   [facility] outpatient imaging center off [address], [place] [address] Phone [phone]-  8-5 pm   Since Last visit: Patient reports implementing plan from last visit. Using metformin  at half dose, 500 mg twice daily. Since last visit, has been approved for Novo PAP though still awaiting shipment of Ozempic  at this time. Delivery w/i 10-14 business days should be by end of next week.   Reported DM Regimen: ?  Metformin  500  mg twice daily    SMBG: via glucometer ? FBG have ranged 140s-160s g/dL most days. Occasionally higher if carb-heavy evening meal (pasta) Afternoons as low as 110s, sometimes higher.   Hypo/Hyperglycemia: ?  Symptoms of hypoglycemia since last visit:? no  Symptoms of hyperglycemia since last visit:? no  Reported Diet: Patient typically eats 3 meals per day.  Breakfast: 2 eggs with cheese Lunch: Usually eats at home Dinner: Usually eats at home. Spam, hamburger. Green beans, pinto beans. Avoids rice. Beverages: A lot more water. Occasional Diet soda. Coffee with small amount of cream.   Exercise: No formal exercise  DM Prevention:  Statin: Taking; low intensity History of chronic kidney disease? no History of albuminuria? yes, last UACR on 08/07/23 = 49.6 mg/g ACE/ARB - Taking lisinopril  40 mg daily; Urine MA/CR Ratio - elevated urinary albumin  excretion.  Last eye exam: 12/03/19; Retinopathy Present - DUE Last foot exam: 08/07/2023 Tobacco Use: Former smoker   Cardiovascular Risk Reduction History of clinical ASCVD? yes History of heart failure? yes (improved EF) History of hyperlipidemia? yes Current BMI: 30.5 kg/m2 (Ht 67 in, Wt 88.5 kg) Taking statin? yes; low intensity (simvastatin  5mg ) Taking aspirin ? indicated (secondary prevention); Not taking   Taking SGLT-2i? no Taking GLP- 1 RA? no   Checking BP at home intermittently. Reports consistent readings ranging ~100s-120s/60s-70s.       _______________________________________________  Objective    Review of Systems:? Limited in the setting of virtual visit GI:? No nausea, vomiting, constipation, diarrhea, abdominal pain, dyspepsia, change in bowel habits  Endocrine:? No polyuria, polyphagia or blurred  vision    Physical Examination:  Vitals:  Wt Readings from Last 3 Encounters:  08/07/23 195 lb (88.5 kg)  07/30/23 194 lb (88 kg)  07/20/23 204 lb (92.5 kg)   BP Readings from Last 3 Encounters:  08/07/23 118/60   07/30/23 133/72  07/20/23 118/70   Pulse Readings from Last 3 Encounters:  08/07/23 72  07/30/23 (!) 54  07/20/23 (!) 55     Labs:?  Lab Results  Component Value Date   HGBA1C 6.8 (H) 08/07/2023   HGBA1C 9.6 (H) 05/02/2023   HGBA1C 8.4 (H) 08/14/2022   GLUCOSE 153 (H) 08/24/2023   MICRALBCREAT 49.6 (H) 08/07/2023   CREATININE 1.53 (H) 08/24/2023   CREATININE 1.55 (H) 08/07/2023   CREATININE 1.16 07/14/2023   GFR 45.03 (L) 08/24/2023   GFR 44.35 (L) 08/07/2023   GFR 68.15 05/06/2014    Lab Results  Component Value Date   CHOL 112 08/14/2022   LDLCALC 57 08/14/2022   LDLCALC 48 03/24/2021   LDLCALC 65 01/27/2020   HDL 32 (L) 08/14/2022   TRIG 125 08/14/2022   TRIG 104 03/24/2021   TRIG 101 01/27/2020   ALT 21 07/10/2023   ALT 9 08/14/2022   AST 31 07/10/2023   AST 11 08/14/2022      Chemistry      Component Value Date/Time   NA 138 08/24/2023 0848   NA 135 05/02/2023 1401   K 4.0 08/24/2023 0848   CL 103 08/24/2023 0848   CO2 28 08/24/2023 0848   BUN 25 (H) 08/24/2023 0848   BUN 18 05/02/2023 1401   CREATININE 1.53 (H) 08/24/2023 0848   CREATININE 1.26 (H) 01/26/2016 0820      Component Value Date/Time   CALCIUM 10.0 08/24/2023 0848   ALKPHOS 48 07/10/2023 1109   AST 31 07/10/2023 1109   ALT 21 07/10/2023 1109   BILITOT 1.2 07/10/2023 1109   BILITOT 1.0 08/14/2022 0958     The ASCVD Risk score (Arnett DK, et al., 2019) failed to calculate for the following reasons:   The valid total cholesterol range is 130 to 320 mg/dL  Assessment and Plan:   1. Diabetes, type 2: uncontrolled per home BG monitoring with FBG consistently >130 mg/dL. Goal A1c <7% without hypoglycemia. Last A1c of 6.8% (08/07/23) in the setting of recent hospitalization with sepsis. Managed on SSI inpatient, low oral intake, also general factors w sepsis such as possible change in RBC turnover, significant increase in inflammatory mediators, etc. may have impact upon A1c.  Renal  function not recovered from pre-hospitalization baseline, continue to monitor. Precludes metformin  titration at this time. Ozempic  start next week if shipment arrives on time.   Current Regimen: metformin  500 mg BID Novo PAP application: Ozempic  (Approved, awaiting delivery to clinic - expected by end of next week)    Medication Considerations: Metformin :  Metformin  titration limited at this time w recent AKI. Repeat BMP shows stable renal function, though significantly lower than pre-hospital baseline.  GLP1-RA: Ideal per benefit of A1c-lowering propensity, CAD risk-reduction, OSA, renal protective, weight loss. No documented hx FLD. LFTs WNL. Hx negative for pancreatitis, person/fam hx med thyroid  carcinoma or MEN2.  SGLT2i: UACR elevated (possibly 2/2 chronic hyperglycemia, though suspect component of acute elevation in the setting of AKI). May benefit from SGLT2i regardless given slowing of CKD progression. Consider at next PCP visit after establishing on Ozempic . Would likely need small reduction in BP medications given low to normal pressures on home monitoring. Consider d/c amlodipine , possibly  reduction in diuretic.    2. HTN: controlled based on last clinic BP of 118/60 mmHg (08/07/23), goal <130/80 mmHg. Does not monitor BP at home. Denies lightheadedness, dizziness, SOB, CP, vision changes.  Current Regimen: amlodipine  2.5 mg daily, carvedilol  12.5 mg BID, furosemide  20 mg daily, spironolactone  25 mg daily, lisinopril  40 mg daily Continue medications without changes.    3. ASCVD (secondary prevention): LDL fairly controlled on last lipid panel with LDL 57 mg/dL, TG 874 mg/dL (03/23/73). LDL goal <55 mg/dL (secondary prevention). Due for repeat lipid panel. Pending updated lipid panel, consider transition to rosuvastatin/atorvastatin per secondary prevention.  Key risk factors include: diabetes, hypertension (well-controlled), hyperlipidemia (well-controlled), former smoker, BMI >30 kg/m2, and  sedentary lifestyle Current Regimen: simvastatin  5 mg daily Hx: Started on simvastatin  5mg  in 2015, never titrated or escalated.  Continue medications today without changes.    Follow Up Follow up with clinical pharmacist via phone in ~2 weeks once Ozempic  received from PAP.  Patient given direct line for questions regarding medication therapy and glucose concerns   Future Appointments  Date Time Provider Department Center  09/12/2023  4:00 PM [facility] PHARMACIST [facility]  10/08/2023  9:00 AM Vincente Shivers, NP [facility]  11/08/2023  8:00 AM Darron Deatrice LABOR, MD [facility] None    Manuelita FABIENE Kobs, PharmD Clinical Pharmacist [facility] [phone]

## 2023-09-12 NOTE — Progress Notes (Signed)
 Attempted to contact patient for medication management. Left HIPAA compliant message for patient to return my call at their convenience.   Patient needs phone visit with pharmD for DM2 follow up to discuss Ozempic  initiation Ozempic  has been delivered to clinic from Novo patient assistance program (new start) Renal ultrasound ordered, patient must call imaging to schedule.   [facility] outpatient imaging center [address] Phone [phone]  8-5 pm

## 2023-09-27 ENCOUNTER — Telehealth: Payer: Self-pay | Admitting: General Practice

## 2023-09-27 NOTE — Telephone Encounter (Signed)
 Noted

## 2023-09-27 NOTE — Telephone Encounter (Signed)
 Pt came in and picked up medications

## 2023-10-08 ENCOUNTER — Ambulatory Visit: Admitting: General Practice

## 2023-10-08 DIAGNOSIS — E1165 Type 2 diabetes mellitus with hyperglycemia: Secondary | ICD-10-CM

## 2023-10-19 ENCOUNTER — Telehealth: Payer: Self-pay

## 2023-10-19 NOTE — Telephone Encounter (Signed)
 Patient no showed for his follow up and patient needs to be rescheduled to discuss his medications and DM. Patient also no showed appt with pharmacist and will need that rescheduled as well. Please call patient to schedule; if patient does not answer may need to contact his daughter Rexene.

## 2023-10-19 NOTE — Telephone Encounter (Signed)
 I called patient and his daughter Rexene and both of them didn't picked up, I left a voicemail to be scheduled.

## 2023-10-29 NOTE — Telephone Encounter (Signed)
 Called pt wasn't able to leave voice mail

## 2023-11-06 ENCOUNTER — Encounter: Payer: Self-pay | Admitting: General Practice

## 2023-11-06 NOTE — Telephone Encounter (Signed)
 Called unable to leave message vmb full, sent letter

## 2023-11-08 ENCOUNTER — Ambulatory Visit: Attending: Cardiovascular Disease | Admitting: Cardiovascular Disease

## 2023-11-26 ENCOUNTER — Encounter: Payer: Self-pay | Admitting: General Practice

## 2023-11-26 ENCOUNTER — Ambulatory Visit: Payer: Self-pay | Admitting: General Practice

## 2023-11-26 ENCOUNTER — Ambulatory Visit: Admitting: General Practice

## 2023-11-26 VITALS — BP 112/70 | HR 60 | Temp 98.0°F | Ht 67.0 in | Wt 193.0 lb

## 2023-11-26 DIAGNOSIS — I5032 Chronic diastolic (congestive) heart failure: Secondary | ICD-10-CM

## 2023-11-26 DIAGNOSIS — M25562 Pain in left knee: Secondary | ICD-10-CM | POA: Diagnosis not present

## 2023-11-26 DIAGNOSIS — N1831 Chronic kidney disease, stage 3a: Secondary | ICD-10-CM | POA: Diagnosis not present

## 2023-11-26 DIAGNOSIS — E0822 Diabetes mellitus due to underlying condition with diabetic chronic kidney disease: Secondary | ICD-10-CM | POA: Insufficient documentation

## 2023-11-26 DIAGNOSIS — Z23 Encounter for immunization: Secondary | ICD-10-CM | POA: Diagnosis not present

## 2023-11-26 DIAGNOSIS — I10 Essential (primary) hypertension: Secondary | ICD-10-CM | POA: Diagnosis not present

## 2023-11-26 DIAGNOSIS — E785 Hyperlipidemia, unspecified: Secondary | ICD-10-CM

## 2023-11-26 DIAGNOSIS — E1165 Type 2 diabetes mellitus with hyperglycemia: Secondary | ICD-10-CM | POA: Diagnosis not present

## 2023-11-26 DIAGNOSIS — Z7985 Long-term (current) use of injectable non-insulin antidiabetic drugs: Secondary | ICD-10-CM | POA: Diagnosis not present

## 2023-11-26 DIAGNOSIS — I2510 Atherosclerotic heart disease of native coronary artery without angina pectoris: Secondary | ICD-10-CM | POA: Diagnosis not present

## 2023-11-26 LAB — COMPREHENSIVE METABOLIC PANEL WITH GFR
ALT: 5 U/L (ref 0–53)
AST: 9 U/L (ref 0–37)
Albumin: 4.2 g/dL (ref 3.5–5.2)
Alkaline Phosphatase: 72 U/L (ref 39–117)
BUN: 15 mg/dL (ref 6–23)
CO2: 31 meq/L (ref 19–32)
Calcium: 9.4 mg/dL (ref 8.4–10.5)
Chloride: 99 meq/L (ref 96–112)
Creatinine, Ser: 1.42 mg/dL (ref 0.40–1.50)
GFR: 49.16 mL/min — ABNORMAL LOW (ref >60.00)
Glucose, Bld: 115 mg/dL — ABNORMAL HIGH (ref 70–99)
Potassium: 4.1 meq/L (ref 3.5–5.1)
Sodium: 137 meq/L (ref 135–145)
Total Bilirubin: 1.1 mg/dL (ref 0.2–1.2)
Total Protein: 7.3 g/dL (ref 6.0–8.3)

## 2023-11-26 LAB — HEMOGLOBIN A1C: Hgb A1c MFr Bld: 6.4 % (ref 4.6–6.5)

## 2023-11-26 NOTE — Assessment & Plan Note (Signed)
 Controlled.  Followed by cardiology.  Continue Simvastatin  5 mg once daily.

## 2023-11-26 NOTE — Patient Instructions (Addendum)
 Jesse Torres, PharmD Clinical Pharmacist [facility] [phone]  Call and f/u with pharmacy, Jesse.   Increase to Ozempic  0.5 mg once weekly after you have completed the 0.25 mg box.  For left knee pain: Start tylenol  arthritis 500 mg one tablet twice daily.   Follow up in three months.   It was a pleasure to see you today!

## 2023-11-26 NOTE — Assessment & Plan Note (Signed)
 Controlled.  Continue Simvastatin  5 mg once daily.  Followed by cardiology.

## 2023-11-26 NOTE — Addendum Note (Signed)
 Addended by: TENNIE RAISIN B on: 11/26/2023 12:06 PM   Modules accepted: Orders

## 2023-11-26 NOTE — Assessment & Plan Note (Signed)
 Controlled. Continue amlodipine  2.5 mg once daily, carvedilol  12.5 twice daily, lasix  20 mg, lisinopril  40 mg once daily, spironolactone  25 mg once daily.   Reviewed cardiology notes from March 2025.

## 2023-11-26 NOTE — Assessment & Plan Note (Signed)
 At goal today.  Followed by cardiology.  Continue Amlodipine , Coreg , Furosemide , Lisinopril  and Spironolactone  per cardiology.

## 2023-11-26 NOTE — Progress Notes (Signed)
 Established Patient Office Visit  Subjective   Patient ID: Jesse Torres, male    DOB: Sep 05, 1950  Age: 73 y.o. MRN: [id number]  Chief Complaint  Patient presents with   Diabetes    Patient following up on diabetes and states its been doing well. Patient on metformin  and ozempic  but unsure which dose he is on right now.     HPI  Jesse Torres is a 73 year old male with past medical history of HTN, CAD, DM2, obesity, HLD presents today for chronic care management.   Discussed the use of AI scribe software for clinical note transcription with the patient, who gave verbal consent to proceed.  History of Present Illness Jesse Torres is a 73 year old male with diabetes who presents for medication management and follow-up.  He is currently on Ozempic  for diabetes management, administered once a week by his daughter every Tuesday morning. He started the medication last week and is on his second box, with three boxes remaining. His blood sugar levels are high in the mornings, around 149 mg/dL, but decrease to 879-876 mg/dL in the afternoons. He has been making better eating choices and maintains regular physical activity through daily work. He receives his medication via the medication assistance program and has missed his appointment with the pharmacist due to technical issues with his phone. He is also taking Metformin  500 mg BID.  HTN/CHF/CAD: : currently managed on amlodipine , Coreg , Lasix , lisinopril , simvastatin , and spironolactone . He is doing well and has no concerns.  Left knee pain: He experiences arthritis pain in his left knee, describing the bone as sore and painful. Heating pads provide some relief. He has been taking Advil for the pain.    . Patient Active Problem List   Diagnosis Date Noted   Diabetes mellitus due to underlying condition with chronic kidney disease, without long-term current use of insulin , unspecified CKD stage (HCC) 11/26/2023   Stage 3a chronic  kidney disease (HCC) 11/26/2023   Acute pain of left knee 11/26/2023   Onychomycosis 08/07/2023   Hospital discharge follow-up 07/20/2023   Vertigo 07/20/2023   Uncontrolled type 2 diabetes mellitus with hyperglycemia (HCC) 09/26/2021   Cystoid macular edema of left eye 12/08/2019   Postherpetic neuralgia 02/04/2015   PVC's (premature ventricular contractions) 02/16/2014   CAD (coronary artery disease) 05/05/2013   Dyslipidemia 03/11/2013   Cardiomyopathy (HCC)    HFimpEF (heart failure with improved EF    Thrombocytopenia    Right ventricular dysfunction 02/06/2013   Pulmonary hypertension (HCC) 02/06/2013   RBBB (right bundle branch block) 02/06/2013   Essential hypertension 02/05/2013   Sleep apnea, obstructive 02/05/2013   Obesity 02/05/2013   Past Medical History:  Diagnosis Date   Cardiomyopathy (HCC)    probable HTN cardiomyopathy;  Lexiscan  Myoview (02/07/2013): EF 34%, old small apical infarct, no ischemia, moderate apical hypokinesis.   Chronic systolic CHF (congestive heart failure) (HCC)    a. Echocardiogram (02/05/2013): Mild LVH, EF 25-30%, diffuse HK, mild MR, mild LAE, mild RVE, moderately reduced RVSF, mild RAE, PASP 64, trivial effusion.;  b.  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   Vitrectomy 02-18-2020   Vitreous membranes and strands, left 02/19/2020   Contributing to pseudophakic CME in a scleral fixated posterior chamber lens  Vitrectomy 02-18-20, removal of cortical vitreous   Past Surgical History:  Procedure Laterality Date   Cataract surgery     No Known Allergies       11/26/2023   10:46 AM 08/07/2023     9:38 AM 07/30/2023    1:35 PM  Depression screen PHQ 2/9  Decreased Interest 0 0 0  Down, Depressed, Hopeless 0 0 0  PHQ - 2 Score 0 0 0  Altered sleeping 0 0   Tired, decreased energy 0 0   Change in appetite 0 0   Feeling bad or failure about yourself  0 0   Trouble concentrating 0 0   Moving slowly or fidgety/restless 0 0   Suicidal thoughts 0 0   PHQ-9 Score 0 0   Difficult doing work/chores Not difficult at all Not difficult at all        11/26/2023   10:46 AM 08/07/2023    9:38 AM 06/18/2023   12:24 PM 08/08/2018    3:53 PM  GAD 7 : Generalized Anxiety Score  Nervous, Anxious, on Edge 0 0 0 0  Control/stop worrying 0 0 0 0  Worry too much - different things 0 0 0 0  Trouble relaxing 0 0 0 0  Restless 0 0 0 0  Easily annoyed or irritable 0 0 0 0  Afraid - awful might happen 0 0 0 0  Total GAD 7 Score 0 0 0 0  Anxiety Difficulty Not difficult at all Not difficult at all Not difficult at all Not difficult at all      Review of Systems  Constitutional:  Negative for chills and fever.  Respiratory:  Negative for shortness of breath.   Cardiovascular:  Negative for chest pain.  Gastrointestinal:  Negative for abdominal pain, constipation, diarrhea, heartburn, nausea and vomiting.  Genitourinary:  Negative for dysuria, frequency and urgency.  Neurological:  Negative for dizziness and headaches.  Endo/Heme/Allergies:  Negative for polydipsia.  Psychiatric/Behavioral:  Negative for depression and suicidal ideas. The patient is not nervous/anxious.       Objective:     BP 112/70   Pulse 60   Temp 98 F (36.7 C) (Oral)   Ht 5' 7 (1.702 m)   Wt 193 lb (87.5 kg)   SpO2 98%   BMI 30.23 kg/m  BP Readings from Last 3 Encounters:  11/26/23 112/70  08/07/23 118/60  07/30/23 133/72   Wt Readings from Last 3 Encounters:  11/26/23 193 lb (87.5 kg)  08/07/23 195 lb (88.5 kg)  07/30/23 194 lb (88 kg)      Physical Exam Vitals and nursing note reviewed.   Constitutional:      Appearance: Normal appearance.  Cardiovascular:     Rate and Rhythm: Normal rate and regular rhythm.     Pulses: Normal pulses.     Heart sounds: Normal heart sounds.  Pulmonary:     Effort: Pulmonary effort is normal.     Breath sounds: Normal breath sounds.  Neurological:     Mental Status: He is alert and oriented to person, place, and time.  Psychiatric:        Mood and Affect: Mood normal.        Behavior: Behavior normal.        Thought Content:  Thought content normal.        Judgment: Judgment normal.      No results found for any visits on 11/26/23.     The ASCVD Risk score (Arnett DK, et al., 2019) failed to calculate for the following reasons:   The valid total cholesterol range is 130 to 320 mg/dL    Assessment & Plan:  Uncontrolled type 2 diabetes mellitus with hyperglycemia (HCC) -     Hemoglobin A1c  Stage 3a chronic kidney disease (HCC) -     Comprehensive metabolic panel with GFR  Diabetes mellitus due to underlying condition with chronic kidney disease, without long-term current use of insulin , unspecified CKD stage (HCC) Assessment & Plan: A1c pending.  CMP pending.  Foot exam- UTD.  Pneumonia vaccine - UTD.  Urine ACR- UTD Eye exam- UTD  Continue Ozempic  0.25 mg once weekly until completed then increase to 0.5 mg once weekly.  Continue Metformin  XR 500 mg BID.   Schedule f/u with pharmacist. Phone number provided.   F/u in office in three months.    Essential hypertension Assessment & Plan: At goal today.  Followed by cardiology.  Continue Amlodipine , Coreg , Furosemide , Lisinopril  and Spironolactone  per cardiology.   Coronary artery disease, unspecified vessel or lesion type, unspecified whether angina present, unspecified whether native or transplanted heart Assessment & Plan: Controlled.  Followed by cardiology.  Continue Simvastatin  5 mg once daily.   Chronic heart failure with preserved ejection fraction  (HFpEF) (HCC) Assessment & Plan: Controlled. Continue amlodipine  2.5 mg once daily, carvedilol  12.5 twice daily, lasix  20 mg, lisinopril  40 mg once daily, spironolactone  25 mg once daily.    Reviewed cardiology notes from March 2025.     Dyslipidemia Assessment & Plan: Controlled.  Continue Simvastatin  5 mg once daily.  Followed by cardiology.   Acute pain of left knee    Assessment and Plan Assessment & Plan Type 2 diabetes mellitus with hyperglycemia Elevated morning glucose levels, improving with dietary changes and activity. On Ozempic . - CMP, A1c pending. - Continue Ozempic  0.25 mg once weekly until you finish the box then increase to 0.5 mg once weekly.  Chronic kidney disease, stage 3a Stage 3a CKD under monitoring. - CMP pending.  Left knee pain Arthritis-related pain managed with heating pads.  - Advised to discontinue advil given CKD. - Recommend Tylenol  Arthritis 500 mg, one tablet twice a day. - Consider orthopedic referral if pain persists.   Return in about 3 months (around 02/26/2024) for DM2 and chronic care management.SABRA Carrol Aurora, NP

## 2023-11-26 NOTE — Assessment & Plan Note (Signed)
 A1c pending.  CMP pending.  Foot exam- UTD.  Pneumonia vaccine - UTD.  Urine ACR- UTD Eye exam- UTD  Continue Ozempic  0.25 mg once weekly until completed then increase to 0.5 mg once weekly.  Continue Metformin  XR 500 mg BID.   Schedule f/u with pharmacist. Phone number provided.   F/u in office in three months.

## 2023-12-11 ENCOUNTER — Ambulatory Visit (INDEPENDENT_AMBULATORY_CARE_PROVIDER_SITE_OTHER)

## 2023-12-11 VITALS — BP 124/80 | Ht 67.0 in | Wt 191.8 lb

## 2023-12-11 DIAGNOSIS — Z0000 Encounter for general adult medical examination without abnormal findings: Secondary | ICD-10-CM | POA: Diagnosis not present

## 2023-12-11 NOTE — Patient Instructions (Addendum)
 Jesse Torres,  Thank you for taking the time for your Medicare Wellness Visit. I appreciate your continued commitment to your health goals. Please review the care plan we discussed, and feel free to reach out if I can assist you further.  Medicare recommends these wellness visits once per year to help you and your care team stay ahead of potential health issues. These visits are designed to focus on prevention, allowing your provider to concentrate on managing your acute and chronic conditions during your regular appointments.  Please note that Annual Wellness Visits do not include a physical exam. Some assessments may be limited, especially if the visit was conducted virtually. If needed, we may recommend a separate in-person follow-up with your provider.  Ongoing Care Seeing your primary care provider every 3 to 6 months helps us  monitor your health and provide consistent, personalized care.   Referrals If a referral was made during today's visit and you haven't received any updates within two weeks, please contact the referred provider directly to check on the status.  Recommended Screenings:  Health Maintenance  Topic Date Due   Zoster (Shingles) Vaccine (1 of 2) Never done   Colon Cancer Screening  11/25/2024*   COVID-19 Vaccine (4 - 2025-26 season) 12/09/2026*   Hemoglobin A1C  05/26/2024   Yearly kidney health urinalysis for diabetes  08/06/2024   Complete foot exam   08/06/2024   Eye exam for diabetics  09/04/2024   Yearly kidney function blood test for diabetes  11/25/2024   Medicare Annual Wellness Visit  12/10/2024   DTaP/Tdap/Td vaccine (2 - Td or Tdap) 04/07/2025   Pneumococcal Vaccine for age over 73  Completed   Flu Shot  Completed   Hepatitis C Screening  Completed   Meningitis B Vaccine  Aged Out  *Topic was postponed. The date shown is not the original due date.       12/11/2023    9:40 AM  Advanced Directives  Does Patient Have a Medical Advance Directive? No    Advance Care Planning is important because it: Ensures you receive medical care that aligns with your values, goals, and preferences. Provides guidance to your family and loved ones, reducing the emotional burden of decision-making during critical moments.  Vision: Annual vision screenings are recommended for early detection of glaucoma, cataracts, and diabetic retinopathy. These exams can also reveal signs of chronic conditions such as diabetes and high blood pressure.  Dental: Annual dental screenings help detect early signs of oral cancer, gum disease, and other conditions linked to overall health, including heart disease and diabetes.

## 2023-12-11 NOTE — Progress Notes (Signed)
 Subjective:   Jesse Torres is a 73 y.o. who presents for a Medicare Wellness preventive visit.  As a reminder, Annual Wellness Visits don't include a physical exam, and some assessments may be limited, especially if this visit is performed virtually. We may recommend an in-person follow-up visit with your provider if needed.  Visit Complete: In person  Persons Participating in Visit: Patient.  AWV Questionnaire: No: Patient Medicare AWV questionnaire was not completed prior to this visit.  Cardiac Risk Factors include: advanced age (>80men, >26 women);dyslipidemia;hypertension;male gender;obesity (BMI >30kg/m2);sedentary lifestyle     Objective:    Today's Vitals   12/11/23 0928  BP: 124/80  Weight: 191 lb 12.8 oz (87 kg)  Height: 5' 7 (1.702 m)   Body mass index is 30.04 kg/m.     12/11/2023    9:40 AM 07/30/2023    2:47 PM 07/10/2023   11:04 AM 05/25/2023    3:52 PM 04/08/2015   11:15 AM 02/04/2015    9:30 AM 01/04/2015    2:18 PM  Advanced Directives  Does Patient Have a Medical Advance Directive? No No No No No  No  No   Would patient like information on creating a medical advance directive?  No - Patient declined No - Patient declined No - Patient declined        Data saved with a previous flowsheet row definition    Current Medications (verified) Outpatient Encounter Medications as of 12/11/2023  Medication Sig   acetaminophen  (TYLENOL ) 500 MG tablet Take 1 tablet (500 mg total) by mouth every 6 (six) hours as needed.   amLODipine  (NORVASC ) 2.5 MG tablet Take 1 tablet (2.5 mg total) by mouth daily.   carvedilol  (COREG ) 12.5 MG tablet Take 1 tablet (12.5 mg total) by mouth 2 (two) times daily with a meal.   furosemide  (LASIX ) 20 MG tablet Take 1 tablet (20 mg total) by mouth daily.   glucose blood (ACCU-CHEK GUIDE TEST) test strip Use as instructed to check blood sugar. Dx. E11.9   ketorolac  (ACULAR ) 0.5 % ophthalmic solution Place 1 drop into the left eye  daily.   lisinopril  (ZESTRIL ) 40 MG tablet Take 1 tablet (40 mg total) by mouth daily.   meclizine  (ANTIVERT ) 12.5 MG tablet Take 1 tablet (12.5 mg total) by mouth 3 (three) times daily as needed for dizziness.   metFORMIN  (GLUCOPHAGE ) 500 MG tablet Take 1 tablet (500 mg total) by mouth 2 (two) times daily with a meal.   simvastatin  (ZOCOR ) 5 MG tablet Take 1 tablet (5 mg total) by mouth daily.   spironolactone  (ALDACTONE ) 25 MG tablet Take 1 tablet (25 mg total) by mouth daily.   No facility-administered encounter medications on file as of 12/11/2023.    Allergies (verified) Patient has no known allergies.   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   Chronic systolic CHF (congestive heart failure) (HCC)    a. Echocardiogram (02/05/2013): Mild LVH, EF 25-30%, diffuse HK, mild MR, mild LAE, mild RVE, moderately reduced RVSF, mild RAE, PASP 64, trivial effusion.;  b.  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   Vitrectomy 02-18-2020   Vitreous membranes and strands, left 02/19/2020   Contributing to pseudophakic CME in a scleral fixated posterior chamber lens  Vitrectomy 02-18-20, removal of cortical vitreous   Past Surgical History:  Procedure Laterality Date   Cataract surgery     Family History  Problem Relation Age of Onset   Heart attack Mother    Stroke Father    Social History   Socioeconomic History   Marital status: Married    Spouse name: Not on file   Number of  children: Not on file   Years of education: Not on file   Highest education level: Not on file  Occupational History   Not on file  Tobacco Use   Smoking status: Former    Current packs/day: 0.00    Types: Cigarettes    Quit date: 02/06/1995    Years since quitting: 28.8   Smokeless tobacco: Never  Vaping Use   Vaping status: Unknown  Substance and Sexual Activity   Alcohol use: No   Drug use: No   Sexual activity: Not Currently  Other Topics Concern   Not on file  Social History Narrative   Not on file   Social Drivers of Health   Financial Resource Strain: Low Risk  (12/11/2023)   Overall Financial Resource Strain (CARDIA)    Difficulty of Paying Living Expenses: Not hard at all  Food Insecurity: No Food Insecurity (12/11/2023)   Hunger Vital Sign    Worried About Running Out of Food in the Last Year: Never true    Ran Out of Food in the Last Year: Never true  Transportation Needs: No Transportation Needs (12/11/2023)   PRAPARE - Administrator, Civil Service (Medical): No    Lack of Transportation (Non-Medical): No  Physical Activity: Inactive (12/11/2023)   Exercise Vital Sign    Days of Exercise per Week: 0 days    Minutes of Exercise per Session: 0 min  Stress: No Stress Concern Present (12/11/2023)   [employer] of Occupational Health - Occupational Stress Questionnaire    Feeling of Stress: Not at all  Social Connections: Moderately Isolated (12/11/2023)   Social Connection and Isolation Panel    Frequency of Communication with Friends and Family: More than three times a week    Frequency of Social Gatherings with Friends and Family: More than three times a week    Attends Religious Services: 1 to 4 times per year    Active Member of [employer] or Organizations: No    Attends Engineer, Structural: Never    Marital Status: Divorced    Tobacco Counseling Counseling given: Not Answered   Clinical Intake:  Pre-visit preparation  completed: Yes  Pain : No/denies pain    BMI - recorded: 30.04 Nutritional Status: BMI > 30  Obese Nutritional Risks: None Diabetes: No  Lab Results  Component Value Date   HGBA1C 6.4 11/26/2023   HGBA1C 6.8 (H) 08/07/2023   HGBA1C 9.6 (H) 05/02/2023     How often do you need to have someone help you when you read instructions, pamphlets, or other written materials from your doctor or pharmacy?: 1 - Never  Interpreter Needed?: No  Comments: lives alone Information entered by :: B.Mizani Dilday,LPN   Activities of Daily Living     12/11/2023  9:40 AM 07/10/2023    4:25 PM  In your present state of health, do you have any difficulty performing the following activities:  Hearing? 1 0  Vision? 0 0  Difficulty concentrating or making decisions? 0 0  Walking or climbing stairs? 0   Dressing or bathing? 0   Doing errands, shopping? 0 0  Preparing Food and eating ? N   Using the Toilet? N   In the past six months, have you accidently leaked urine? N   Do you have problems with loss of bowel control? N   Managing your Medications? N   Managing your Finances? N   Housekeeping or managing your Housekeeping? N     Patient Care Team: Vincente Shivers, NP as PCP - General (General Practice) Waddell Raisin, RN as Registered Nurse Elner Arley LABOR, MD as Consulting Physician (Ophthalmology) Octavia, Charlie Hamilton, MD as Consulting Physician (Ophthalmology)  I have updated your Care Teams any recent Medical Services you may have received from other providers in the past year.     Assessment:   This is a routine wellness examination for [place].  Hearing/Vision screen Hearing Screening - Comments:: Patient says some hearing difficulties. In rt ear   Vision Screening - Comments:: Pt says their vision is good without glasses; readers only;cataract removed Dr  Rankin;Dr Hamilton Octavia   Goals Addressed             This Visit's Progress    Patient Stated       No new goals        Depression Screen     12/11/2023    9:37 AM 11/26/2023   10:46 AM 08/07/2023    9:38 AM 07/30/2023    1:35 PM 06/27/2023    2:04 PM 06/18/2023   12:23 PM 08/08/2018    3:52 PM  PHQ 2/9 Scores  PHQ - 2 Score 0 0 0 0 0 0 0  PHQ- 9 Score  0 0   0 0    Fall Risk     12/11/2023    9:34 AM 11/26/2023   10:46 AM 08/07/2023    9:37 AM 07/30/2023    1:34 PM 06/18/2023   12:23 PM  Fall Risk   Falls in the past year? 0 0 0 0 0  Number falls in past yr: 0 0 0 0 0  Injury with Fall? 0 0 0 0 0  Risk for fall due to : No Fall Risks;Impaired balance/gait No Fall Risks No Fall Risks Impaired balance/gait No Fall Risks  Risk for fall due to: Comment    Occasional vertigo, has meclazine prn with improvement   Follow up Education provided;Falls prevention discussed Falls evaluation completed Falls evaluation completed  Falls evaluation completed    MEDICARE RISK AT HOME:  Medicare Risk at Home Any stairs in or around the home?: Yes If so, are there any without handrails?: Yes Home free of loose throw rugs in walkways, pet beds, electrical cords, etc?: Yes Adequate lighting in your home to reduce risk of falls?: Yes Life alert?: No Use of a cane, walker or w/c?: No Grab bars in the bathroom?: No Shower chair or bench in shower?: No Elevated toilet seat or a handicapped toilet?: No  TIMED UP AND GO:  Was the test performed?  Yes  Length of time to ambulate 10 feet: 11 sec Gait steady and fast without use of assistive device  Cognitive Function: 6CIT completed        12/11/2023  9:42 AM  6CIT Screen  What Year? 0 points  What month? 0 points  What time? 0 points  Count back from 20 0 points  Months in reverse 0 points    Immunizations Immunization History  Administered Date(s) Administered   INFLUENZA, HIGH DOSE SEASONAL PF 11/26/2023   Influenza Split 02/05/2013   Influenza,inj,Quad PF,6+ Mos 01/04/2015   PFIZER(Purple Top)SARS-COV-2 Vaccination 05/01/2019, 05/22/2019,  12/07/2019   PNEUMOCOCCAL CONJUGATE-20 06/18/2023   Pneumococcal Polysaccharide-23 02/05/2013   Tdap 04/08/2015    Screening Tests Health Maintenance  Topic Date Due   Zoster Vaccines- Shingrix (1 of 2) Never done   Colonoscopy  11/25/2024 (Originally 10/19/1995)   COVID-19 Vaccine (4 - 2025-26 season) 12/09/2026 (Originally 10/15/2023)   HEMOGLOBIN A1C  05/26/2024   Diabetic kidney evaluation - Urine ACR  08/06/2024   FOOT EXAM  08/06/2024   OPHTHALMOLOGY EXAM  09/04/2024   Diabetic kidney evaluation - eGFR measurement  11/25/2024   Medicare Annual Wellness (AWV)  12/10/2024   DTaP/Tdap/Td (2 - Td or Tdap) 04/07/2025   Pneumococcal Vaccine: 50+ Years  Completed   Influenza Vaccine  Completed   Hepatitis C Screening  Completed   Meningococcal B Vaccine  Aged Out    Health Maintenance Items Addressed:   Additional Screening:  Vision Screening: Recommended annual ophthalmology exams for early detection of glaucoma and other disorders of the eye. Is the patient up to date with their annual eye exam?  Yes  Who is the provider or what is the name of the office in which the patient attends annual eye exams? Dr Octavia  Dental Screening: Recommended annual dental exams for proper oral hygiene  Community Resource Referral / Chronic Care Management: CRR required this visit?  No   CCM required this visit?  No   Plan:    I have personally reviewed and noted the following in the patient's chart:   Medical and social history Use of alcohol, tobacco or illicit drugs  Current medications and supplements including opioid prescriptions. Patient is not currently taking opioid prescriptions. Functional ability and status Nutritional status Physical activity Advanced directives List of other physicians Hospitalizations, surgeries, and ER visits in previous 12 months Vitals Screenings to include cognitive, depression, and falls Referrals and appointments  In addition, I have reviewed  and discussed with patient certain preventive protocols, quality metrics, and best practice recommendations. A written personalized care plan for preventive services as well as general preventive health recommendations were provided to patient.   Erminio LITTIE Saris, LPN   [id number]   After Visit Summary: (In Person-Printed) AVS printed and given to the patient  Notes: Nothing significant to report at this time.

## 2024-01-17 ENCOUNTER — Other Ambulatory Visit: Payer: Self-pay | Admitting: Cardiovascular Disease

## 2024-01-17 DIAGNOSIS — I5022 Chronic systolic (congestive) heart failure: Secondary | ICD-10-CM

## 2024-01-17 NOTE — Telephone Encounter (Signed)
Please contact pt for future appointment. Pt overdue for follow up.

## 2024-01-25 ENCOUNTER — Ambulatory Visit: Attending: Cardiovascular Disease | Admitting: Cardiovascular Disease

## 2024-01-25 ENCOUNTER — Encounter: Payer: Self-pay | Admitting: Cardiovascular Disease

## 2024-01-25 VITALS — BP 100/72 | HR 117 | Ht 67.0 in | Wt 194.5 lb

## 2024-01-25 DIAGNOSIS — E785 Hyperlipidemia, unspecified: Secondary | ICD-10-CM | POA: Diagnosis not present

## 2024-01-25 DIAGNOSIS — I10 Essential (primary) hypertension: Secondary | ICD-10-CM | POA: Diagnosis not present

## 2024-01-25 DIAGNOSIS — I5022 Chronic systolic (congestive) heart failure: Secondary | ICD-10-CM

## 2024-01-25 DIAGNOSIS — I4891 Unspecified atrial fibrillation: Secondary | ICD-10-CM

## 2024-01-25 MED ORDER — APIXABAN 5 MG PO TABS: 5.0000 mg | ORAL_TABLET | Freq: Two times a day (BID) | ORAL | 3 refills | Status: DC

## 2024-01-25 MED ORDER — METOPROLOL TARTRATE 50 MG PO TABS: 50.0000 mg | ORAL_TABLET | Freq: Two times a day (BID) | ORAL | 3 refills | Status: AC

## 2024-01-25 NOTE — Patient Instructions (Signed)
 Medication Instructions:  - STOP norvasc   - STOP coreg  - START metoprolol  tartrate 50 mg twice daily - START eliquis 5 mg twice daily   *If you need a refill on your cardiac medications before your next appointment, please call your pharmacy*  Lab Work: No labs ordered today  If you have labs (blood work) drawn today and your tests are completely normal, you will receive your results only by: MyChart Message (if you have MyChart) OR A paper copy in the mail If you have any lab test that is abnormal or we need to change your treatment, we will call you to review the results.  Testing/Procedures: No test ordered today   Follow-Up: At [facility], you and your health needs are our priority.  As part of our continuing mission to provide you with exceptional heart care, our providers are all part of one team.  This team includes your primary Cardiologist (physician) and Advanced Practice Providers or APPs (Physician Assistants and Nurse Practitioners) who all work together to provide you with the care you need, when you need it.  Your next appointment:   1 month(s)  Provider:   You may see Dr Darron or one of the following Advanced Practice Providers on your designated Care Team:   Lonni Meager, NP Lesley Maffucci, PA-C Bernardino Bring, PA-C Cadence Media, PA-C Tylene Lunch, NP Barnie Hila, NP    We recommend signing up for the patient portal called MyChart.  Sign up information is provided on this After Visit Summary.  MyChart is used to connect with patients for Virtual Visits (Telemedicine).  Patients are able to view lab/test results, encounter notes, upcoming appointments, etc.  Non-urgent messages can be sent to your provider as well.   To learn more about what you can do with MyChart, go to [email or web address].

## 2024-01-25 NOTE — Progress Notes (Signed)
 Cardiology Office Note   Date:  01/25/2024   ID:  Jesse Torres, DOB 12/20/1950, MRN [id number]  PCP:  Jesse Shivers, NP  Cardiologist:   Deatrice Cage, MD   Chief Complaint  Patient presents with   Follow-up    OD 6 Month f/u no complaints today. Meds reviewed verbally with pt.      History of Present Illness: Jesse Torres is a 73 y.o. male who presents for a follow-up visit regarding heart failure with improved ejection fraction.  He has not been seen by me before.  He has history of heart failure with improved ejection fraction thought to be due to hypertension, essential hypertension, chronic kidney disease, hyperlipidemia, diabetes mellitus, chronic thrombocytopenia and pulmonary hypertension.  He is a previous smoker and quit 25 years ago.  He was hospitalized in 2015 for acute systolic heart failure with an EF of 25 to 30%.  Nuclear stress test showed no evidence of ischemia.  His ejection fraction improved to normal in 2016.  Most recent echocardiogram in May 2025 showed an EF of 55 to 60% with no significant valvular abnormalities.  He has been doing well with no chest pain, shortness of breath or palpitations.  He is noted to be in atrial fibrillation today with mild tachycardia.  He has no prior history of this.  No previous stroke.   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   Chronic systolic CHF (congestive heart failure) (HCC)    a. Echocardiogram (02/05/2013): Mild LVH, EF 25-30%, diffuse HK, mild MR, mild LAE, mild RVE, moderately reduced RVSF, mild RAE, PASP 64, trivial effusion.;  b.  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 30 tablet 0   amLODipine  (NORVASC ) 2.5 MG tablet Take 1 tablet (2.5 mg total) by mouth daily. 90 tablet 3   carvedilol  (COREG ) 12.5 MG tablet Take 1 tablet (12.5 mg total) by mouth 2 (two) times daily with a meal. 180 tablet 3   furosemide  (LASIX ) 20 MG tablet TAKE 1 TABLET (20 MG TOTAL) BY MOUTH DAILY. 90 tablet 0   glucose blood (ACCU-CHEK GUIDE TEST) test strip Use as instructed to check blood sugar. Dx. E11.9 100 each 12   ketorolac  (ACULAR ) 0.5 % ophthalmic solution Place 1 drop into the left eye daily.     lisinopril  (ZESTRIL ) 40 MG tablet Take 1 tablet (40 mg total) by mouth daily. 90 tablet 3   meclizine  (ANTIVERT ) 12.5  MG tablet Take 1 tablet (12.5 mg total) by mouth 3 (three) times daily as needed for dizziness. 30 tablet 0   metFORMIN  (GLUCOPHAGE ) 500 MG tablet Take 1 tablet (500 mg total) by mouth 2 (two) times daily with a meal.     simvastatin  (ZOCOR ) 5 MG tablet Take 1 tablet (5 mg total) by mouth daily. 90 tablet 3   spironolactone  (ALDACTONE ) 25 MG tablet Take 1 tablet (25 mg total) by mouth daily. 90 tablet 3   No current facility-administered medications for this visit.    Allergies:   Patient has no known allergies.    Social History:  The patient  reports that he quit smoking about 28 years ago. His smoking use included cigarettes. He has never used  smokeless tobacco. He reports that he does not drink alcohol and does not use drugs.   Family History:  The patient's family history includes Heart attack in his mother; Stroke in his father.    ROS:  Please see the history of present illness.   Otherwise, review of systems are positive for none.   All other systems are reviewed and negative.    PHYSICAL EXAM: VS:  BP 100/72 (BP Location: Left Arm, Patient Position: Sitting, Cuff Size: Normal)   Pulse (!) 117   Ht 5' 7 (1.702 m)   Wt 194 lb 8 oz (88.2 kg)   SpO2 97%   BMI 30.46 kg/m  , BMI Body mass index is 30.46 kg/m. GEN: Well nourished, well developed, in no acute distress  HEENT: normal  Neck: no JVD, carotid bruits, or masses Cardiac: Irregularly irregular; no murmurs, rubs, or gallops,no edema  Respiratory:  clear to auscultation bilaterally, normal work of breathing GI: soft, nontender, nondistended, + BS MS: no deformity or atrophy  Skin: warm and dry, no rash Neuro:  Strength and sensation are intact Psych: euthymic mood, full affect   EKG:  EKG is ordered today. The ekg ordered today demonstrates : Atrial fibrillation with rapid ventricular response Left axis deviation Right bundle branch block Septal infarct , age undetermined Inferior infarct (cited on or before 04-Feb-2013) When compared with ECG of 10-Jul-2023 10:55, Significant changes have occurred    Recent Labs: 07/10/2023: B Natriuretic Peptide 27.5; TSH 3.004 07/11/2023: Magnesium 2.7 08/07/2023: Hemoglobin 11.9; Platelets 142.0 11/26/2023: ALT 5; BUN 15; Creatinine, Ser 1.42; Potassium 4.1; Sodium 137    Lipid Panel    Component Value Date/Time   CHOL 112 08/14/2022 0958   TRIG 125 08/14/2022 0958   HDL 32 (L) 08/14/2022 0958   CHOLHDL 3.5 08/14/2022 0958   CHOLHDL 3.8 01/26/2016 0820   VLDL 24 01/26/2016 0820   LDLCALC 57 08/14/2022 0958      Wt Readings from Last 3 Encounters:  01/25/24 194 lb 8 oz (88.2 kg)  12/11/23 191 lb 12.8 oz  (87 kg)  11/26/23 193 lb (87.5 kg)          No data to display            ASSESSMENT AND PLAN:  1.  Newly diagnosed atrial fibrillation with RVR: Unknown onset as the patient seems to be asymptomatic.  CHA2DS2-VASc score is 4.  Recommend long-term anticoagulation.  I started Eliquis 5 mg twice daily. His ventricular rate is not controlled.  Thus, I elected to stop amlodipine  and carvedilol  and start metoprolol  to tartrate 50 mg twice daily. Follow-up in 1 month.  If he remains in atrial fibrillation, recommend proceeding with cardioversion.  2.  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  Most recent ejection fraction was normal.  3.  Essential hypertension: His blood pressure is on the low side.  I made changes as outlined above in order to control his ventricular rate.  4.  Chronic kidney disease: Stable overall  5.  Hyperlipidemia: He is currently on simvastatin  given history of diabetes.  Most recent lipid profile showed an LDL of 57.    Disposition:   FU in 1 month.  Signed,  Deatrice Cage, MD  01/25/2024 8:34 AM    [facility]

## 2024-01-28 ENCOUNTER — Telehealth: Payer: Self-pay | Admitting: General Practice

## 2024-01-28 DIAGNOSIS — E1165 Type 2 diabetes mellitus with hyperglycemia: Secondary | ICD-10-CM

## 2024-01-28 NOTE — Telephone Encounter (Signed)
 Pt came by and asked if he can get his ozempic  pens refilled. Jesse Torres would like to receive a call back for any updates.

## 2024-01-28 NOTE — Telephone Encounter (Signed)
 Patient needs ozempic  refilled and I believe he was getting filled by patient assistance. Can you help with his refill?

## 2024-01-29 MED ORDER — OZEMPIC (0.25 OR 0.5 MG/DOSE) 2 MG/1.5ML ~~LOC~~ SOPN: 0.5000 mg | PEN_INJECTOR | SUBCUTANEOUS | 2 refills | Status: DC

## 2024-01-29 NOTE — Addendum Note (Signed)
 Addended by: VINCENTE SHIVERS on: 01/29/2024 09:43 AM   Modules accepted: Orders

## 2024-01-29 NOTE — Telephone Encounter (Signed)
I sent this to [facility]  °

## 2024-01-29 NOTE — Telephone Encounter (Signed)
 Patient notified and advised to call back if too expensive to pick up. Patient verbalized understanding.

## 2024-01-29 NOTE — Telephone Encounter (Signed)
 Patient needs ozempic  0.5 mg pens refilled; patient assistance was discontinued for ozempic  and will need to be sent to normal pharmacy if possible.

## 2024-02-13 ENCOUNTER — Other Ambulatory Visit: Admitting: Pharmacist

## 2024-02-13 DIAGNOSIS — E0822 Diabetes mellitus due to underlying condition with diabetic chronic kidney disease: Secondary | ICD-10-CM

## 2024-02-13 NOTE — Progress Notes (Signed)
 "  02/13/2024 Name: Jesse Torres MRN: [id number] DOB: 04-May-1950  Subjective   Reason for visit: ?  Jesse Torres is a 73 y.o. male with a history of diabetes (type 2), who presents today for a follow up diabetes pharmacotherapy visit to discuss medication access for 2026.?   Medication Access/Adherence: Prescription drug coverage: Payor: HU[employer] MEDICARE / Plan: [employer] MEDICARE HMO / Product Type: *No Product type* / .  - Current Patient Assistance: Novo (Ozempic )  Since Last visit: Patient reports doing well on Ozempic  without concerns.  Confirms he has elected to stay with the same medicare advantage plan for 2026.  Reported DM Regimen: ?  Metformin  500 mg twice daily Ozempic  0.5 mg weekly      _______________________________________________  Objective    Review of Systems:? Limited in the setting of virtual visit GI:? No nausea, vomiting, constipation, diarrhea, abdominal pain, dyspepsia, change in bowel habits  Endocrine:? No polyuria, polyphagia or blurred vision    Physical Examination:  Vitals:  Wt Readings from Last 3 Encounters:  01/25/24 194 lb 8 oz (88.2 kg)  12/11/23 191 lb 12.8 oz (87 kg)  11/26/23 193 lb (87.5 kg)   BP Readings from Last 3 Encounters:  01/25/24 100/72  12/11/23 124/80  11/26/23 112/70   Pulse Readings from Last 3 Encounters:  01/25/24 (!) 117  11/26/23 60  08/07/23 72     Labs:?  Lab Results  Component Value Date   HGBA1C 6.4 11/26/2023   HGBA1C 6.8 (H) 08/07/2023   HGBA1C 9.6 (H) 05/02/2023   GLUCOSE 115 (H) 11/26/2023   MICRALBCREAT 49.6 (H) 08/07/2023   CREATININE 1.42 11/26/2023   CREATININE 1.53 (H) 08/24/2023   CREATININE 1.55 (H) 08/07/2023   GFR 49.16 (L) 11/26/2023   GFR 45.03 (L) 08/24/2023   GFR 44.35 (L) 08/07/2023    Lab Results  Component Value Date   CHOL 112 08/14/2022   LDLCALC 57 08/14/2022   LDLCALC 48 03/24/2021   LDLCALC 65 01/27/2020   HDL 32 (L) 08/14/2022   TRIG 125 08/14/2022   TRIG 104  03/24/2021   TRIG 101 01/27/2020   ALT 5 11/26/2023   ALT 21 07/10/2023   AST 9 11/26/2023   AST 31 07/10/2023      Chemistry      Component Value Date/Time   NA 137 11/26/2023 1057   NA 135 05/02/2023 1401   K 4.1 11/26/2023 1057   CL 99 11/26/2023 1057   CO2 31 11/26/2023 1057   BUN 15 11/26/2023 1057   BUN 18 05/02/2023 1401   CREATININE 1.42 11/26/2023 1057   CREATININE 1.26 (H) 01/26/2016 0820      Component Value Date/Time   CALCIUM 9.4 11/26/2023 1057   ALKPHOS 72 11/26/2023 1057   AST 9 11/26/2023 1057   ALT 5 11/26/2023 1057   BILITOT 1.1 11/26/2023 1057   BILITOT 1.0 08/14/2022 0958     The ASCVD Risk score (Arnett DK, et al., 2019) failed to calculate for the following reasons:   The valid total cholesterol range is 130 to 320 mg/dL  Assessment and Plan:   1. Diabetes, type 2: controlled per last A1c 6.4% (10/13) improved from 6.8. Ozempic  refills no longer covered through PAP in 2026. Patient open to trying Trulicity if approved for PAP. Suspect sufficient A1c control with this given great response on Ozempic  and A1c at goal.  [facility] PAP application for Trulicity initiated for 2026 enrollment    Follow Up Follow up with clinical pharmacist  via phone in ~3-4 weeks once Trulicity received from PAP. Reminder set 1/19 to check status of first delivery.  Patient given direct line for questions regarding medication therapy and glucose concerns   Future Appointments  Date Time Provider Department Center  02/26/2024 10:00 AM Jesse Shivers, NP [facility] [address]  02/27/2024  8:30 AM Jesse Torres CROME, PA-C [facility] None  12/12/2024  9:30 AM [facility] ANNUAL WELLNESS VISIT 1 [facility] [address]    Jesse Torres, PharmD Clinical Pharmacist [facility] [phone]  "

## 2024-02-15 ENCOUNTER — Telehealth: Payer: Self-pay | Admitting: Pharmacist

## 2024-02-15 DIAGNOSIS — E1165 Type 2 diabetes mellitus with hyperglycemia: Secondary | ICD-10-CM

## 2024-02-15 NOTE — Progress Notes (Signed)
 Patient Assistance Program (PAP) Application   Manufacturer: Secretary/administrator    (New enrollment) Medication(s): Trulicity  Patient Portion of Application:  02/15/24: Completed with patient via online enrollment tool. Submitted. Uploaded to Media Tab. [id number] Income Documentation: N/A - Electronic verification elected.  Provider Portion of Application:  02/15/24: Provider portion completed by PharmD and uploaded PCP eFax folder for signature. Clinical Pool/CMA notified.  Prescription(s): Electronic Rx sent to [facility])   Next Steps: [x]    Provider portion of application filled out and uploaded to [facility] eFax folder for review/signature []    Upon PCP signature, Application to be faxed to [facility] PAP team AND scanned to chart: [facility] PAP Team: CPhT Patient Advocate Team Fax: [phone]  Note routed to PCP Clinic Pool to ensure PCP signature is obtained and application is faxed. Med Advocate PAP Spreadsheet updated   *[facility] clinic team - Please Addend/update this note as the Next Steps are completed in office*

## 2024-02-18 DIAGNOSIS — E1165 Type 2 diabetes mellitus with hyperglycemia: Secondary | ICD-10-CM

## 2024-02-18 MED ORDER — TRULICITY 1.5 MG/0.5ML ~~LOC~~ SOAJ: 1.5000 mg | SUBCUTANEOUS | 3 refills | Status: AC

## 2024-02-18 MED ORDER — TRULICITY 0.75 MG/0.5ML ~~LOC~~ SOAJ: 0.7500 mg | SUBCUTANEOUS | 0 refills | Status: AC

## 2024-02-18 NOTE — Progress Notes (Signed)
 [facility] Application completed by PCP, faxed to [facility] PAP team.   Current Ozempic  dose 0.5 mg as maintenance dose. Well-controlled.  Lab Results  Component Value Date   HGBA1C 6.4 11/26/2023   Since on low dose of Ozempic , will plan for Trulicity  at starter dose x1 month, maintinence dose of 1.5 mg thereafter unless titration needed for A1c increase (Ozempic  0.5 mg is roughly therapeutically equivalent to Trulicity  1.5 mg).  eRx for Trulicity  0.75 x1 month eRx for Trulicity  1.5 mg maintenance dose  [facility] = [facility] pharmacy.

## 2024-02-18 NOTE — Telephone Encounter (Signed)
 Form placed in [place] inbox to sign; will fax once done.

## 2024-02-18 NOTE — Telephone Encounter (Signed)
 Form has been signed and faxed to number provided below.

## 2024-02-20 NOTE — Telephone Encounter (Signed)
 Received approval letter from [facility] (Trulicity ) thru 02/12/2025,approval letter index left a HIPAA VM at pt number.

## 2024-02-21 NOTE — Progress Notes (Unsigned)
 "  Cardiology Office Note    Date:  02/21/2024   ID:  Jesse Torres, DOB Mar 11, 1950, MRN [id number]  PCP:  Vincente Shivers, NP  Cardiologist:  None  Electrophysiologist:  None   Chief Complaint: ***  History of Present Illness:   Jesse Torres is a 74 y.o. 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    Patient was admitted 02/2013 with volume overload for presumed HTN cardiomyopathy. Initial echo 01/2013 showing LVEF 25-30%. Nuclear stress testing at that time without ischemia. He has been compliant with GDMT with improved EF 60-65% in 04/2014. He was most recently seen 08/14/2022 for routine follow up and was doing well from a cardiac standpoint with NYHA class I/II symptoms. No medication changes were made. Echo 08/2022 showed LVEF 60-65%. A1C was checked and within range for diagnosis of type 2 diabetes mellitus. He was started on metformin  and encouraged to follow up with PCP for further management.     Patient was most recently seen in the cardiology clinic with Dr. Darron 01/25/2024 and overall feeling well from a cardiac perspective.  He was incidentally noted to be in atrial fibrillation with RVR, rate 117 bpm.  CHA2DS2-VASc of 4 for which he was subsequently started on Eliquis  5 mg twice daily.  His amlodipine  and carvedilol  were discontinued and he was started on metoprolol  tartrate 50 mg twice daily.  ***  Labs independently reviewed: 11/2023-sodium 137, potassium 4.1, BUN 15, creatinine 1.42, normal LFTs, A1c 6.4 07/2023-platelets 142, Hgb 11.9, HCT 36.6 08/2022-TC 112, TG 125, LDL 57, HDL 32  Objective   Past Medical History:  Diagnosis Date   Cardiomyopathy (HCC)    probable HTN cardiomyopathy;  Lexiscan  Myoview (02/07/2013): EF  34%, old small apical infarct, no ischemia, moderate apical hypokinesis.   Chronic systolic CHF (congestive heart failure) (HCC)    a. Echocardiogram (02/05/2013): Mild LVH, EF 25-30%, diffuse HK, mild MR, mild LAE, mild RVE, moderately reduced RVSF, mild RAE, PASP 64, trivial effusion.;  b.  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   Social History   Socioeconomic History   Marital status: Married    Spouse name: Not on file   Number of children: Not on file   Years of education: Not on file   Highest education level: Not on file  Occupational History   Not on file  Tobacco Use   Smoking status: Former    Current packs/day: 0.00    Types: Cigarettes    Quit date: 02/06/1995    Years since quitting: 29.0   Smokeless tobacco: Never  Vaping Use   Vaping status:  Unknown  Substance and Sexual Activity   Alcohol use: No   Drug use: No   Sexual activity: Not Currently  Other Topics Concern   Not on file  Social History Narrative   Not on file   Social Drivers of Health   Tobacco Use: Medium Risk (01/25/2024)   Patient History    Smoking Tobacco Use: Former    Smokeless Tobacco Use: Never    Passive Exposure: Not on file  Financial Resource Strain: Low Risk (12/11/2023)   Overall Financial Resource Strain (CARDIA)    Difficulty of Paying  Living Expenses: Not hard at all  Food Insecurity: No Food Insecurity (12/11/2023)   Epic    Worried About Radiation Protection Practitioner of Food in the Last Year: Never true    Ran Out of Food in the Last Year: Never true  Transportation Needs: No Transportation Needs (12/11/2023)   Epic    Lack of Transportation (Medical): No    Lack of Transportation (Non-Medical): No  Physical Activity: Inactive (12/11/2023)   Exercise Vital Sign    Days of Exercise per Week: 0 days    Minutes of Exercise per Session: 0 min  Stress: No Stress Concern Present (12/11/2023)   [employer] of Occupational Health - Occupational Stress Questionnaire    Feeling of Stress: Not at all  Social Connections: Moderately Isolated (12/11/2023)   Social Connection and Isolation Panel    Frequency of Communication with Friends and Family: More than three times a week    Frequency of Social Gatherings with Friends and Family: More than three times a week    Attends Religious Services: 1 to 4 times per year    Active Member of Clubs or Organizations: No    Attends Banker Meetings: Never    Marital Status: Divorced  Depression (PHQ2-9): Low Risk (12/11/2023)   Depression (PHQ2-9)    PHQ-2 Score: 0  Alcohol Screen: Low Risk (12/11/2023)   Alcohol Screen    Last Alcohol Screening Score (AUDIT): 0  Housing: Low Risk (12/11/2023)   Epic    Unable to Pay for Housing in the Last Year: No    Number of Times Moved in the Last Year: 0    Homeless in the Last Year: No  Utilities: Not At Risk (12/11/2023)   Epic    Threatened with loss of utilities: No  Health Literacy: Adequate Health Literacy (12/11/2023)   B1300 Health Literacy    Frequency of need for help with medical instructions: Never     Family History:  The patient's family history includes Heart attack in his mother; Stroke in his father.  ROS:   12-point review of systems is negative unless otherwise noted in the HPI.  EKGs/Other Studies Reviewed:     Studies reviewed were summarized above. The additional studies were reviewed today:  06/2023 2D echo 1. Left ventricular ejection fraction, by estimation, is 55 to 60%. The  left ventricle has normal function. Left ventricular endocardial border  not optimally defined to evaluate regional wall motion. Left ventricular  diastolic parameters are  indeterminate.   2. Right ventricular systolic function is normal. The right ventricular  size is normal. There is normal pulmonary artery systolic pressure.   3. The mitral valve is normal in structure. No evidence of mitral valve  regurgitation. No evidence of mitral stenosis.   4. The aortic valve is normal in structure. Aortic valve regurgitation is  not visualized. No aortic stenosis is present.   5. The inferior  vena cava is normal in size with greater than 50%  respiratory variability, suggesting right atrial pressure of 3 mmHg.   EKG:  EKG personally reviewed by me today    PHYSICAL EXAM:    VS:  There were no vitals taken for this visit.  BMI: There is no height or weight on file to calculate BMI.  GEN: Well nourished, well developed in no acute distress NECK: No JVD; No carotid bruits CARDIAC: ***RRR, no murmurs, rubs, gallops RESPIRATORY:  Clear to auscultation without rales, wheezing or rhonchi  ABDOMEN: Soft, non-tender, non-distended EXTREMITIES:  *** No edema; No deformity  Wt Readings from Last 3 Encounters:  01/25/24 194 lb 8 oz (88.2 kg)  12/11/23 191 lb 12.8 oz (87 kg)  11/26/23 193 lb (87.5 kg)                  ASSESSMENT & PLAN:   Newly diagnosed atrial fibrillation   Heart failure with improved EF   Essential hypertension   CKD   Hyperlipidemia    {Are you ordering a CV Procedure (e.g. stress test, cath, DCCV, TEE, etc)?   Press F2        :[id number]}   Disposition: F/u with Dr. Darron or an APP in ***.   Medication Adjustments/Labs and Tests Ordered: Current medicines are reviewed at  length with the patient today.  Concerns regarding medicines are outlined above. Medication changes, Labs and Tests ordered today are summarized above and listed in the Patient Instructions accessible in Encounters.   Bonney Lesley Maffucci, PA-C 02/21/2024 3:17 PM     [facility] -  [address] [place], [place] [address] [phone]      [1]  No outpatient medications have been marked as taking for the 02/27/24 encounter (Appointment) with Maffucci Lesley CROME, PA-C.   "

## 2024-02-26 ENCOUNTER — Ambulatory Visit (INDEPENDENT_AMBULATORY_CARE_PROVIDER_SITE_OTHER): Payer: Self-pay | Admitting: General Practice

## 2024-02-26 DIAGNOSIS — Z91199 Patient's noncompliance with other medical treatment and regimen due to unspecified reason: Secondary | ICD-10-CM

## 2024-02-26 DIAGNOSIS — E1165 Type 2 diabetes mellitus with hyperglycemia: Secondary | ICD-10-CM

## 2024-02-26 DIAGNOSIS — I10 Essential (primary) hypertension: Secondary | ICD-10-CM

## 2024-02-26 DIAGNOSIS — N1831 Chronic kidney disease, stage 3a: Secondary | ICD-10-CM

## 2024-02-26 NOTE — Progress Notes (Signed)
 Patient no-showed today's appointment.

## 2024-02-27 ENCOUNTER — Ambulatory Visit: Attending: Physician Assistant | Admitting: Physician Assistant

## 2024-02-27 ENCOUNTER — Telehealth: Payer: Self-pay | Admitting: Cardiovascular Disease

## 2024-02-27 VITALS — BP 124/60 | HR 85 | Ht 67.0 in | Wt 200.0 lb

## 2024-02-27 DIAGNOSIS — I10 Essential (primary) hypertension: Secondary | ICD-10-CM | POA: Diagnosis not present

## 2024-02-27 DIAGNOSIS — I4819 Other persistent atrial fibrillation: Secondary | ICD-10-CM | POA: Diagnosis not present

## 2024-02-27 DIAGNOSIS — I4891 Unspecified atrial fibrillation: Secondary | ICD-10-CM

## 2024-02-27 DIAGNOSIS — E785 Hyperlipidemia, unspecified: Secondary | ICD-10-CM | POA: Diagnosis not present

## 2024-02-27 DIAGNOSIS — I5020 Unspecified systolic (congestive) heart failure: Secondary | ICD-10-CM

## 2024-02-27 MED ORDER — APIXABAN 5 MG PO TABS: 5.0000 mg | ORAL_TABLET | Freq: Two times a day (BID) | ORAL | 3 refills | Status: AC

## 2024-02-27 NOTE — Patient Instructions (Addendum)
 Medication Instructions:  Your physician recommends that you continue on your current medications as directed. Please refer to the Current Medication list given to you today.  *If you need a refill on your cardiac medications before your next appointment, please call your pharmacy*  Lab Work: No labs ordered today  If you have labs (blood work) drawn today and your tests are completely normal, you will receive your results only by: MyChart Message (if you have MyChart) OR A paper copy in the mail If you have any lab test that is abnormal or we need to change your treatment, we will call you to review the results.  Testing/Procedures: No test ordered today   Follow-Up: At [facility], you and your health needs are our priority.  As part of our continuing mission to provide you with exceptional heart care, our providers are all part of one team.  This team includes your primary Cardiologist (physician) and Advanced Practice Providers or APPs (Physician Assistants and Nurse Practitioners) who all work together to provide you with the care you need, when you need it.  Your next appointment:   1 month(s)  Provider:   You may see Dr. Darron  or one of the following Advanced Practice Providers on your designated Care Team:   Lonni Meager, NP Lesley Maffucci, PA-C Bernardino Bring, PA-C Cadence Virden, PA-C Tylene Lunch, NP Barnie Hila, NP    We recommend signing up for the patient portal called MyChart.  Sign up information is provided on this After Visit Summary.  MyChart is used to connect with patients for Virtual Visits (Telemedicine).  Patients are able to view lab/test results, encounter notes, upcoming appointments, etc.  Non-urgent messages can be sent to your provider as well.   To learn more about what you can do with MyChart, go to [email or web address].

## 2024-02-27 NOTE — Telephone Encounter (Signed)
 Pt daughter Rexene will fax over pts current meds list.

## 2024-03-03 ENCOUNTER — Telehealth: Payer: Self-pay | Admitting: Physician Assistant

## 2024-03-03 NOTE — Telephone Encounter (Signed)
 Pt c/o medication issue:  1. Name of Medication: apixaban  (ELIQUIS ) 5 MG TABS tablet   2. How are you currently taking this medication (dosage and times per day)? As prescribed   3. Are you having a reaction (difficulty breathing--STAT)? No   4. What is your medication issue? Pts Rx will cost $300 and he cannot afford. He is asking for an alternative medication or c/b with patient assistance info. Please advise.

## 2024-03-03 NOTE — Telephone Encounter (Signed)
 Spoke with patient regarding eliquis . Patient was prescribed eliquis  last month but never received it through mail pharmacy, he has not been on anticoagulation at all while being in a fib. He states even if this is a co-pay or deductible he cannot afford it with his social security. Will send a message over to Lesley Maffucci, PA-C for further review and recommendation on what he should do.

## 2024-03-04 ENCOUNTER — Telehealth: Payer: Self-pay | Admitting: Pharmacy Technician

## 2024-03-04 ENCOUNTER — Other Ambulatory Visit (HOSPITAL_COMMUNITY): Payer: Self-pay

## 2024-03-04 NOTE — Telephone Encounter (Signed)
 Patient Advocate Encounter   The patient was approved for a [employer] grant that will help cover the cost of eliquis  Total amount awarded, 7500.  Effective: 02/03/24 - 02/01/25   APW:[id number] ERW:[id number] Group:[id number] PI:[id number] [employer] ID: [id number]   Pharmacy provided with approval and processing information. Patient informed via telephone-he did not answer

## 2024-03-12 ENCOUNTER — Other Ambulatory Visit (HOSPITAL_COMMUNITY): Payer: Self-pay

## 2024-03-17 ENCOUNTER — Other Ambulatory Visit: Payer: Self-pay

## 2024-03-17 NOTE — Telephone Encounter (Signed)
 Pt mailed information regarding eliquis  grant. Patient advised to request eliquis  refill through [place] pharmacy for next round of fills for the grant to apply.

## 2024-03-31 ENCOUNTER — Ambulatory Visit: Admitting: Physician Assistant

## 2024-04-07 ENCOUNTER — Ambulatory Visit: Admitting: Physician Assistant

## 2024-12-12 ENCOUNTER — Ambulatory Visit
